# Patient Record
Sex: Male | Born: 1945
Health system: Southern US, Community
[De-identification: ages and names within clinical notes are randomized; demographics above are authoritative.]

## PROBLEM LIST (undated history)

## (undated) DIAGNOSIS — G43009 Migraine without aura, not intractable, without status migrainosus: Secondary | ICD-10-CM

## (undated) DIAGNOSIS — M7989 Other specified soft tissue disorders: Secondary | ICD-10-CM

## (undated) DIAGNOSIS — N320 Bladder-neck obstruction: Secondary | ICD-10-CM

## (undated) DIAGNOSIS — Z86010 Personal history of colonic polyps: Secondary | ICD-10-CM

## (undated) DIAGNOSIS — I517 Cardiomegaly: Secondary | ICD-10-CM

## (undated) DIAGNOSIS — D751 Secondary polycythemia: Secondary | ICD-10-CM

## (undated) DIAGNOSIS — R5383 Other fatigue: Secondary | ICD-10-CM

## (undated) DIAGNOSIS — R5381 Other malaise: Secondary | ICD-10-CM

## (undated) DIAGNOSIS — R7302 Impaired glucose tolerance (oral): Secondary | ICD-10-CM

## (undated) DIAGNOSIS — M79609 Pain in unspecified limb: Secondary | ICD-10-CM

## (undated) DIAGNOSIS — E785 Hyperlipidemia, unspecified: Secondary | ICD-10-CM

## (undated) DIAGNOSIS — M1990 Unspecified osteoarthritis, unspecified site: Secondary | ICD-10-CM

## (undated) DIAGNOSIS — N183 Chronic kidney disease, stage 3 (moderate): Secondary | ICD-10-CM

## (undated) DIAGNOSIS — F528 Other sexual dysfunction not due to a substance or known physiological condition: Secondary | ICD-10-CM

## (undated) DIAGNOSIS — D759 Disease of blood and blood-forming organs, unspecified: Secondary | ICD-10-CM

## (undated) DIAGNOSIS — I10 Essential (primary) hypertension: Secondary | ICD-10-CM

## (undated) DIAGNOSIS — K219 Gastro-esophageal reflux disease without esophagitis: Secondary | ICD-10-CM

## (undated) DIAGNOSIS — R351 Nocturia: Secondary | ICD-10-CM

## (undated) HISTORY — PX: APPENDECTOMY: SHX54

## (undated) HISTORY — DX: Essential (primary) hypertension: I10

## (undated) HISTORY — DX: Personal history of colonic polyps: Z86.010

## (undated) HISTORY — DX: Hyperlipidemia, unspecified: E78.5

## (undated) HISTORY — DX: Bladder-neck obstruction: N32.0

## (undated) HISTORY — PX: COLONOSCOPY: SHX174

## (undated) HISTORY — DX: Other fatigue: R53.83

## (undated) HISTORY — DX: Secondary polycythemia: D75.1

## (undated) HISTORY — DX: Other specified soft tissue disorders: M79.89

## (undated) HISTORY — PX: CIRCUMCISION: SUR203

## (undated) HISTORY — DX: Impaired glucose tolerance (oral): R73.02

## (undated) HISTORY — DX: Other sexual dysfunction not due to a substance or known physiological condition: F52.8

## (undated) HISTORY — DX: Other malaise: R53.81

## (undated) HISTORY — DX: Cardiomegaly: I51.7

## (undated) HISTORY — DX: Pain in unspecified limb: M79.609

## (undated) HISTORY — DX: Nocturia: R35.1

## (undated) HISTORY — DX: Unspecified osteoarthritis, unspecified site: M19.90

## (undated) HISTORY — DX: Chronic kidney disease, stage 3 (moderate): N18.3

## (undated) HISTORY — PX: TONSILLECTOMY: SUR1361

## (undated) HISTORY — PX: UPPER GASTROINTESTINAL ENDOSCOPY: SHX188

## (undated) HISTORY — DX: Disease of blood and blood-forming organs, unspecified: D75.9

## (undated) HISTORY — DX: Migraine without aura, not intractable, without status migrainosus: G43.009

## (undated) HISTORY — PX: POLYPECTOMY: SHX149

## (undated) HISTORY — DX: Gastro-esophageal reflux disease without esophagitis: K21.9

## (undated) HISTORY — PX: OTHER SURGICAL HISTORY: SHX169

---

## 1999-02-17 ENCOUNTER — Emergency Department (HOSPITAL_COMMUNITY): Admission: EM | Admit: 1999-02-17 | Discharge: 1999-02-17 | Payer: Self-pay | Admitting: Emergency Medicine

## 1999-02-17 ENCOUNTER — Encounter: Payer: Self-pay | Admitting: Emergency Medicine

## 2003-06-28 ENCOUNTER — Encounter: Payer: Self-pay | Admitting: General Surgery

## 2003-06-30 ENCOUNTER — Ambulatory Visit (HOSPITAL_COMMUNITY): Admission: RE | Admit: 2003-06-30 | Discharge: 2003-06-30 | Payer: Self-pay | Admitting: General Surgery

## 2004-08-15 ENCOUNTER — Ambulatory Visit: Payer: Self-pay | Admitting: Internal Medicine

## 2004-08-23 ENCOUNTER — Ambulatory Visit: Payer: Self-pay | Admitting: Internal Medicine

## 2004-08-24 ENCOUNTER — Ambulatory Visit: Payer: Self-pay | Admitting: Internal Medicine

## 2005-01-09 ENCOUNTER — Ambulatory Visit: Payer: Self-pay | Admitting: Internal Medicine

## 2007-03-04 ENCOUNTER — Ambulatory Visit: Payer: Self-pay | Admitting: Internal Medicine

## 2007-03-04 LAB — CONVERTED CEMR LAB
ALT: 27 units/L (ref 0–40)
AST: 23 units/L (ref 0–37)
Albumin: 4.2 g/dL (ref 3.5–5.2)
Alkaline Phosphatase: 61 units/L (ref 39–117)
BUN: 11 mg/dL (ref 6–23)
Basophils Absolute: 0 10*3/uL (ref 0.0–0.1)
Basophils Relative: 0.5 % (ref 0.0–1.0)
Bilirubin Urine: NEGATIVE
Bilirubin, Direct: 0.1 mg/dL (ref 0.0–0.3)
CO2: 30 meq/L (ref 19–32)
Calcium: 9.8 mg/dL (ref 8.4–10.5)
Chloride: 109 meq/L (ref 96–112)
Cholesterol: 243 mg/dL (ref 0–200)
Creatinine, Ser: 1.1 mg/dL (ref 0.4–1.5)
Direct LDL: 134 mg/dL
Eosinophils Absolute: 0.3 10*3/uL (ref 0.0–0.6)
Eosinophils Relative: 3.1 % (ref 0.0–5.0)
GFR calc Af Amer: 87 mL/min
GFR calc non Af Amer: 72 mL/min
Glucose, Bld: 100 mg/dL — ABNORMAL HIGH (ref 70–99)
HCT: 41.6 % (ref 39.0–52.0)
HDL: 53.3 mg/dL (ref >39.0)
Hemoglobin: 14 g/dL (ref 13.0–17.0)
Ketones, ur: NEGATIVE mg/dL
Leukocytes, UA: NEGATIVE
Lymphocytes Relative: 21.9 % (ref 12.0–46.0)
MCHC: 33.6 g/dL (ref 30.0–36.0)
MCV: 82.3 fL (ref 78.0–100.0)
Monocytes Absolute: 0.6 10*3/uL (ref 0.2–0.7)
Monocytes Relative: 6.4 % (ref 3.0–11.0)
Neutro Abs: 6.6 10*3/uL (ref 1.4–7.7)
Neutrophils Relative %: 68.1 % (ref 43.0–77.0)
Nitrite: NEGATIVE
PSA: 0.72 ng/mL (ref 0.10–4.00)
Platelets: 506 10*3/uL — ABNORMAL HIGH (ref 150–400)
Potassium: 4 meq/L (ref 3.5–5.1)
RBC: 5.06 M/uL (ref 4.22–5.81)
RDW: 14.9 % — ABNORMAL HIGH (ref 11.5–14.6)
Sodium: 141 meq/L (ref 135–145)
Specific Gravity, Urine: >=1.03 (ref 1.000–1.03)
TSH: 1.8 microintl units/mL (ref 0.35–5.50)
Total Bilirubin: 0.5 mg/dL (ref 0.3–1.2)
Total CHOL/HDL Ratio: 4.6
Total Protein, Urine: NEGATIVE mg/dL
Total Protein: 7.2 g/dL (ref 6.0–8.3)
Triglycerides: 264 mg/dL (ref 0–149)
Urine Glucose: NEGATIVE mg/dL
Urobilinogen, UA: 0.2 (ref 0.0–1.0)
VLDL: 53 mg/dL — ABNORMAL HIGH (ref 0–40)
WBC: 9.6 10*3/uL (ref 4.5–10.5)
pH: 6 (ref 5.0–8.0)

## 2008-11-15 ENCOUNTER — Ambulatory Visit: Payer: Self-pay | Admitting: Internal Medicine

## 2008-11-15 DIAGNOSIS — R5381 Other malaise: Secondary | ICD-10-CM

## 2008-11-15 DIAGNOSIS — F528 Other sexual dysfunction not due to a substance or known physiological condition: Secondary | ICD-10-CM | POA: Insufficient documentation

## 2008-11-15 DIAGNOSIS — E785 Hyperlipidemia, unspecified: Secondary | ICD-10-CM

## 2008-11-15 DIAGNOSIS — K219 Gastro-esophageal reflux disease without esophagitis: Secondary | ICD-10-CM

## 2008-11-15 DIAGNOSIS — R5383 Other fatigue: Secondary | ICD-10-CM

## 2008-11-15 HISTORY — DX: Gastro-esophageal reflux disease without esophagitis: K21.9

## 2008-11-15 HISTORY — DX: Other sexual dysfunction not due to a substance or known physiological condition: F52.8

## 2008-11-15 HISTORY — DX: Hyperlipidemia, unspecified: E78.5

## 2008-11-15 HISTORY — DX: Other malaise: R53.81

## 2008-12-19 ENCOUNTER — Ambulatory Visit: Payer: Self-pay | Admitting: Internal Medicine

## 2008-12-19 ENCOUNTER — Ambulatory Visit: Payer: Self-pay | Admitting: Gastroenterology

## 2008-12-19 DIAGNOSIS — D759 Disease of blood and blood-forming organs, unspecified: Secondary | ICD-10-CM

## 2008-12-19 DIAGNOSIS — R351 Nocturia: Secondary | ICD-10-CM

## 2008-12-19 DIAGNOSIS — D473 Essential (hemorrhagic) thrombocythemia: Secondary | ICD-10-CM

## 2008-12-19 HISTORY — DX: Disease of blood and blood-forming organs, unspecified: D75.9

## 2008-12-19 HISTORY — DX: Nocturia: R35.1

## 2008-12-20 ENCOUNTER — Encounter: Payer: Self-pay | Admitting: Internal Medicine

## 2008-12-20 LAB — CONVERTED CEMR LAB
ALT: 24 units/L (ref 0–53)
AST: 21 units/L (ref 0–37)
Albumin: 4.4 g/dL (ref 3.5–5.2)
Alkaline Phosphatase: 53 units/L (ref 39–117)
Basophils Absolute: 0.1 10*3/uL (ref 0.0–0.1)
Basophils Relative: 0.6 % (ref 0.0–3.0)
Bilirubin Urine: NEGATIVE
Bilirubin, Direct: 0.1 mg/dL (ref 0.0–0.3)
Cholesterol: 189 mg/dL (ref 0–200)
Eosinophils Absolute: 0.3 10*3/uL (ref 0.0–0.7)
Eosinophils Relative: 2.9 % (ref 0.0–5.0)
HCT: 43.6 % (ref 39.0–52.0)
HDL: 57.2 mg/dL (ref >39.00)
Hemoglobin, Urine: NEGATIVE
Hemoglobin: 14.4 g/dL (ref 13.0–17.0)
Ketones, ur: NEGATIVE mg/dL
LDL Cholesterol: 108 mg/dL — ABNORMAL HIGH (ref 0–99)
Leukocytes, UA: NEGATIVE
Lymphocytes Relative: 22.3 % (ref 12.0–46.0)
Lymphs Abs: 2.1 10*3/uL (ref 0.7–4.0)
MCHC: 33 g/dL (ref 30.0–36.0)
MCV: 84.6 fL (ref 78.0–100.0)
Monocytes Absolute: 0.6 10*3/uL (ref 0.1–1.0)
Monocytes Relative: 6.3 % (ref 3.0–12.0)
Neutro Abs: 6.5 10*3/uL (ref 1.4–7.7)
Neutrophils Relative %: 67.9 % (ref 43.0–77.0)
Nitrite: NEGATIVE
Platelets: 631 10*3/uL — ABNORMAL HIGH (ref 150.0–400.0)
RBC: 5.15 M/uL (ref 4.22–5.81)
RDW: 15.4 % — ABNORMAL HIGH (ref 11.5–14.6)
Specific Gravity, Urine: 1.01 (ref 1.000–1.030)
Total Bilirubin: 0.8 mg/dL (ref 0.3–1.2)
Total CHOL/HDL Ratio: 3
Total Protein, Urine: NEGATIVE mg/dL
Total Protein: 7.4 g/dL (ref 6.0–8.3)
Triglycerides: 119 mg/dL (ref 0.0–149.0)
Urine Glucose: NEGATIVE mg/dL
Urobilinogen, UA: 0.2 (ref 0.0–1.0)
VLDL: 23.8 mg/dL (ref 0.0–40.0)
WBC: 9.6 10*3/uL (ref 4.5–10.5)
pH: 6 (ref 5.0–8.0)

## 2008-12-22 ENCOUNTER — Ambulatory Visit: Payer: Self-pay | Admitting: Oncology

## 2008-12-23 ENCOUNTER — Encounter: Payer: Self-pay | Admitting: Internal Medicine

## 2008-12-23 LAB — CBC WITH DIFFERENTIAL/PLATELET
BASO%: 0.4 % (ref 0.0–2.0)
LYMPH%: 21.7 % (ref 14.0–49.0)
MCHC: 32.3 g/dL (ref 32.0–36.0)
MONO#: 0.6 10*3/uL (ref 0.1–0.9)
Platelets: 703 10*3/uL — ABNORMAL HIGH (ref 140–400)
RBC: 5.14 10*6/uL (ref 4.20–5.82)
RDW: 16.6 % — ABNORMAL HIGH (ref 11.0–14.6)
WBC: 10.6 10*3/uL — ABNORMAL HIGH (ref 4.0–10.3)
lymph#: 2.3 10*3/uL (ref 0.9–3.3)

## 2008-12-23 LAB — CHCC SMEAR

## 2008-12-28 LAB — COMPREHENSIVE METABOLIC PANEL
ALT: 25 U/L (ref 0–53)
AST: 21 U/L (ref 0–37)
Calcium: 10.3 mg/dL (ref 8.4–10.5)
Chloride: 102 mEq/L (ref 96–112)
Creatinine, Ser: 0.96 mg/dL (ref 0.40–1.50)

## 2009-01-02 ENCOUNTER — Encounter: Payer: Self-pay | Admitting: Gastroenterology

## 2009-01-02 ENCOUNTER — Encounter: Payer: Self-pay | Admitting: Internal Medicine

## 2009-01-02 ENCOUNTER — Ambulatory Visit: Payer: Self-pay | Admitting: Gastroenterology

## 2009-01-02 LAB — HM COLONOSCOPY

## 2009-01-05 ENCOUNTER — Encounter: Payer: Self-pay | Admitting: Gastroenterology

## 2009-01-31 ENCOUNTER — Ambulatory Visit: Payer: Self-pay | Admitting: Oncology

## 2009-02-02 ENCOUNTER — Encounter: Payer: Self-pay | Admitting: Internal Medicine

## 2009-02-02 LAB — CBC WITH DIFFERENTIAL/PLATELET
Basophils Absolute: 0 10*3/uL (ref 0.0–0.1)
Eosinophils Absolute: 0.3 10*3/uL (ref 0.0–0.5)
HGB: 14.6 g/dL (ref 13.0–17.1)
LYMPH%: 21.9 % (ref 14.0–49.0)
MCH: 27.6 pg (ref 27.2–33.4)
MCV: 84.7 fL (ref 79.3–98.0)
MONO%: 4.7 % (ref 0.0–14.0)
NEUT#: 7.1 10*3/uL — ABNORMAL HIGH (ref 1.5–6.5)
Platelets: 710 10*3/uL — ABNORMAL HIGH (ref 140–400)
RBC: 5.3 10*6/uL (ref 4.20–5.82)

## 2009-02-02 LAB — IRON AND TIBC
%SAT: 19 % — ABNORMAL LOW (ref 20–55)
TIBC: 303 ug/dL (ref 215–435)
UIBC: 245 ug/dL

## 2009-02-02 LAB — COMPREHENSIVE METABOLIC PANEL
Alkaline Phosphatase: 55 U/L (ref 39–117)
BUN: 12 mg/dL (ref 6–23)
Glucose, Bld: 95 mg/dL (ref 70–99)
Total Bilirubin: 0.5 mg/dL (ref 0.3–1.2)

## 2009-02-02 LAB — FERRITIN: Ferritin: 119 ng/mL (ref 22–322)

## 2009-04-11 ENCOUNTER — Ambulatory Visit: Payer: Self-pay | Admitting: Oncology

## 2009-04-14 ENCOUNTER — Encounter: Payer: Self-pay | Admitting: Internal Medicine

## 2009-04-14 LAB — CBC WITH DIFFERENTIAL/PLATELET
Basophils Absolute: 0 10*3/uL (ref 0.0–0.1)
Eosinophils Absolute: 0.3 10*3/uL (ref 0.0–0.5)
HCT: 45.3 % (ref 38.4–49.9)
HGB: 14.6 g/dL (ref 13.0–17.1)
MCV: 84.1 fL (ref 79.3–98.0)
MONO%: 5.8 % (ref 0.0–14.0)
NEUT#: 7.2 10*3/uL — ABNORMAL HIGH (ref 1.5–6.5)
NEUT%: 69.5 % (ref 39.0–75.0)
RDW: 15.4 % — ABNORMAL HIGH (ref 11.0–14.6)

## 2009-10-06 ENCOUNTER — Ambulatory Visit: Payer: Self-pay | Admitting: Oncology

## 2009-10-19 ENCOUNTER — Encounter: Payer: Self-pay | Admitting: Internal Medicine

## 2009-10-19 LAB — CBC WITH DIFFERENTIAL/PLATELET
Basophils Absolute: 0 10*3/uL (ref 0.0–0.1)
Eosinophils Absolute: 0.3 10*3/uL (ref 0.0–0.5)
HGB: 16 g/dL (ref 13.0–17.1)
MCV: 86.3 fL (ref 79.3–98.0)
MONO#: 0.5 10*3/uL (ref 0.1–0.9)
MONO%: 4.1 % (ref 0.0–14.0)
NEUT#: 9.9 10*3/uL — ABNORMAL HIGH (ref 1.5–6.5)
RBC: 5.73 10*6/uL (ref 4.20–5.82)
RDW: 16.4 % — ABNORMAL HIGH (ref 11.0–14.6)
WBC: 12.6 10*3/uL — ABNORMAL HIGH (ref 4.0–10.3)
lymph#: 1.8 10*3/uL (ref 0.9–3.3)

## 2009-11-20 ENCOUNTER — Ambulatory Visit: Payer: Self-pay | Admitting: Oncology

## 2009-11-20 ENCOUNTER — Ambulatory Visit (HOSPITAL_COMMUNITY): Admission: RE | Admit: 2009-11-20 | Discharge: 2009-11-20 | Payer: Self-pay | Admitting: Oncology

## 2009-11-21 ENCOUNTER — Ambulatory Visit: Payer: Self-pay | Admitting: Oncology

## 2009-11-24 ENCOUNTER — Encounter: Payer: Self-pay | Admitting: Internal Medicine

## 2009-11-29 ENCOUNTER — Ambulatory Visit: Payer: Self-pay | Admitting: Internal Medicine

## 2010-02-26 ENCOUNTER — Ambulatory Visit: Payer: Self-pay | Admitting: Oncology

## 2010-02-28 ENCOUNTER — Encounter: Payer: Self-pay | Admitting: Internal Medicine

## 2010-02-28 LAB — COMPREHENSIVE METABOLIC PANEL
Albumin: 4.5 g/dL (ref 3.5–5.2)
Alkaline Phosphatase: 57 U/L (ref 39–117)
BUN: 9 mg/dL (ref 6–23)
CO2: 29 mEq/L (ref 19–32)
Calcium: 9.9 mg/dL (ref 8.4–10.5)
Chloride: 101 mEq/L (ref 96–112)
Glucose, Bld: 96 mg/dL (ref 70–99)
Potassium: 4.1 mEq/L (ref 3.5–5.3)
Sodium: 136 mEq/L (ref 135–145)
Total Protein: 7.8 g/dL (ref 6.0–8.3)

## 2010-02-28 LAB — CBC WITH DIFFERENTIAL/PLATELET
Basophils Absolute: 0 10*3/uL (ref 0.0–0.1)
Eosinophils Absolute: 0.3 10*3/uL (ref 0.0–0.5)
HGB: 15.8 g/dL (ref 13.0–17.1)
MCV: 84.8 fL (ref 79.3–98.0)
MONO#: 0.7 10*3/uL (ref 0.1–0.9)
MONO%: 6.1 % (ref 0.0–14.0)
NEUT#: 8.1 10*3/uL — ABNORMAL HIGH (ref 1.5–6.5)
RBC: 5.71 10*6/uL (ref 4.20–5.82)
RDW: 16.5 % — ABNORMAL HIGH (ref 11.0–14.6)
WBC: 11.7 10*3/uL — ABNORMAL HIGH (ref 4.0–10.3)

## 2010-06-19 ENCOUNTER — Ambulatory Visit: Payer: Self-pay | Admitting: Oncology

## 2010-06-21 ENCOUNTER — Encounter: Payer: Self-pay | Admitting: Internal Medicine

## 2010-06-21 LAB — CBC WITH DIFFERENTIAL/PLATELET
BASO%: 0.2 % (ref 0.0–2.0)
Basophils Absolute: 0 10*3/uL (ref 0.0–0.1)
EOS%: 3.4 % (ref 0.0–7.0)
Eosinophils Absolute: 0.4 10*3/uL (ref 0.0–0.5)
HCT: 48.3 % (ref 38.4–49.9)
HGB: 15.7 g/dL (ref 13.0–17.1)
LYMPH%: 25 % (ref 14.0–49.0)
MCH: 27.8 pg (ref 27.2–33.4)
MCHC: 32.5 g/dL (ref 32.0–36.0)
MCV: 85.6 fL (ref 79.3–98.0)
MONO#: 0.8 10*3/uL (ref 0.1–0.9)
MONO%: 6.6 % (ref 0.0–14.0)
NEUT#: 8.1 10*3/uL — ABNORMAL HIGH (ref 1.5–6.5)
NEUT%: 64.8 % (ref 39.0–75.0)
Platelets: 682 10*3/uL — ABNORMAL HIGH (ref 140–400)
RBC: 5.64 10*6/uL (ref 4.20–5.82)
RDW: 15.8 % — ABNORMAL HIGH (ref 11.0–14.6)
WBC: 12.5 10*3/uL — ABNORMAL HIGH (ref 4.0–10.3)
lymph#: 3.1 10*3/uL (ref 0.9–3.3)
nRBC: 0 % (ref 0–0)

## 2010-06-21 LAB — COMPREHENSIVE METABOLIC PANEL
Albumin: 4.1 g/dL (ref 3.5–5.2)
Alkaline Phosphatase: 60 U/L (ref 39–117)
BUN: 18 mg/dL (ref 6–23)
CO2: 29 mEq/L (ref 19–32)
Glucose, Bld: 92 mg/dL (ref 70–99)
Potassium: 4.2 mEq/L (ref 3.5–5.3)
Total Bilirubin: 1.2 mg/dL (ref 0.3–1.2)

## 2010-07-05 ENCOUNTER — Encounter: Payer: Self-pay | Admitting: Internal Medicine

## 2010-07-09 ENCOUNTER — Encounter: Payer: Self-pay | Admitting: Internal Medicine

## 2010-07-30 ENCOUNTER — Telehealth: Payer: Self-pay | Admitting: Internal Medicine

## 2010-08-06 ENCOUNTER — Ambulatory Visit: Payer: Self-pay | Admitting: Internal Medicine

## 2010-08-06 DIAGNOSIS — G43009 Migraine without aura, not intractable, without status migrainosus: Secondary | ICD-10-CM

## 2010-08-06 HISTORY — DX: Migraine without aura, not intractable, without status migrainosus: G43.009

## 2010-10-14 LAB — CONVERTED CEMR LAB
ALT: 19 units/L (ref 0–53)
ALT: 34 units/L (ref 0–53)
AST: 18 units/L (ref 0–37)
AST: 25 units/L (ref 0–37)
Albumin: 4.3 g/dL (ref 3.5–5.2)
Albumin: 4.4 g/dL (ref 3.5–5.2)
Alkaline Phosphatase: 53 units/L (ref 39–117)
Alkaline Phosphatase: 67 units/L (ref 39–117)
BUN: 10 mg/dL (ref 6–23)
BUN: 10 mg/dL (ref 6–23)
Basophils Absolute: 0 10*3/uL (ref 0.0–0.1)
Basophils Absolute: 0 10*3/uL (ref 0.0–0.1)
Basophils Relative: 0.1 % (ref 0.0–3.0)
Basophils Relative: 0.3 % (ref 0.0–3.0)
Bilirubin Urine: NEGATIVE
Bilirubin, Direct: 0.1 mg/dL (ref 0.0–0.3)
Bilirubin, Direct: 0.1 mg/dL (ref 0.0–0.3)
CO2: 29 meq/L (ref 19–32)
CO2: 31 meq/L (ref 19–32)
Calcium: 9.1 mg/dL (ref 8.4–10.5)
Calcium: 9.6 mg/dL (ref 8.4–10.5)
Chloride: 101 meq/L (ref 96–112)
Chloride: 108 meq/L (ref 96–112)
Cholesterol: 148 mg/dL (ref 0–200)
Cholesterol: 200 mg/dL (ref 0–200)
Creatinine, Ser: 0.9 mg/dL (ref 0.4–1.5)
Creatinine, Ser: 1 mg/dL (ref 0.4–1.5)
Eosinophils Absolute: 0.3 10*3/uL (ref 0.0–0.7)
Eosinophils Absolute: 0.3 10*3/uL (ref 0.0–0.7)
Eosinophils Relative: 2.6 % (ref 0.0–5.0)
Eosinophils Relative: 3.2 % (ref 0.0–5.0)
Folate: 7.7 ng/mL
GFR calc Af Amer: 109 mL/min
GFR calc non Af Amer: 90 mL/min
GFR calc non Af Amer: 96.1 mL/min (ref >60)
Glucose, Bld: 101 mg/dL — ABNORMAL HIGH (ref 70–99)
Glucose, Bld: 92 mg/dL (ref 70–99)
HCT: 44.1 % (ref 39.0–52.0)
HCT: 48.6 % (ref 39.0–52.0)
HDL: 57.4 mg/dL (ref >39.0)
HDL: 63.7 mg/dL (ref >39.00)
Hemoglobin: 14.6 g/dL (ref 13.0–17.0)
Hemoglobin: 15.4 g/dL (ref 13.0–17.0)
Iron: 43 ug/dL (ref 42–165)
Ketones, ur: NEGATIVE mg/dL
LDL Cholesterol: 123 mg/dL — ABNORMAL HIGH (ref 0–99)
LDL Cholesterol: 63 mg/dL (ref 0–99)
Leukocytes, UA: NEGATIVE
Lymphocytes Relative: 12.9 % (ref 12.0–46.0)
Lymphocytes Relative: 27.2 % (ref 12.0–46.0)
Lymphs Abs: 1.4 10*3/uL (ref 0.7–4.0)
MCHC: 31.7 g/dL (ref 30.0–36.0)
MCHC: 33.1 g/dL (ref 30.0–36.0)
MCV: 84.4 fL (ref 78.0–100.0)
MCV: 86.3 fL (ref 78.0–100.0)
Monocytes Absolute: 0.5 10*3/uL (ref 0.1–1.0)
Monocytes Absolute: 0.9 10*3/uL (ref 0.1–1.0)
Monocytes Relative: 5.7 % (ref 3.0–12.0)
Monocytes Relative: 8.8 % (ref 3.0–12.0)
Neutro Abs: 6 10*3/uL (ref 1.4–7.7)
Neutro Abs: 8.1 10*3/uL — ABNORMAL HIGH (ref 1.4–7.7)
Neutrophils Relative %: 63.6 % (ref 43.0–77.0)
Neutrophils Relative %: 75.6 % (ref 43.0–77.0)
Nitrite: NEGATIVE
PSA: 0.83 ng/mL (ref 0.10–4.00)
PSA: 1.82 ng/mL (ref 0.10–4.00)
Platelets: 640 10*3/uL — ABNORMAL HIGH (ref 150.0–400.0)
Platelets: 642 10*3/uL — ABNORMAL HIGH (ref 150–400)
Potassium: 4.1 meq/L (ref 3.5–5.1)
Potassium: 4.5 meq/L (ref 3.5–5.1)
RBC: 5.22 M/uL (ref 4.22–5.81)
RBC: 5.63 M/uL (ref 4.22–5.81)
RDW: 15.9 % — ABNORMAL HIGH (ref 11.5–14.6)
RDW: 15.9 % — ABNORMAL HIGH (ref 11.5–14.6)
Saturation Ratios: 12.8 % — ABNORMAL LOW (ref 20.0–50.0)
Sed Rate: 2 mm/hr (ref 0–22)
Sodium: 138 meq/L (ref 135–145)
Sodium: 141 meq/L (ref 135–145)
Specific Gravity, Urine: >=1.03 (ref 1.000–1.030)
TSH: 1.95 microintl units/mL (ref 0.35–5.50)
TSH: 1.98 microintl units/mL (ref 0.35–5.50)
Total Bilirubin: 0.4 mg/dL (ref 0.3–1.2)
Total Bilirubin: 0.8 mg/dL (ref 0.3–1.2)
Total CHOL/HDL Ratio: 2
Total CHOL/HDL Ratio: 3.5
Total Protein, Urine: NEGATIVE mg/dL
Total Protein: 7.6 g/dL (ref 6.0–8.3)
Total Protein: 8.2 g/dL (ref 6.0–8.3)
Transferrin: 240.4 mg/dL (ref 212.0–360.0)
Triglycerides: 108 mg/dL (ref 0.0–149.0)
Triglycerides: 97 mg/dL (ref 0–149)
Urine Glucose: NEGATIVE mg/dL
Urobilinogen, UA: 0.2 (ref 0.0–1.0)
VLDL: 19 mg/dL (ref 0–40)
VLDL: 21.6 mg/dL (ref 0.0–40.0)
Vitamin B-12: 244 pg/mL (ref 211–911)
WBC: 10.7 10*3/uL — ABNORMAL HIGH (ref 4.5–10.5)
WBC: 9.3 10*3/uL (ref 4.5–10.5)
pH: 5 (ref 5.0–8.0)

## 2010-10-18 NOTE — Letter (Signed)
[facility]   Imported By: Lester [place] 11/01/2009 08:11:38  _____________________________________________________________________  External Attachment:    Type:   Image     Comment:   External Document

## 2010-10-18 NOTE — Assessment & Plan Note (Signed)
Summary: YEARLY FU/MEDICARE/TO COME FASTING/NWS  #   Vital Signs:  Patient profile:   65 year old male Height:      69 inches Weight:      185.13 pounds BMI:     27.44 O2 Sat:      98 % on Room air Temp:     98.1 degrees F oral Pulse rate:   83 / minute BP sitting:   142 / 80  (left arm) Cuff size:   large  Vitals Entered ByZella Ball Ewing (November 29, 2009 10:31 AM)  O2 Flow:  Room air  CC: Yearly/RE   CC:  Yearly/RE.  History of Present Illness: BP at home and elsewhere usually < 140/90 per pt;  overall doing well,  Pt denies CP, sob, doe, wheezing, orthopnea, pnd, worsening LE edema, palps, dizziness or syncope   Pt denies new neuro symptoms such as headache, facial or extremity weakness  Did have some discomfort near the left scapula this am with getting up at first , a catch that seems better now.  No other significant shoulder pain or neck pain.  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    Here for wellness Diet: Heart Healthy or DM if diabetic Physical Activities: Sedentary Depression/mood screen: Negative Hearing: Intact bilateral Visual Acuity: Grossly normal ADL's: Capable  Fall Risk: None Home Safety: Good End-of-Life Planning: Advance directive - Full code/I agree   Preventive Screening-Counseling & Management      Drug Use:  no.    Problems Prior to Update: 1)  Nocturia  (ICD-788.43) 2)  Hepatotoxicity, Drug-induced, Risk of  (ICD-V58.69) 3)  Thrombocytosis  (ICD-289.9) 4)  Erectile Dysfunction  (ICD-302.72) 5)  Health Screening  (ICD-V70.0) 6)  Special Screening Malig Neoplasms Other Sites  (ICD-V76.49) 7)  Fatigue  (ICD-780.79) 8)  Gerd  (ICD-530.81) 9)  Hyperlipidemia  (ICD-272.4)  Medications Prior to Update: 1)  Cialis 20 Mg Tabs (Tadalafil) .Marland Kitchen.. 1po Once Daily As Needed 2)  Adult Aspirin Ec Low Strength 81 Mg Tbec (Aspirin) .Marland Kitchen.. 1po Once Daily 3)  Lovastatin 40 Mg Tabs (Lovastatin) .Marland Kitchen.. 1`  By Mouth Once Daily 4)  Flomax 0.4 Mg Xr24h-Cap (Tamsulosin Hcl) .Marland Kitchen.. 1 By Mouth Once Daily (Generic)  Current Medications (verified): 1)  Cialis 20 Mg Tabs (Tadalafil) .Marland Kitchen.. 1po Once Daily As Needed 2)  Adult Aspirin Ec Low Strength 81 Mg Tbec (Aspirin) .Marland Kitchen.. 1po Once Daily 3)  Lovastatin 40 Mg Tabs (Lovastatin) .Marland Kitchen.. 1 By Mouth Once Daily 4)  Flomax 0.4 Mg Xr24h-Cap (Tamsulosin Hcl) .Marland Kitchen.. 1 By Mouth Once Daily (Generic)  Allergies (verified): No Known Drug Allergies  Past History:  Past Medical History: Last updated: 11/15/2008 Hyperlipidemia E.D. 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         all otherwise negative per pt -  except for mild fatigue without osa symptoms or other constitutional symtpoms, no recent reflux , dysphagai, n/v, wt loss, abd pain  Physical Exam  General:  alert and  well-developed.   Head:  normocephalic and atraumatic.   Eyes:  vision grossly intact, pupils equal, and pupils round.   Ears:  R ear normal and L ear normal.   Nose:  no external deformity and no nasal discharge.   Mouth:  no gingival abnormalities and pharynx pink and moist.   Neck:  supple and no masses.   Lungs:  normal respiratory effort and normal breath sounds.   Heart:  normal rate and regular rhythm.   Abdomen:  soft, non-tender, and normal bowel sounds.   Msk:  no joint tenderness and no joint swelling.  , no significant periscapular tender on the left Extremities:  no edema, no erythema  Neurologic:  cranial nerves II-XII intact and strength normal in all extremities.   Skin:  color normal and no rashes.     Impression & Recommendations:  Problem # 1:  Preventive Health Care (ICD-V70.0)  Overall doing well, age appropriate education and counseling updated and referral for appropriate preventive services done unless declined, immunizations up to date or declined, diet counseling done if overweight, urged to quit smoking if smokes , most recent labs reviewed and current ordered if appropriate, ecg reviewed or declined (interpretation per ECG scanned in the EMR if done); information regarding Medicare Prevention requirements given if appropriate   Orders: First annual wellness visit with prevention plan  (Z3086)  Problem # 2:  FATIGUE (ICD-780.79)  mild, exam benign, to check labs below; follow with expectant management   Orders: EKG w/ Interpretation (93000) TLB-BMP (Basic Metabolic Panel-BMET) (80048-METABOL) TLB-CBC Platelet - w/Differential (85025-CBCD) TLB-Hepatic/Liver Function Pnl (80076-HEPATIC) TLB-TSH (Thyroid Stimulating Hormone) (84443-TSH) TLB-Sedimentation Rate (ESR) (85652-ESR) TLB-IBC Pnl (Iron/FE;Transferrin) (83550-IBC) TLB-B12 + Folate Pnl (57846_96295-M84/XLK) TLB-Udip ONLY (81003-UDIP)  Problem # 3:  HYPERLIPIDEMIA (ICD-272.4)  His updated  medication list for this problem includes:    Lovastatin 40 Mg Tabs (Lovastatin) .Marland Kitchen... 1 by mouth once daily  Orders: TLB-Lipid Panel (80061-LIPID)  Labs Reviewed: SGOT: 21 (12/19/2008)   SGPT: 24 (12/19/2008)   HDL:57.20 (12/19/2008), 57.4 (11/15/2008)  LDL:108 (12/19/2008), 123 (11/15/2008)  Chol:189 (12/19/2008), 200 (11/15/2008)  Trig:119.0 (12/19/2008), 97 (11/15/2008) stable overall by hx and exam, ok to continue meds/tx as is , follow diet closely  Problem # 4:  ERECTILE DYSFUNCTION (ICD-302.72)  His updated medication list for this problem includes:    Cialis 20 Mg Tabs (Tadalafil) .Marland Kitchen... 1po once daily as needed stable overall by hx and exam, ok to continue meds/tx as is , to refill meds  Orders: Prescription Created Electronically [id number])  Problem # 5:  GERD (ICD-530.81) stable overall by hx and exam, ok to continue meds/tx as is = does not require tx at this time  Complete Medication List: 1)  Cialis 20 Mg Tabs (Tadalafil) .Marland Kitchen.. 1po once daily as needed 2)  Adult Aspirin Ec Low Strength 81 Mg Tbec (Aspirin) .Marland Kitchen.. 1po once daily 3)  Lovastatin 40 Mg Tabs (Lovastatin) .Marland Kitchen.. 1 by mouth once daily 4)  Flomax 0.4 Mg Xr24h-cap (Tamsulosin hcl) .Marland Kitchen.. 1 by mouth once daily (generic)  Other Orders: Flu Vaccine 47yrs + (27253) Administration Flu vaccine -  MCR (G0008) TLB-PSA (Prostate Specific Antigen) (84153-PSA)  Patient Instructions: 1)  you had the flu shot today 2)  Your EKG was good today 3)  Please go to the Lab in the basement for your blood and/or urine tests today 4)  Please schedule a follow-up appointment in 1 year or sooner if needed Prescriptions: FLOMAX 0.4 MG XR24H-CAP (TAMSULOSIN HCL) 1 by mouth once daily (GENERIC)  #90 x 3   Entered and Authorized by:   Corwin Levins MD   Signed by:   Corwin Levins MD on 11/29/2009   Method used:   Print then Give to Patient   RxID:   [id number] LOVASTATIN 40 MG TABS (LOVASTATIN) 1 by mouth once daily  #90 x 3   Entered  and Authorized by:   Corwin Levins MD   Signed by:   Corwin Levins MD on 11/29/2009   Method used:   Print then Give to Patient   RxID:   [id number] CIALIS 20 MG TABS (TADALAFIL) 1po once daily as needed  #5 x 11   Entered and Authorized by:   Corwin Levins MD   Signed by:   Corwin Levins MD on 11/29/2009   Method used:   Print then Give to Patient   RxID:   [id number]      Flu Vaccine Consent Questions     Do you have a history of severe allergic reactions to this vaccine? no    Any prior history of allergic reactions to egg and/or gelatin? no    Do you have a sensitivity to the preservative Thimersol? no    Do you have a past history of Guillan-Barre Syndrome? no    Do you currently have an acute febrile illness? no    Have you ever had a severe reaction to latex? no    Vaccine information given and explained to patient? yes    Are you currently pregnant? no    Lot Number:[id number]   Exp Date:03/15/2010   Site Given  Left Deltoid IMflu

## 2010-10-18 NOTE — Letter (Signed)
[facility]   Imported By: Lester [place]on 12/06/2009 09:59:10  _____________________________________________________________________  External Attachment:    Type:   Image     Comment:   External Document

## 2010-10-18 NOTE — Medication Information (Signed)
Summary: Updated - Vaccum Erection Device/Pos-T-Vac  Updated - Vaccum Erection Device/Pos-T-Vac   Imported By: Lennie Odor 07/11/2010 16:16:27  _____________________________________________________________________  External Attachment:    Type:   Image     Comment:   External Document

## 2010-10-18 NOTE — Medication Information (Signed)
Summary: Vaccum Erection Device/Pos-T-Vac  Vaccum Erection Device/Pos-T-Vac   Imported By: Sherian Rein 07/10/2010 11:01:52  _____________________________________________________________________  External Attachment:    Type:   Image     Comment:   External Document

## 2010-10-18 NOTE — Letter (Signed)
[facility]   Imported By: Sherian Rein 03/26/2010 12:15:14  _____________________________________________________________________  External Attachment:    Type:   Image     Comment:   External Document

## 2010-10-18 NOTE — Progress Notes (Signed)
  Phone Note Other Incoming   Caller: [employer] Summary of Call: [facility] [employer] called needing documentation that the Patient has ED in order to be Ok'd by medicare. They will send over a fax to be completed. Called back number [phone]. [phone] phone number [phone]. Initial call taken by: Robin Ewing CMA Duncan Dull),  July 30, 2010 10:40 AM  Follow-up for Phone Call        ok Follow-up by: Corwin Levins MD,  July 30, 2010 12:03 PM

## 2010-10-18 NOTE — Letter (Signed)
Summary: [facility]  [facility]   Imported By: Sherian Rein 07/13/2010 14:04:27  _____________________________________________________________________  External Attachment:    Type:   Image     Comment:   External Document

## 2010-10-18 NOTE — Assessment & Plan Note (Addendum)
Summary: headaches-lb   Vital Signs:  Patient profile:   65 year old male Height:      69 inches Weight:      181.25 pounds BMI:     26.86 O2 Sat:      97 % on Room air Temp:     98.3 degrees F oral Pulse rate:   83 / minute BP sitting:   122 / 72  (left arm) Cuff size:   regular  Vitals Entered By: Zella Ball Ewing CMA Duncan Dull) (August 06, 2010 3:56 PM)  O2 Flow:  Room air  CC: Headaches/RE   CC:  Headaches/RE.  History of Present Illness: here to f/u; overall doing ok.  Pt denies CP, worsening sob, doe, wheezing, orthopnea, pnd, worsening LE edema, palps, dizziness or syncope  Pt denies new neuro symptoms such as facial or extremity weakness, except has midl increaed freq of typical right side migraine type headache, with throbbing, photophobia, nausea and worse to excercise, midl to mod in the past month, but none in the 2 wks.  Each HA amenable to asa, advill or alleve within a few hrs.  Pt denies polydipsia, polyuria   Overall good compliance with meds, trying to follow low chol diet, wt stable, little excercise however , and has run out of his lovastatin - needs refill.  Overall good compliance with meds, and good tolerability. Denies worsening depressive symptoms, suicidal ideation, or panic.   Has occasional reflux, but no cough, hoarseness , wheezing.  Sometimes using otc prilosec, so no n/v, dysphgia, abd pain, bowel change or blood.    Problems Prior to Update: 1)  Preventive Health Care  (ICD-V70.0) 2)  Nocturia  (ICD-788.43) 3)  Hepatotoxicity, Drug-induced, Risk of  (ICD-V58.69) 4)  Thrombocytosis  (ICD-289.9) 5)  Erectile Dysfunction  (ICD-302.72) 6)  Health Screening  (ICD-V70.0) 7)  Special Screening Malig Neoplasms Other Sites  (ICD-V76.49) 8)  Fatigue  (ICD-780.79) 9)  Gerd  (ICD-530.81) 10)  Hyperlipidemia  (ICD-272.4)  Medications Prior to Update: 1)  Cialis 20 Mg Tabs (Tadalafil) .Marland Kitchen.. 1po Once Daily As Needed 2)  Adult Aspirin Ec Low Strength 81 Mg Tbec  (Aspirin) .Marland Kitchen.. 1po Once Daily 3)  Lovastatin 40 Mg Tabs (Lovastatin) .Marland Kitchen.. 1 By Mouth Once Daily 4)  Flomax 0.4 Mg Xr24h-Cap (Tamsulosin Hcl) .Marland Kitchen.. 1 By Mouth Once Daily (Generic)  Current Medications (verified): 1)  Cialis 20 Mg Tabs (Tadalafil) .Marland Kitchen.. 1po Once Daily As Needed 2)  Adult Aspirin Ec Low Strength 81 Mg Tbec (Aspirin) .Marland Kitchen.. 1po Once Daily 3)  Lovastatin 40 Mg Tabs (Lovastatin) .Marland Kitchen.. 1 By Mouth Once Daily 4)  Flomax 0.4 Mg Xr24h-Cap (Tamsulosin Hcl) .Marland Kitchen.. 1 By Mouth Once Daily (Generic)  Allergies (verified): No Known Drug Allergies  Past History:  Past Medical History: Last updated: 11/15/2008 Hyperlipidemia E.D. 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   Head:  normocephalic and atraumatic.   Eyes:  vision grossly intact, pupils equal, and pupils round.   Ears:  R ear normal and L ear normal.   Nose:  no external deformity and no nasal discharge.   Mouth:  no gingival abnormalities and pharynx  pink and moist.   Neck:  supple and no masses.   Lungs:  normal respiratory effort and normal breath sounds.   Heart:  normal rate and regular rhythm.   Extremities:  no edema, no erythema    Impression & Recommendations:  Problem # 1:  HYPERLIPIDEMIA (ICD-272.4)  His updated medication list for this problem includes:    Lovastatin 40 Mg Tabs (Lovastatin) .Marland Kitchen... 1 by mouth once daily  Labs Reviewed: SGOT: 25 (11/29/2009)   SGPT: 34 (11/29/2009)   HDL:63.70 (11/29/2009), 57.20 (12/19/2008)  LDL:63 (11/29/2009), 108 (16/06/9603)  Chol:148 (11/29/2009), 189 (12/19/2008)  Trig:108.0  (11/29/2009), 119.0 (12/19/2008) stable overall by hx and exam, ok to continue meds/tx as is , Pt to continue diet efforts, good med tolerance; to check labs - goal LDL less than 100 , declines labs today as he has been out of the statin  Problem # 2:  MIGRAINE, COMMON (ICD-346.10)  His updated medication list for this problem includes:    Adult Aspirin Ec Low Strength 81 Mg Tbec (Aspirin) .Marland Kitchen... 1po once daily ok to Continue all previous medications as before this visit , exam benign  Problem # 3:  GERD (ICD-530.81)  Labs Reviewed: Hgb: 15.4 (11/29/2009)   Hct: 48.6 (11/29/2009) declines PPI, for anti-reflux precautions - d/w pt , and otc prilosec as needed   Complete Medication List: 1)  Cialis 20 Mg Tabs (Tadalafil) .Marland Kitchen.. 1po once daily as needed 2)  Adult Aspirin Ec Low Strength 81 Mg Tbec (Aspirin) .Marland Kitchen.. 1po once daily 3)  Lovastatin 40 Mg Tabs (Lovastatin) .Marland Kitchen.. 1 by mouth once daily 4)  Flomax 0.4 Mg Xr24h-cap (Tamsulosin hcl) .Marland Kitchen.. 1 by mouth once daily (generic)  Patient Instructions: 1)  Continue all previous medications as before this visit  2)  Please schedule a follow-up appointment in 6 months. Prescriptions: LOVASTATIN 40 MG TABS (LOVASTATIN) 1 by mouth once daily  #90 x 3   Entered and Authorized by:   Corwin Levins MD   Signed by:   Corwin Levins MD on 08/06/2010   Method used:   Electronically to        [facility]  [address] [phone]* (retail)       [address]       [place], [place]  [id number]       Ph: [phone] or [phone]       Fax: [phone]   RxID:   [id number]    Orders Added: 1)  Est. Patient Level IV [95638]  Appended Document: headaches-lb Pt reports that cialis has not been effective recently for ED symptoms.  OK for Vacuum Erection Device.

## 2010-11-05 ENCOUNTER — Encounter: Payer: Self-pay | Admitting: Internal Medicine

## 2010-11-05 ENCOUNTER — Ambulatory Visit (HOSPITAL_COMMUNITY)
Admission: RE | Admit: 2010-11-05 | Discharge: 2010-11-05 | Disposition: A | Discharge status: Home / Self Care | Payer: Medicare Other | Source: Ambulatory Visit | Attending: Internal Medicine | Admitting: Internal Medicine

## 2010-11-05 ENCOUNTER — Ambulatory Visit (INDEPENDENT_AMBULATORY_CARE_PROVIDER_SITE_OTHER): Payer: Medicare Other | Admitting: Internal Medicine

## 2010-11-05 DIAGNOSIS — M7989 Other specified soft tissue disorders: Secondary | ICD-10-CM | POA: Insufficient documentation

## 2010-11-05 DIAGNOSIS — L988 Other specified disorders of the skin and subcutaneous tissue: Secondary | ICD-10-CM | POA: Insufficient documentation

## 2010-11-05 DIAGNOSIS — R599 Enlarged lymph nodes, unspecified: Secondary | ICD-10-CM | POA: Insufficient documentation

## 2010-11-05 DIAGNOSIS — M79609 Pain in unspecified limb: Secondary | ICD-10-CM

## 2010-11-05 DIAGNOSIS — E785 Hyperlipidemia, unspecified: Secondary | ICD-10-CM

## 2010-11-05 DIAGNOSIS — F528 Other sexual dysfunction not due to a substance or known physiological condition: Secondary | ICD-10-CM

## 2010-11-05 HISTORY — DX: Pain in unspecified limb: M79.609

## 2010-11-05 HISTORY — DX: Other specified soft tissue disorders: M79.89

## 2010-11-06 ENCOUNTER — Telehealth (INDEPENDENT_AMBULATORY_CARE_PROVIDER_SITE_OTHER): Payer: Self-pay | Admitting: *Deleted

## 2010-11-06 ENCOUNTER — Telehealth: Payer: Self-pay | Admitting: Internal Medicine

## 2010-11-13 NOTE — Progress Notes (Signed)
Summary: Call Report  Phone Note Other Incoming   Caller: [facility] Summary of Call: [facility] Triage Call Report Triage Record Num: [id number] Operator: April Finney Patient Name: Dillon [name] Call Date & Time: 11/05/2010 6:14:02PM Patient Phone: [phone] PCP: Oliver Barre Patient Gender: Male PCP Fax : [phone] Patient DOB: February 16, 1946 Practice Name: [facility] Reason for Call: Deboraha Sprang [facility] lab [phone] calling with results of Venous duplex. Left leg venous duplex negative for DVT but did find enlarged lymph node in left groin area. Leg is red and hot. Called Dr.Swords and information given. No new orders. Protocol(s) Used: Office Note Recommended Outcome per Protocol: Information Noted and Sent to Office Reason for Outcome: Caller information to office Care Advice:  ~ 02/ Initial call taken by: Margaret Pyle, CMA,  November 06, 2010 8:45 AM

## 2010-11-13 NOTE — Assessment & Plan Note (Signed)
Summary: left leg swollen   Vital Signs:  Patient profile:   65 year old male Height:      69 inches Weight:      192.25 pounds BMI:     28.49 O2 Sat:      95 % on Room air Temp:     99 degrees F oral Pulse rate:   74 / minute BP sitting:   142 / 84  (left arm) Cuff size:   regular  Vitals Entered By: Zella Ball Ewing CMA (AAMA) (November 05, 2010 4:04 PM)  O2 Flow:  Room air CC: Left leg swollen/RE   CC:  Left leg swollen/RE.  History of Present Illness: here with acute visit for 3 days acute onset LLE swelling and discomfort below the knee , not involving the knee, new for pt, no RLE swelling, no fever, redness but has mild diffuse tender as well, especialy to  an area to the mid lateral aspect;  Pt denies CP, worsening sob, doe, wheezing, orthopnea, pnd, worsening LE edema, palps, dizziness or syncope  Pt denies new neuro symptoms such as headache, facial or extremity weakness  Pt denies polydipsia, polyuria.  No prior hx of DVT or cellultiis.  No chills. No fever, wt loss, night sweats, loss of appetite or other constitutional symptoms Overall good compliance with meds, and good tolerability. ,  trying to follow low chol, DM diet, wt stable, little excercise however Has ongoing ED symptoms no change, needs cialis refill.     Problems Prior to Update: 1)  Swelling of Limb  (ICD-729.81) 2)  Leg Pain, Left  (ICD-729.5) 3)  Migraine, Common  (ICD-346.10) 4)  Preventive Health Care  (ICD-V70.0) 5)  Nocturia  (ICD-788.43) 6)  Hepatotoxicity, Drug-induced, Risk of  (ICD-V58.69) 7)  Thrombocytosis  (ICD-289.9) 8)  Erectile Dysfunction  (ICD-302.72) 9)  Health Screening  (ICD-V70.0) 10)  Special Screening Malig Neoplasms Other Sites  (ICD-V76.49) 11)  Fatigue  (ICD-780.79) 12)  Gerd  (ICD-530.81) 13)  Hyperlipidemia  (ICD-272.4)  Medications Prior to Update: 1)  Cialis 20 Mg Tabs (Tadalafil) .Marland Kitchen.. 1po Once Daily As Needed 2)  Adult Aspirin Ec Low Strength 81 Mg Tbec (Aspirin) .Marland Kitchen.. 1po  Once Daily 3)  Lovastatin 40 Mg Tabs (Lovastatin) .Marland Kitchen.. 1 By Mouth Once Daily 4)  Flomax 0.4 Mg Xr24h-Cap (Tamsulosin Hcl) .Marland Kitchen.. 1 By Mouth Once Daily (Generic)  Current Medications (verified): 1)  Cialis 20 Mg Tabs (Tadalafil) .Marland Kitchen.. 1po Once Daily As Needed 2)  Adult Aspirin Ec Low Strength 81 Mg Tbec (Aspirin) .Marland Kitchen.. 1po Once Daily 3)  Lovastatin 40 Mg Tabs (Lovastatin) .Marland Kitchen.. 1 By Mouth Once Daily 4)  Flomax 0.4 Mg Xr24h-Cap (Tamsulosin Hcl) .Marland Kitchen.. 1 By Mouth Once Daily (Generic) 5)  Doxycycline Hyclate 100 Mg Caps (Doxycycline Hyclate) .Marland Kitchen.. 1 By Mouth Two Times A Day  Allergies (verified): No Known Drug Allergies  Past History:  Past Medical History: Last updated: 11/15/2008 Hyperlipidemia E.D. 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   Head:  normocephalic and atraumatic.   Eyes:  vision grossly intact, pupils equal, and pupils round.   Ears:  R ear normal and L  ear normal.   Nose:  no external deformity and no nasal discharge.   Mouth:  no gingival abnormalities and pharynx pink and moist.   Neck:  supple and no masses.   Lungs:  normal respiratory effort and normal breath sounds.   Heart:  normal rate and regular rhythm.   Msk:  no joint tenderness and no joint swelling to the knees Extremities:  RLE no edema, no erythema ; LLE with swelling below knee involving calf, ankle and foot, with an area of ? worse erythema and tender to the left lateral mid leg aspect approx 1 cm area   Impression & Recommendations:  Problem # 1:  SWELLING OF LIMB (ICD-729.81) unclear etiology, cant r/o DVT  - for LE venous doppler now Orders: Radiology Referral (Radiology)  Problem # 2:  LEG PAIN, LEFT (ICD-729.5) with small area ? cellulitis left mid lateral leg - for doxycycline course Orders: Radiology Referral (Radiology)  Problem # 3:  ERECTILE DYSFUNCTION (ICD-302.72)  His updated medication list for this problem includes:    Cialis 20 Mg Tabs (Tadalafil) .Marland Kitchen... 1po once daily as needed stable overall by hx and exam, ok to continue meds/tx as is   Problem # 4:  HYPERLIPIDEMIA (ICD-272.4)  His updated medication list for this problem includes:    Lovastatin 40 Mg Tabs (Lovastatin) .Marland Kitchen... 1 by mouth once daily  Labs Reviewed: SGOT: 25 (11/29/2009)   SGPT: 34 (11/29/2009)   HDL:63.70 (11/29/2009), 57.20 (12/19/2008)  LDL:63 (11/29/2009), 108 (19/14/7829)  Chol:148 (11/29/2009), 189 (12/19/2008)  Trig:108.0 (11/29/2009), 119.0 (12/19/2008) stable overall by hx and exam, ok to continue meds/tx as is , but due for lab work next visit  Complete Medication List: 1)  Cialis 20 Mg Tabs (Tadalafil) .Marland Kitchen.. 1po once daily as needed 2)  Adult Aspirin Ec Low Strength 81 Mg Tbec (Aspirin) .Marland Kitchen.. 1po once daily 3)  Lovastatin 40 Mg Tabs (Lovastatin) .Marland Kitchen.. 1 by mouth once daily 4)  Flomax 0.4 Mg Xr24h-cap (Tamsulosin hcl) .Marland Kitchen.. 1 by mouth once daily (generic) 5)  Doxycycline Hyclate 100 Mg Caps (Doxycycline hyclate) .Marland Kitchen.. 1 by mouth two times a day  Patient Instructions: 1)  You will be contacted about the referral(s) to: left leg doppler ultrasound to make sure about blood clot in the leg 2)  Please take all new medications as prescribed  3)  Continue all previous medications as before this visit  4)  If there is a blood clot , you may need to go to the ER for treatment 5)  Please schedule a follow-up appointment in 1 month for CPX with labs Prescriptions: DOXYCYCLINE HYCLATE 100 MG CAPS (DOXYCYCLINE HYCLATE) 1 by mouth two times a day  #20 x 0   Entered and Authorized by:   Corwin Levins MD    Signed by:   Corwin Levins MD on 11/05/2010   Method used:   [facility]ron[facility] to        [facility] [address] [phone][facility] (retail)       [address]       [place], [place]  [id number]       Ph: [phone] or [phone]       Fax: [phone]   RxID:   [id number] LOVASTATIN 40 MG TABS (LOVASTATIN) 1 by mouth once daily  #90 x 3   Entered and Authorized by:   Corwin Levins MD   Signed by:   Corwin Levins MD on 11/05/2010  Method used:   [facility] to        [facility] [phone]* (retail)       [address]       [place], [place]  [address]       Ph: [phone] or [phone]       Fax: [phone]   RxID:   [id number] CIALIS 20 MG TABS (TADALAFIL) 1po once daily as needed  #5 x 11   Entered and Authorized by:   Corwin Levins MD   Signed by:   Corwin Levins MD on 11/05/2010   Method used:   [facility] to        [facility] [address] [phone]* (retail)       [address]       [place], [place]  [id number]       Ph: [phone] or [phone]       Fax: [phone]   RxID:   [id number]    Orders Added: 1)  Radiology Referral [Radiology] 2)  Est. Patient Level IV [26948]

## 2010-11-13 NOTE — Progress Notes (Signed)
----   Converted from flag ---- ---- 11/05/2010 9:04 PM, Corwin Levins MD wrote: Zella Ball to call pt - leg neg for DVT, so I called in antibx for prob leg infection;  also the cialis and statin also were called in  needs CPX with labs in 1 mo ------------------------------  Called pt informed of above information. Patient agreed to schedule CPX with labs and will call back to do so.

## 2010-11-16 ENCOUNTER — Other Ambulatory Visit: Payer: Self-pay | Admitting: Oncology

## 2010-11-16 ENCOUNTER — Encounter (HOSPITAL_BASED_OUTPATIENT_CLINIC_OR_DEPARTMENT_OTHER): Payer: Medicare Other | Admitting: Oncology

## 2010-11-16 ENCOUNTER — Encounter: Payer: Self-pay | Admitting: Internal Medicine

## 2010-11-16 DIAGNOSIS — D473 Essential (hemorrhagic) thrombocythemia: Secondary | ICD-10-CM

## 2010-11-16 DIAGNOSIS — Z7982 Long term (current) use of aspirin: Secondary | ICD-10-CM

## 2010-11-16 DIAGNOSIS — I749 Embolism and thrombosis of unspecified artery: Secondary | ICD-10-CM

## 2010-11-16 DIAGNOSIS — D72829 Elevated white blood cell count, unspecified: Secondary | ICD-10-CM

## 2010-11-16 LAB — COMPREHENSIVE METABOLIC PANEL
ALT: 15 U/L (ref 0–53)
CO2: 25 mEq/L (ref 19–32)
Calcium: 10.2 mg/dL (ref 8.4–10.5)
Chloride: 103 mEq/L (ref 96–112)
Glucose, Bld: 103 mg/dL — ABNORMAL HIGH (ref 70–99)
Sodium: 139 mEq/L (ref 135–145)
Total Protein: 7.5 g/dL (ref 6.0–8.3)

## 2010-11-16 LAB — CBC WITH DIFFERENTIAL/PLATELET
Basophils Absolute: 0 10*3/uL (ref 0.0–0.1)
Eosinophils Absolute: 0.4 10*3/uL (ref 0.0–0.5)
HGB: 16.6 g/dL (ref 13.0–17.1)
LYMPH%: 23.2 % (ref 14.0–49.0)
MCV: 83 fL (ref 79.3–98.0)
MONO#: 0.6 10*3/uL (ref 0.1–0.9)
NEUT#: 8.1 10*3/uL — ABNORMAL HIGH (ref 1.5–6.5)
Platelets: 723 10*3/uL — ABNORMAL HIGH (ref 140–400)
RBC: 6.17 10*6/uL — ABNORMAL HIGH (ref 4.20–5.82)
WBC: 11.8 10*3/uL — ABNORMAL HIGH (ref 4.0–10.3)

## 2010-11-16 LAB — TECHNOLOGIST REVIEW

## 2010-12-04 NOTE — Letter (Signed)
Summary: [facility]  [facility]   Imported By: Sherian Rein 11/29/2010 11:51:17  _____________________________________________________________________  External Attachment:    Type:   Image     Comment:   External Document

## 2010-12-09 LAB — DIFFERENTIAL
Basophils Absolute: 0.2 10*3/uL — ABNORMAL HIGH (ref 0.0–0.1)
Lymphocytes Relative: 21 % (ref 12–46)
Lymphs Abs: 2.2 10*3/uL (ref 0.7–4.0)
Neutrophils Relative %: 68 % (ref 43–77)

## 2010-12-09 LAB — CHROMOSOME ANALYSIS, BONE MARROW

## 2010-12-09 LAB — CBC
Platelets: 776 10*3/uL — ABNORMAL HIGH (ref 150–400)
RDW: 15.6 % — ABNORMAL HIGH (ref 11.5–15.5)
WBC: 10.1 10*3/uL (ref 4.0–10.5)

## 2010-12-09 LAB — BONE MARROW EXAM

## 2011-02-01 NOTE — Op Note (Signed)
NAME:  Dillon [name], Dillon [name]                      ACCOUNT NO.:  [id number]   MEDICAL RECORD NO.:  [id number]                   PATIENT TYPE:  AMB   LOCATION:  DAY                                  FACILITY:  [facility]   PHYSICIAN:  Ollen Gross. Vernell Morgans, M.D.              DATE OF BIRTH:  [birth year]   DATE OF PROCEDURE:  06/30/2003  DATE OF DISCHARGE:                                 OPERATIVE REPORT   PREOPERATIVE DIAGNOSIS:  Umbilical hernia.   POSTOPERATIVE DIAGNOSIS:  Umbilical hernia.   PROCEDURE:  Umbilical hernia repair with mesh.   SURGEON:  Ollen Gross. Carolynne Edouard, M.D.   ANESTHESIA:  General endotracheal.   DESCRIPTION OF PROCEDURE:  After informed consent was obtained, the patient  was brought to the operating room and placed in a supine position on the  operating room table.  After adequate induction of general anesthesia, the  patient's abdomen was prepped with Betadine and draped in the usual sterile  manner.  The area just superior to the umbilicus was infiltrated with 0.25%  Marcaine with epinephrine.  A short vertically-oriented incision was made  just at the superior edge of the umbilicus and extending superiorly.  This  incision was carried down through the skin and subcutaneous tissue sharply  with the electrocautery.  The umbilical defect was located.  There was some  preperitoneal fat within the defect.  This was easily mobilized back beneath  the fascia.  The rest of the anterior abdominal wall was palpated with a  finger, and there was no other obvious hernia superior to this, although  there was a small swollen area in that same spot.  In dissecting the  subcutaneous tissue, there was what appeared to be some preperitoneal fat  and just superior to the umbilicus but no hernia defect could be identified.  The fascial incision at the umbilical defect was then closed with  interrupted 0 Prolene stitches.  The subcutaneous tissue was undermined  somewhat just anterior to the  abdominal wall fascia.  A 3 x 6 piece of mesh  was then cut to fit the defect.  The mesh was anchored to the repair of the  fascia using the tails of the 0 Prolene stitches, and then the mesh itself  was anchored in four quadrants to the fascia of the anterior abdominal wall  using interrupted 0 Prolene stitches.  The wound was then irrigated with  copious amounts of saline.  The subcutaneous tissue was then closed with  interrupted 3-0 Vicryl stitches, and the skin was closed with interrupted 4-  0 Monocryl subcuticular stitches.  Benzoin and Steri-Strips were applied.  The umbilicus was then packed with sterile cotton ball, and gauze dressings  and an abdominal binder were applied.  The patient tolerated the procedure  well.  At the end of the case, all needle, sponge, and instrument counts  were correct.  The patient was then awakened and taken  to the recovery room  in stable condition.                                               Ollen Gross. Vernell Morgans, M.D.    PST/MEDQ  D:  06/30/2003  T:  06/30/2003  Job:  [id number]

## 2011-02-04 ENCOUNTER — Encounter: Payer: PRIVATE HEALTH INSURANCE | Admitting: Internal Medicine

## 2011-03-19 ENCOUNTER — Encounter (HOSPITAL_BASED_OUTPATIENT_CLINIC_OR_DEPARTMENT_OTHER): Payer: Medicare Other | Admitting: Oncology

## 2011-03-19 ENCOUNTER — Other Ambulatory Visit: Payer: Self-pay | Admitting: Oncology

## 2011-03-19 DIAGNOSIS — I749 Embolism and thrombosis of unspecified artery: Secondary | ICD-10-CM

## 2011-03-19 DIAGNOSIS — D473 Essential (hemorrhagic) thrombocythemia: Secondary | ICD-10-CM

## 2011-03-19 DIAGNOSIS — Z7982 Long term (current) use of aspirin: Secondary | ICD-10-CM

## 2011-03-19 DIAGNOSIS — D72829 Elevated white blood cell count, unspecified: Secondary | ICD-10-CM

## 2011-03-19 DIAGNOSIS — D47Z9 Other specified neoplasms of uncertain behavior of lymphoid, hematopoietic and related tissue: Secondary | ICD-10-CM

## 2011-03-19 LAB — CBC WITH DIFFERENTIAL/PLATELET
Basophils Absolute: 0.1 10*3/uL (ref 0.0–0.1)
EOS%: 4.3 % (ref 0.0–7.0)
Eosinophils Absolute: 0.4 10*3/uL (ref 0.0–0.5)
HGB: 16.3 g/dL (ref 13.0–17.1)
MCH: 27.7 pg (ref 27.2–33.4)
MCV: 86 fL (ref 79.3–98.0)
MONO%: 5.5 % (ref 0.0–14.0)
NEUT#: 7.3 10*3/uL — ABNORMAL HIGH (ref 1.5–6.5)
RBC: 5.89 10*6/uL — ABNORMAL HIGH (ref 4.20–5.82)
RDW: 16.7 % — ABNORMAL HIGH (ref 11.0–14.6)
lymph#: 1.8 10*3/uL (ref 0.9–3.3)

## 2011-03-19 LAB — COMPREHENSIVE METABOLIC PANEL
ALT: 18 U/L (ref 0–53)
AST: 18 U/L (ref 0–37)
Albumin: 4.2 g/dL (ref 3.5–5.2)
Alkaline Phosphatase: 55 U/L (ref 39–117)
BUN: 10 mg/dL (ref 6–23)
Calcium: 9.4 mg/dL (ref 8.4–10.5)
Chloride: 105 mEq/L (ref 96–112)
Potassium: 4.1 mEq/L (ref 3.5–5.3)
Sodium: 139 mEq/L (ref 135–145)
Total Protein: 6.4 g/dL (ref 6.0–8.3)

## 2011-04-17 ENCOUNTER — Encounter: Payer: Self-pay | Admitting: Internal Medicine

## 2011-04-17 DIAGNOSIS — Z0000 Encounter for general adult medical examination without abnormal findings: Secondary | ICD-10-CM

## 2011-04-17 DIAGNOSIS — Z0001 Encounter for general adult medical examination with abnormal findings: Secondary | ICD-10-CM | POA: Insufficient documentation

## 2011-04-18 ENCOUNTER — Ambulatory Visit (INDEPENDENT_AMBULATORY_CARE_PROVIDER_SITE_OTHER): Payer: Medicare Other | Admitting: Internal Medicine

## 2011-04-18 ENCOUNTER — Other Ambulatory Visit (INDEPENDENT_AMBULATORY_CARE_PROVIDER_SITE_OTHER): Payer: Medicare Other

## 2011-04-18 ENCOUNTER — Encounter: Payer: Self-pay | Admitting: Internal Medicine

## 2011-04-18 VITALS — BP 140/90 | HR 66 | Temp 98.0°F | Ht 69.0 in | Wt 181.6 lb

## 2011-04-18 DIAGNOSIS — N320 Bladder-neck obstruction: Secondary | ICD-10-CM

## 2011-04-18 DIAGNOSIS — E785 Hyperlipidemia, unspecified: Secondary | ICD-10-CM

## 2011-04-18 DIAGNOSIS — R7302 Impaired glucose tolerance (oral): Secondary | ICD-10-CM

## 2011-04-18 DIAGNOSIS — R5383 Other fatigue: Secondary | ICD-10-CM

## 2011-04-18 DIAGNOSIS — L989 Disorder of the skin and subcutaneous tissue, unspecified: Secondary | ICD-10-CM

## 2011-04-18 DIAGNOSIS — D759 Disease of blood and blood-forming organs, unspecified: Secondary | ICD-10-CM

## 2011-04-18 DIAGNOSIS — R5381 Other malaise: Secondary | ICD-10-CM

## 2011-04-18 DIAGNOSIS — F528 Other sexual dysfunction not due to a substance or known physiological condition: Secondary | ICD-10-CM

## 2011-04-18 DIAGNOSIS — R7309 Other abnormal glucose: Secondary | ICD-10-CM

## 2011-04-18 HISTORY — DX: Impaired glucose tolerance (oral): R73.02

## 2011-04-18 HISTORY — DX: Bladder-neck obstruction: N32.0

## 2011-04-18 LAB — LIPID PANEL
Cholesterol: 196 mg/dL (ref 0–200)
HDL: 73.6 mg/dL (ref >39.00)
Triglycerides: 75 mg/dL (ref 0.0–149.0)
VLDL: 15 mg/dL (ref 0.0–40.0)

## 2011-04-18 LAB — URINALYSIS, ROUTINE W REFLEX MICROSCOPIC
Bilirubin Urine: NEGATIVE
Ketones, ur: NEGATIVE
Nitrite: NEGATIVE
Total Protein, Urine: NEGATIVE
Urine Glucose: NEGATIVE
pH: 6 (ref 5.0–8.0)

## 2011-04-18 LAB — HEMOGLOBIN A1C: Hgb A1c MFr Bld: 5.6 % (ref 4.6–6.5)

## 2011-04-18 MED ORDER — LOVASTATIN 40 MG PO TABS: 40.0000 mg | ORAL_TABLET | Freq: Every day | ORAL | Status: DC

## 2011-04-18 MED ORDER — VARDENAFIL HCL 20 MG PO TABS: 20.0000 mg | ORAL_TABLET | ORAL | Status: DC | PRN

## 2011-04-18 NOTE — Assessment & Plan Note (Signed)
Subq mass mid left medial thigh, c/w prob benign lesion,  to f/u any worsening symptoms or concerns

## 2011-04-18 NOTE — Patient Instructions (Signed)
Take all new medications as prescribed - the levitra (less expensive at [facility] and [facility]) Continue all other medications as before Please go to LAB in the Basement for the blood and/or urine tests to be done today Please call the phone number [phone] (the PhoneTree System) for results of testing in 2-3 days;  When calling, simply dial the number, and when prompted enter the MRN number above (the Medical Record Number) and the # key, then the message should start.

## 2011-04-18 NOTE — Assessment & Plan Note (Signed)
Asympt, mild at best with recent elev BS 115 - to check a1c

## 2011-04-18 NOTE — Assessment & Plan Note (Signed)
Etiology unclear, Exam otherwise benign, to check labs as documented, follow with expectant management  Lab Results  Component Value Date   WBC 10.7* 11/29/2009   HGB 16.3 03/19/2011   HCT 50.6* 03/19/2011   PLT 679* 03/19/2011   CHOL 148 11/29/2009   TRIG 108.0 11/29/2009   HDL 63.70 11/29/2009   LDLDIRECT 134.0 03/04/2007   ALT 18 03/19/2011   ALT 18 03/19/2011   AST 18 03/19/2011   AST 18 03/19/2011   NA 139 03/19/2011   NA 139 03/19/2011   K 4.1 03/19/2011   K 4.1 03/19/2011   CL 105 03/19/2011   CL 105 03/19/2011   CREATININE 1.00 03/19/2011   CREATININE 1.00 03/19/2011   BUN 10 03/19/2011   BUN 10 03/19/2011   CO2 22 03/19/2011   CO2 22 03/19/2011   TSH 1.95 11/29/2009   PSA 1.82 11/29/2009   Had recent cbc, cmet d/w pt

## 2011-04-18 NOTE — Assessment & Plan Note (Signed)
Mild, for levitra due to cost concerns,  to f/u any worsening symptoms or concerns

## 2011-04-18 NOTE — Assessment & Plan Note (Signed)
stable overall by hx and exam, most recent data reviewed with pt, and pt to continue medical treatment as before  Lab Results  Component Value Date   LDLCALC 63 11/29/2009   To check lipids, follow diet

## 2011-04-18 NOTE — Assessment & Plan Note (Signed)
Recent plt count mild improved, follows with oncology, to cont to follow

## 2011-04-28 ENCOUNTER — Encounter: Payer: Self-pay | Admitting: Internal Medicine

## 2011-04-28 NOTE — Assessment & Plan Note (Signed)
Also for pSA today

## 2011-04-28 NOTE — Progress Notes (Signed)
  Subjective:    Patient ID: Dillon [name], male    DOB: 1946/08/03, 65 y.o.   MRN: [id number]  HPI  Here for f/u;  Overall doing ok;  Pt denies CP, worsening SOB, DOE, wheezing, orthopnea, PND, worsening LE edema, palpitations, dizziness or syncope.  Pt denies neurological change such as new Headache, facial or extremity weakness.  Pt denies polydipsia, polyuria, or low sugar symptoms. Pt states overall good compliance with treatment and medications, good tolerability, and trying to follow lower cholesterol diet.  Pt denies worsening depressive symptoms, suicidal ideation or panic. No fever, wt loss, night sweats, loss of appetite, or other constitutional symptoms.  Pt states good ability with ADL's, low fall risk, home safety reviewed and adequate, no significant changes in hearing or vision, and occasionally active with exercise. Does have sense of ongoing fatigue, but denies signficant hypersomnolence.  Also with subq lesion to left mid thigh , no change for > 6 mo Past Medical History  Diagnosis Date  . ERECTILE DYSFUNCTION 11/15/2008  . FATIGUE 11/15/2008  . GERD 11/15/2008  . HYPERLIPIDEMIA 11/15/2008  . LEG PAIN, LEFT 11/05/2010  . MIGRAINE, COMMON 08/06/2010  . Nocturia 12/19/2008  . Swelling of limb 11/05/2010  . THROMBOCYTOSIS 12/19/2008  . Impaired glucose tolerance 04/18/2011  . Bladder neck obstruction 04/18/2011   Past Surgical History  Procedure Date  . Appendectomy   . Tonsillectomy   . S/p esophageal dilation     reports that he has quit smoking. He does not have any smokeless tobacco history on file. He reports that he drinks alcohol. He reports that he does not use illicit drugs. family history includes Cancer in his father and Hypertension in his brother, mother, and sister. No Known Allergies Current Outpatient Prescriptions on File Prior to Visit  Medication Sig Dispense Refill  . aspirin 81 MG tablet Take 81 mg by mouth daily.         Review of Systems Review of Systems    Constitutional: Negative for diaphoresis and unexpected weight change.  HENT: Negative for drooling and tinnitus.   Eyes: Negative for photophobia and visual disturbance.  Respiratory: Negative for choking and stridor.   Gastrointestinal: Negative for vomiting and blood in stool.  Genitourinary: Negative for hematuria and decreased urine volume.  Musculoskeletal: Negative for gait problem.  Skin: Negative for color change and wound.  Neurological: Negative for tremors and numbness.  Psychiatric/Behavioral: Negative for decreased concentration. The patient is not hyperactive.       Objective:   Physical Exam BP 140/90  Pulse 66  Temp(Src) 98 F (36.7 C) (Oral)  Ht 5\' 9"  (1.753 m)  Wt 181 lb 9 oz (82.356 kg)  BMI 26.81 kg/m2  SpO2 96% Physical Exam  VS noted Constitutional: Pt appears well-developed and well-nourished.  HENT: Head: Normocephalic.  Right Ear: External ear normal.  Left Ear: External ear normal.  Eyes: Conjunctivae and EOM are normal. Pupils are equal, round, and reactive to light.  Neck: Normal range of motion. Neck supple.  Cardiovascular: Normal rate and regular rhythm.   Pulmonary/Chest: Effort normal and breath sounds normal.  Abd:  Soft, NT, non-distended, + BS Neurological: Pt is alert. No cranial nerve deficit.  Skin: Skin is warm. No erythema. mid left thigh with < 1 cm subq nodule, nontender, regular, bening appearing Psychiatric: Pt behavior is normal. Thought content normal.        Assessment & Plan:

## 2011-05-27 ENCOUNTER — Encounter: Payer: Self-pay | Admitting: Internal Medicine

## 2011-05-27 ENCOUNTER — Ambulatory Visit (INDEPENDENT_AMBULATORY_CARE_PROVIDER_SITE_OTHER): Payer: Medicare Other | Admitting: Internal Medicine

## 2011-05-27 ENCOUNTER — Ambulatory Visit (INDEPENDENT_AMBULATORY_CARE_PROVIDER_SITE_OTHER)
Admission: RE | Admit: 2011-05-27 | Discharge: 2011-05-27 | Disposition: A | Discharge status: Home / Self Care | Payer: Medicare Other | Source: Ambulatory Visit | Attending: Internal Medicine | Admitting: Internal Medicine

## 2011-05-27 VITALS — BP 142/88 | HR 83 | Temp 98.1°F | Ht 69.0 in | Wt 177.2 lb

## 2011-05-27 DIAGNOSIS — R7302 Impaired glucose tolerance (oral): Secondary | ICD-10-CM

## 2011-05-27 DIAGNOSIS — M25569 Pain in unspecified knee: Secondary | ICD-10-CM

## 2011-05-27 DIAGNOSIS — M25562 Pain in left knee: Secondary | ICD-10-CM

## 2011-05-27 DIAGNOSIS — R7309 Other abnormal glucose: Secondary | ICD-10-CM

## 2011-05-27 DIAGNOSIS — E785 Hyperlipidemia, unspecified: Secondary | ICD-10-CM

## 2011-05-27 IMAGING — CR DG KNEE COMPLETE 4+V*L*
4 series · 4 of 4 positions shown · non-contrast
Comparison: None.

CLINICAL DATA: Left knee swelling.  No injury.

LEFT KNEE - COMPLETE 4+ VIEW

[view not recorded (1 of 4)]
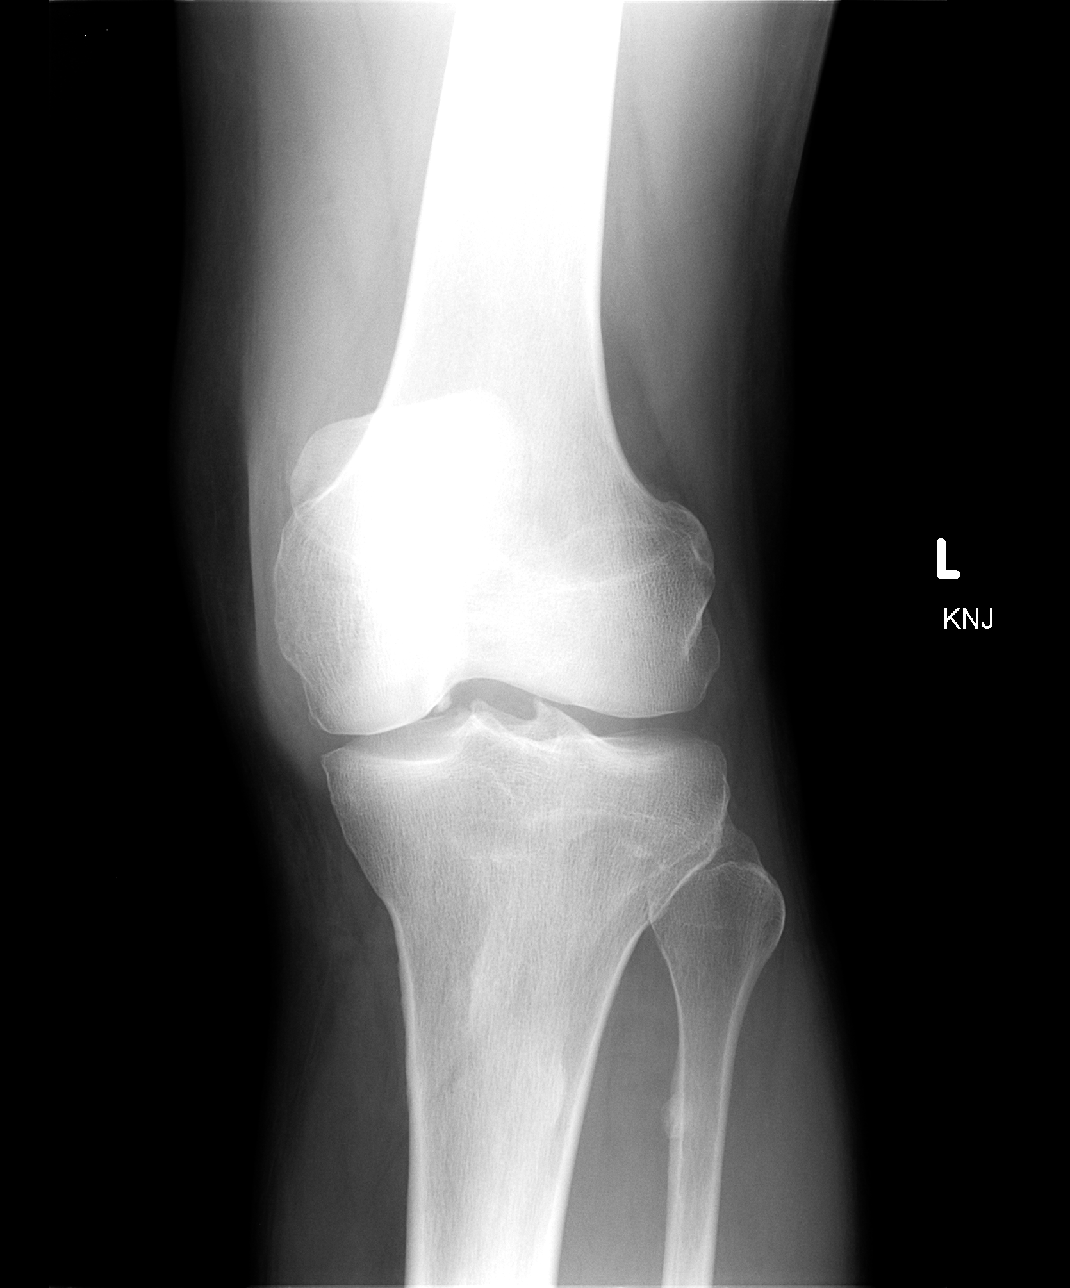

[view not recorded (2 of 4)]
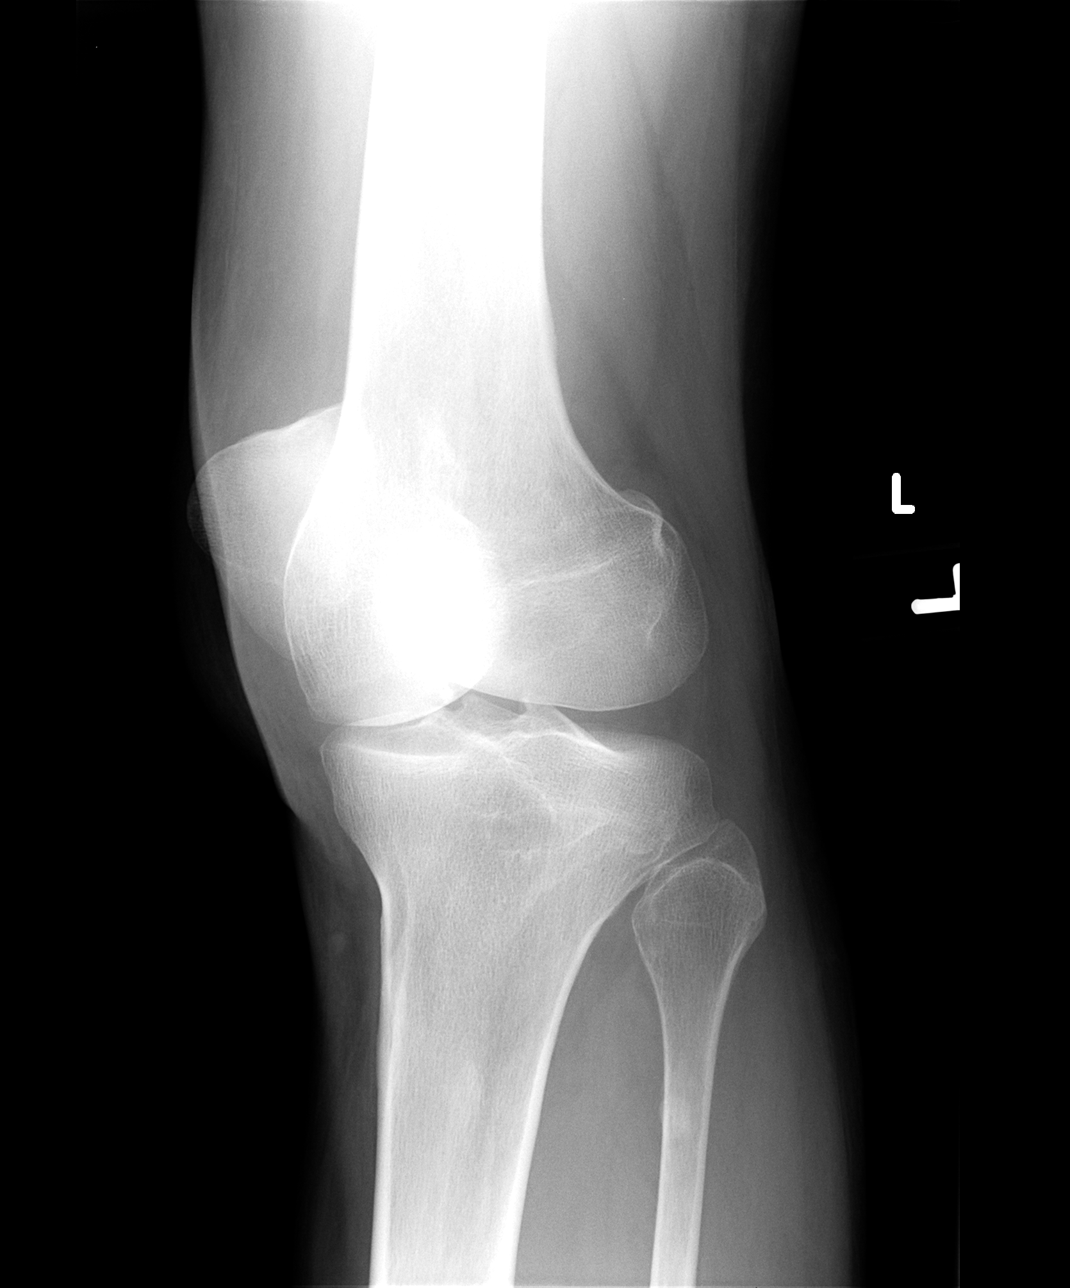

[view not recorded (3 of 4)]
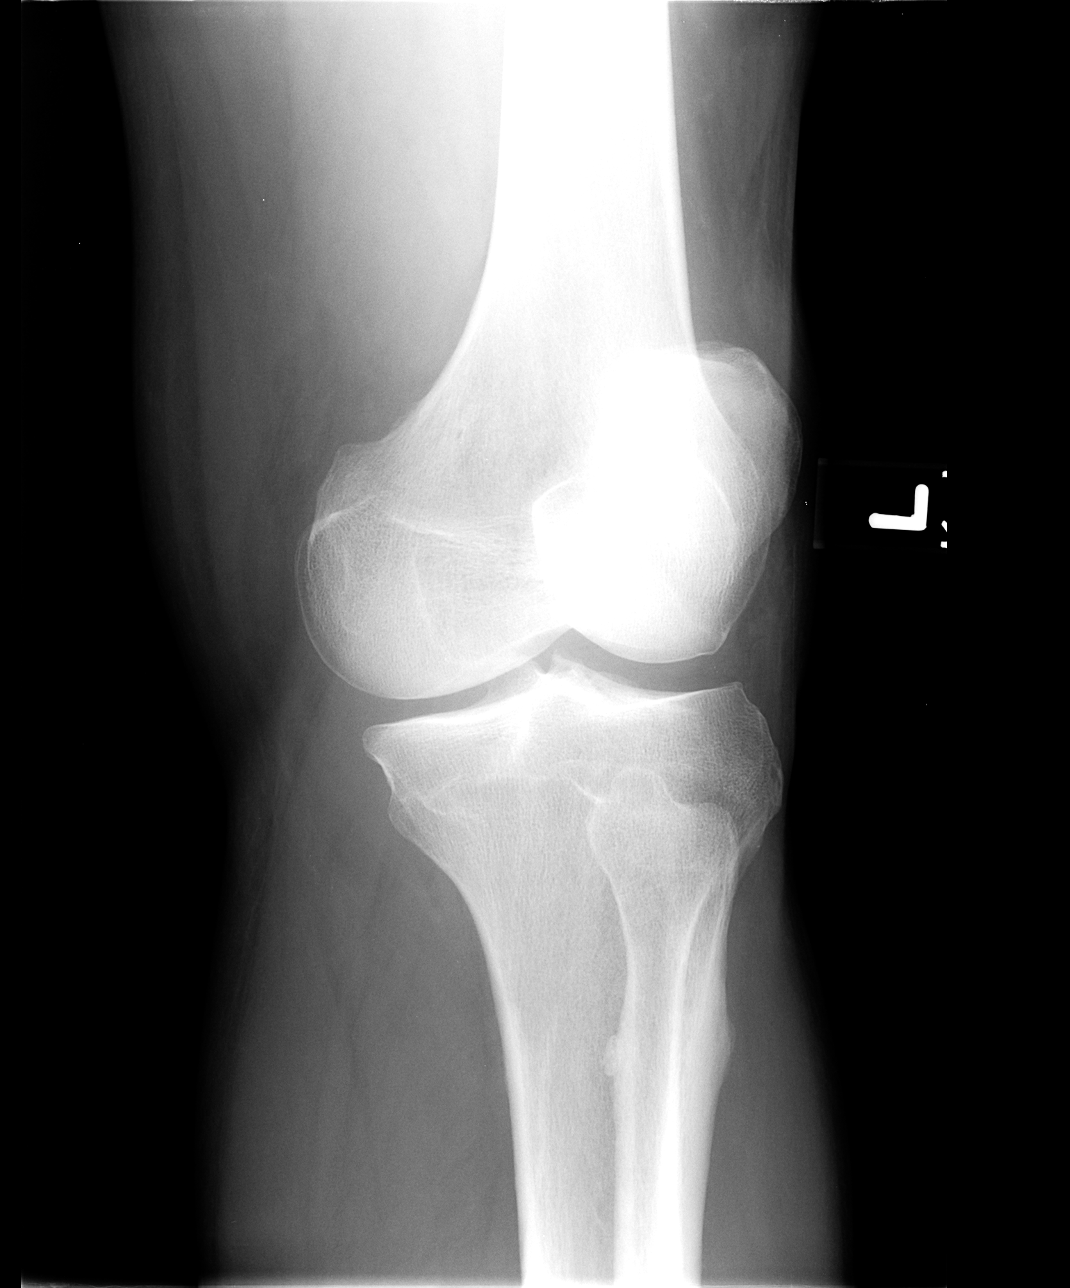

[view not recorded (4 of 4)]
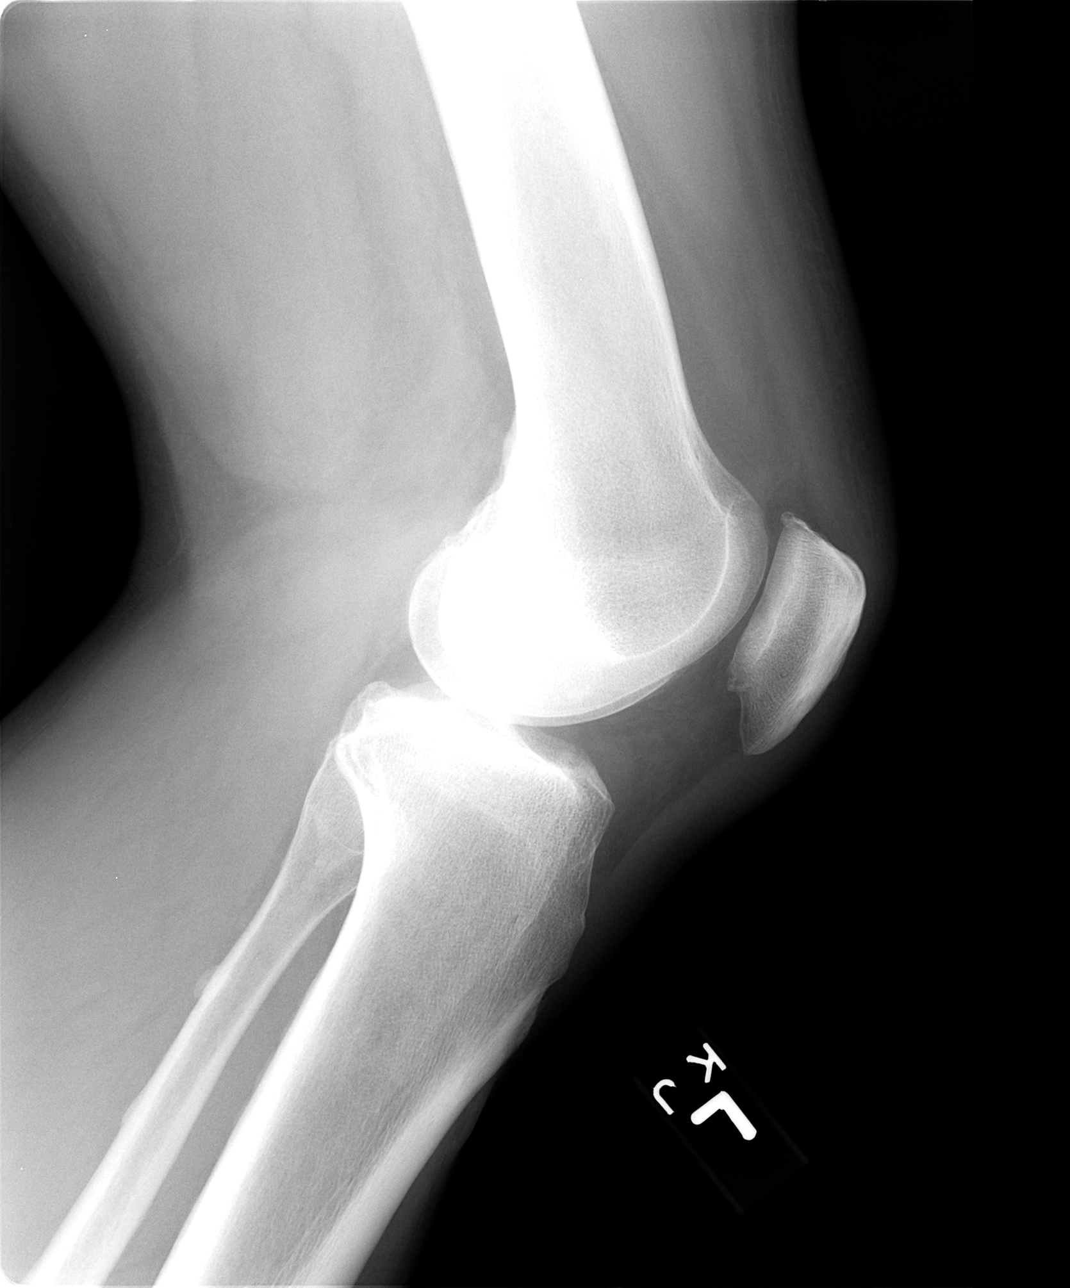

[4 of 4 positions shown; findings below may reference images not displayed]

FINDINGS: Very mild degenerative changes medial tibiofemoral joint
space and patellofemoral joint space.

Bony overgrowth proximal fibula.
IMPRESSION: Very mild degenerative changes medial tibiofemoral joint space and
patellofemoral joint space.

Bony overgrowth proximal fibula.

## 2011-05-27 MED ORDER — PREDNISONE 10 MG PO TABS: ORAL_TABLET | ORAL | Status: DC

## 2011-05-27 MED ORDER — TRAMADOL-ACETAMINOPHEN 37.5-325 MG PO TABS: 1.0000 | ORAL_TABLET | Freq: Four times a day (QID) | ORAL | Status: AC | PRN

## 2011-05-27 NOTE — Patient Instructions (Addendum)
You appear to have bursitis of the knee (although we can't be sure you don't have knee strain or arthritis flare of the knee) Take all new medications as prescribed - the pain medication, and prednisone (anti-inflammatory) You are given the work note today for 3 days off work Continue all other medications as before Please go to XRAY in the Basement for the x-ray test Please call the phone number [phone] (the PhoneTree System) for results of testing in 2-3 days;  When calling, simply dial the number, and when prompted enter the MRN number above (the Medical Record Number) and the # key, then the message should start.

## 2011-05-28 ENCOUNTER — Encounter: Payer: Self-pay | Admitting: Internal Medicine

## 2011-05-28 NOTE — Assessment & Plan Note (Signed)
Hx and exam most c/w anserine bursitis, vs medial collat ligamentous strain, doubt intra-articular pathology such as signfint DJD flare, ACL dz or meniscal tear thoug cant completely be ruled out;  tx with predpack for home, f/u worsening s/s, consider ortho eval

## 2011-05-28 NOTE — Assessment & Plan Note (Signed)
stable overall by hx and exam, most recent data reviewed with pt, and pt to continue medical treatment as before  Lab Results  Component Value Date   HGBA1C 5.6 04/18/2011

## 2011-05-28 NOTE — Progress Notes (Signed)
  Subjective:    Patient ID: Dillon [name], male    DOB: 1946-08-26, 65 y.o.   MRN: [id number]  HPI  Here to c/o 3-4 days marked pain/swelling to medial left knee without overt trauma, though may have twisted the knee somewhat prior to the onset of the pain.   Pt denies fever, wt loss, night sweats, loss of appetite, or other constitutional symptoms  No hx of gout, bursitis.  Pt denies chest pain, increased sob or doe, wheezing, orthopnea, PND, increased LE swelling, palpitations, dizziness or syncope.  Pt denies new neurological symptoms such as new headache, or facial or extremity weakness or numbness   Pt denies polydipsia, polyuria  Pt states overall good compliance with meds, trying to follow lower cholesterol diet, wt overall stable but little exercise however.    Past Medical History  Diagnosis Date  . ERECTILE DYSFUNCTION 11/15/2008  . FATIGUE 11/15/2008  . GERD 11/15/2008  . HYPERLIPIDEMIA 11/15/2008  . LEG PAIN, LEFT 11/05/2010  . MIGRAINE, COMMON 08/06/2010  . Nocturia 12/19/2008  . Swelling of limb 11/05/2010  . THROMBOCYTOSIS 12/19/2008  . Impaired glucose tolerance 04/18/2011  . Bladder neck obstruction 04/18/2011   Past Surgical History  Procedure Date  . Appendectomy   . Tonsillectomy   . S/p esophageal dilation     reports that he has quit smoking. He does not have any smokeless tobacco history on file. He reports that he drinks alcohol. He reports that he does not use illicit drugs. family history includes Cancer in his father and Hypertension in his brother, mother, and sister. No Known Allergies Current Outpatient Prescriptions on File Prior to Visit  Medication Sig Dispense Refill  . aspirin 81 MG tablet Take 81 mg by mouth daily.        Marland Kitchen lovastatin (MEVACOR) 40 MG tablet Take 1 tablet (40 mg total) by mouth daily.  90 tablet  3  . vardenafil (LEVITRA) 20 MG tablet Take 1 tablet (20 mg total) by mouth as needed for erectile dysfunction.  10 tablet  5   Review of  Systems Review of Systems  Constitutional: Negative for diaphoresis and unexpected weight change.  HENT: Negative for drooling and tinnitus.   Eyes: Negative for photophobia and visual disturbance.  Respiratory: Negative for choking and stridor.   Gastrointestinal: Negative for vomiting and blood in stool.  Genitourinary: Negative for hematuria and decreased urine volume.         Objective:   Physical Exam BP 142/88  Pulse 83  Temp(Src) 98.1 F (36.7 C) (Oral)  Ht 5\' 9"  (1.753 m)  Wt 177 lb 4 oz (80.4 kg)  BMI 26.18 kg/m2  SpO2 97% Physical Exam  VS noted Constitutional: Pt appears well-developed and well-nourished.  HENT: Head: Normocephalic.  Right Ear: External ear normal.  Left Ear: External ear normal.  Eyes: Conjunctivae and EOM are normal. Pupils are equal, round, and reactive to light.  Neck: Normal range of motion. Neck supple.  Cardiovascular: Normal rate and regular rhythm.   Pulmonary/Chest: Effort normal and breath sounds normal.  Neurological: Pt is alert. No cranial nerve deficit.  Skin: Skin is warm. No erythema.  Left knee with FROM, but 2-3+ 4-5 cm but relatively localized tender, swelling to medial left knee, no knee effusion and o/w nontender      Assessment & Plan:

## 2011-05-28 NOTE — Assessment & Plan Note (Signed)
stable overall by hx and exam, most recent data reviewed with pt, and pt to continue medical treatment as before  Lab Results  Component Value Date   LDLCALC 107* 04/18/2011

## 2011-06-19 ENCOUNTER — Other Ambulatory Visit: Payer: Self-pay | Admitting: Medical

## 2011-06-19 ENCOUNTER — Encounter (HOSPITAL_BASED_OUTPATIENT_CLINIC_OR_DEPARTMENT_OTHER): Payer: Medicare Other | Admitting: Oncology

## 2011-06-19 DIAGNOSIS — I749 Embolism and thrombosis of unspecified artery: Secondary | ICD-10-CM

## 2011-06-19 DIAGNOSIS — Z7982 Long term (current) use of aspirin: Secondary | ICD-10-CM

## 2011-06-19 DIAGNOSIS — D473 Essential (hemorrhagic) thrombocythemia: Secondary | ICD-10-CM

## 2011-06-19 LAB — CBC WITH DIFFERENTIAL/PLATELET
Basophils Absolute: 0.1 10*3/uL (ref 0.0–0.1)
Eosinophils Absolute: 0.4 10*3/uL (ref 0.0–0.5)
HCT: 53 % — ABNORMAL HIGH (ref 38.4–49.9)
LYMPH%: 22.2 % (ref 14.0–49.0)
MCV: 82.8 fL (ref 79.3–98.0)
MONO#: 0.7 10*3/uL (ref 0.1–0.9)
MONO%: 6.7 % (ref 0.0–14.0)
NEUT#: 6.5 10*3/uL (ref 1.5–6.5)
NEUT%: 66.4 % (ref 39.0–75.0)
Platelets: 623 10*3/uL — ABNORMAL HIGH (ref 140–400)
RBC: 6.4 10*6/uL — ABNORMAL HIGH (ref 4.20–5.82)
nRBC: 0 % (ref 0–0)

## 2011-06-19 LAB — COMPREHENSIVE METABOLIC PANEL
BUN: 16 mg/dL (ref 6–23)
CO2: 22 mEq/L (ref 19–32)
Calcium: 9.5 mg/dL (ref 8.4–10.5)
Chloride: 105 mEq/L (ref 96–112)
Creatinine, Ser: 1.08 mg/dL (ref 0.50–1.35)
Glucose, Bld: 86 mg/dL (ref 70–99)
Total Bilirubin: 1 mg/dL (ref 0.3–1.2)

## 2011-07-11 ENCOUNTER — Ambulatory Visit (INDEPENDENT_AMBULATORY_CARE_PROVIDER_SITE_OTHER): Payer: Medicare Other | Admitting: Internal Medicine

## 2011-07-11 ENCOUNTER — Encounter: Payer: Self-pay | Admitting: Internal Medicine

## 2011-07-11 VITALS — BP 150/80 | HR 90 | Temp 98.3°F | Ht 69.0 in | Wt 175.5 lb

## 2011-07-11 DIAGNOSIS — E785 Hyperlipidemia, unspecified: Secondary | ICD-10-CM

## 2011-07-11 DIAGNOSIS — R7302 Impaired glucose tolerance (oral): Secondary | ICD-10-CM

## 2011-07-11 DIAGNOSIS — M79603 Pain in arm, unspecified: Secondary | ICD-10-CM

## 2011-07-11 DIAGNOSIS — M79609 Pain in unspecified limb: Secondary | ICD-10-CM

## 2011-07-11 DIAGNOSIS — R7309 Other abnormal glucose: Secondary | ICD-10-CM

## 2011-07-11 DIAGNOSIS — R030 Elevated blood-pressure reading, without diagnosis of hypertension: Secondary | ICD-10-CM | POA: Insufficient documentation

## 2011-07-11 MED ORDER — DOXYCYCLINE HYCLATE 100 MG PO TABS: 100.0000 mg | ORAL_TABLET | Freq: Two times a day (BID) | ORAL | Status: AC

## 2011-07-11 NOTE — Assessment & Plan Note (Signed)
Likely situational, ok to follow, cont same tx, for lower salt diet

## 2011-07-11 NOTE — Assessment & Plan Note (Signed)
Rather severe subq hemorrhagic complication of an influenza injection directly to the left deltoid it seems, with mod large tender swollen area just prox to the tricep/post deltoid, and large echymotic area to medial upper arm, elbow and anterior arm, also with tender/erythema/swelling to "leading edge" area of the distal arm near the wrist - cant r/o cellultiis as well, doubt abscess, declines cbc, but will tx with doxy course, ok to cont asa for now

## 2011-07-11 NOTE — Assessment & Plan Note (Signed)
stable overall by hx and exam, most recent data reviewed with pt, and pt to continue medical treatment as before  Lab Results  Component Value Date   LDLCALC 107* 04/18/2011    

## 2011-07-11 NOTE — Assessment & Plan Note (Signed)
stable overall by hx and exam, most recent data reviewed with pt, and pt to continue medical treatment as before  Lab Results  Component Value Date   HGBA1C 5.6 04/18/2011    

## 2011-07-11 NOTE — Progress Notes (Signed)
Subjective:    Patient ID: Dillon [name], male    DOB: 1945/09/20, 65 y.o.   MRN: [id number]  HPIHere after receiving a flu shot at the local [facility] on oct 17 to the mid left deltoid area, with unfortunately subsequent tender swelling area to the post deltoid area about the tricep insertion site, with then gradual bruising type skin color changes seemed to track down the post arm, then medial/anterior arm as well;  Today with onset warmth/tender/sweling to the "leading edge" mid left arm noted today, without fever, chills.  Pt denies chest pain, increased sob or doe, wheezing, orthopnea, PND, increased LE swelling, palpitations, dizziness or syncope.  Pt denies new neurological symptoms such as new headache, or facial or extremity weakness or numbness   Pt denies polydipsia, polyuria. Takes Asa 81 mg daily throughout.  No other bruising or bleeding.   Pt denies fever, wt loss, night sweats, loss of appetite, or other constitutional symptoms.  Overall good compliance with treatment, and good medicine tolerability.  Marland Kitchentrying to follow lower cholesterol diet, wt overall stable.   Past Medical History  Diagnosis Date  . ERECTILE DYSFUNCTION 11/15/2008  . FATIGUE 11/15/2008  . GERD 11/15/2008  . HYPERLIPIDEMIA 11/15/2008  . LEG PAIN, LEFT 11/05/2010  . MIGRAINE, COMMON 08/06/2010  . Nocturia 12/19/2008  . Swelling of limb 11/05/2010  . THROMBOCYTOSIS 12/19/2008  . Impaired glucose tolerance 04/18/2011  . Bladder neck obstruction 04/18/2011   Past Surgical History  Procedure Date  . Appendectomy   . Tonsillectomy   . S/p esophageal dilation     reports that he has quit smoking. He does not have any smokeless tobacco history on file. He reports that he drinks alcohol. He reports that he does not use illicit drugs. family history includes Cancer in his father and Hypertension in his brother, mother, and sister. No Known Allergies Current Outpatient Prescriptions on File Prior to Visit  Medication Sig Dispense  Refill  . aspirin 81 MG tablet Take 81 mg by mouth daily.        Marland Kitchen lovastatin (MEVACOR) 40 MG tablet Take 1 tablet (40 mg total) by mouth daily.  90 tablet  3  . vardenafil (LEVITRA) 20 MG tablet Take 1 tablet (20 mg total) by mouth as needed for erectile dysfunction.  10 tablet  5   Review of Systems Review of Systems  Constitutional: Negative for diaphoresis and unexpected weight change.  HENT: Negative for drooling and tinnitus.   Eyes: Negative for photophobia and visual disturbance.  Respiratory: Negative for choking and stridor.   Gastrointestinal: Negative for vomiting and blood in stool.  Genitourinary: Negative for hematuria and decreased urine volume.  Musculoskeletal: Negative for gait problem.      Objective:   Physical Exam BP 150/80  Pulse 90  Temp(Src) 98.3 F (36.8 C) (Oral)  Ht 5\' 9"  (1.753 m)  Wt 175 lb 8 oz (79.606 kg)  BMI 25.92 kg/m2  SpO2 96% Physical Exam  VS noted Constitutional: Pt appears well-developed and well-nourished.  HENT: Head: Normocephalic.  Right Ear: External ear normal.  Left Ear: External ear normal.  Eyes: Conjunctivae and EOM are normal. Pupils are equal, round, and reactive to light.  Neck: Normal range of motion. Neck supple.  Cardiovascular: Normal rate and regular rhythm.   Pulmonary/Chest: Effort normal and breath sounds normal.  Abd:  Soft, NT, non-distended, + BS Neurological: Pt is alert. No cranial nerve deficit.  Skin: Skin is warm. No erythema. left post arm with "golf  ball" sized subq lump to the post shoulder, bruise noted to post arm and medial elbow/anteroir arm as well, with mid right arm with 3x2 cm are warm, tender, mild swelling as well, no fluctuance or drainage Psychiatric: Pt behavior is normal. Thought content normal.   Review of Systems     Objective:   Physical Exam        Assessment & Plan:

## 2011-07-11 NOTE — Patient Instructions (Signed)
Take all new medications as prescribed Continue all other medications as before Please return if not improving the next 2-3 days with less pain/warmth/swelling (or sooner if worse, or high fever)

## 2011-10-26 ENCOUNTER — Telehealth: Payer: Self-pay | Admitting: Oncology

## 2011-10-26 NOTE — Telephone Encounter (Signed)
l/m and mailed 12/17/11 appt info aom

## 2011-12-17 ENCOUNTER — Other Ambulatory Visit: Payer: Self-pay | Admitting: *Deleted

## 2011-12-17 ENCOUNTER — Other Ambulatory Visit (HOSPITAL_BASED_OUTPATIENT_CLINIC_OR_DEPARTMENT_OTHER): Payer: Medicare Other

## 2011-12-17 ENCOUNTER — Ambulatory Visit (HOSPITAL_BASED_OUTPATIENT_CLINIC_OR_DEPARTMENT_OTHER): Payer: Medicare Other | Admitting: Oncology

## 2011-12-17 ENCOUNTER — Telehealth: Payer: Self-pay | Admitting: Oncology

## 2011-12-17 VITALS — BP 156/86 | HR 71 | Temp 97.9°F | Ht 69.0 in | Wt 191.0 lb

## 2011-12-17 DIAGNOSIS — I749 Embolism and thrombosis of unspecified artery: Secondary | ICD-10-CM

## 2011-12-17 DIAGNOSIS — D473 Essential (hemorrhagic) thrombocythemia: Secondary | ICD-10-CM

## 2011-12-17 DIAGNOSIS — D759 Disease of blood and blood-forming organs, unspecified: Secondary | ICD-10-CM

## 2011-12-17 LAB — CBC WITH DIFFERENTIAL/PLATELET
BASO%: 0.6 % (ref 0.0–2.0)
Basophils Absolute: 0.1 10*3/uL (ref 0.0–0.1)
EOS%: 3.1 % (ref 0.0–7.0)
HCT: 57.2 % — ABNORMAL HIGH (ref 38.4–49.9)
HGB: 18 g/dL — ABNORMAL HIGH (ref 13.0–17.1)
LYMPH%: 17.8 % (ref 14.0–49.0)
MCH: 25.6 pg — ABNORMAL LOW (ref 27.2–33.4)
MCHC: 31.5 g/dL — ABNORMAL LOW (ref 32.0–36.0)
MCV: 81.3 fL (ref 79.3–98.0)
MONO%: 5.3 % (ref 0.0–14.0)
NEUT%: 73.2 % (ref 39.0–75.0)
Platelets: 639 10*3/uL — ABNORMAL HIGH (ref 140–400)

## 2011-12-17 LAB — COMPREHENSIVE METABOLIC PANEL
AST: 14 U/L (ref 0–37)
Albumin: 4.3 g/dL (ref 3.5–5.2)
Alkaline Phosphatase: 60 U/L (ref 39–117)
BUN: 11 mg/dL (ref 6–23)
Creatinine, Ser: 1.13 mg/dL (ref 0.50–1.35)
Glucose, Bld: 149 mg/dL — ABNORMAL HIGH (ref 70–99)
Potassium: 4.8 mEq/L (ref 3.5–5.3)

## 2011-12-17 MED ORDER — HYDROXYUREA 500 MG PO CAPS: 500.0000 mg | ORAL_CAPSULE | Freq: Two times a day (BID) | ORAL | Status: DC

## 2011-12-17 NOTE — Progress Notes (Signed)
Hematology and Oncology Follow Up Visit  Dillon [name] [id number] 08-23-1946 66 y.o. 12/17/2011 10:00 AM  CC: Dillon Levins, MD    Principle Diagnosis: This is a 66 year old gentleman with a thrombocytosis JAK2 positive diagnosed in March 2010 presented as a form of myeloproliferative disorder confirmed by a bone marrow biopsy.   Current therapy: He is on aspirin 81 mg daily.  He is under considerations to start hydroxyurea at this time.  Interim History: Dillon [name] presents today for a followup visit.  He is a 66 year old gentleman with a myeloproliferative disorder outlined above.  He has continued to do very well at this time without any major problems to report since the last time I saw him.  He did not report any chest pain or difficulty breathing.  He did not report any nausea.  He did not report any vomiting.  He has not had any thrombotic events or hospitalization.  He has continued to perform activities of daily living without any major changes since the last time I saw him.  He has not had any bleeding diathesis.  He had not reported any epistaxis or vascular headaches or migraines.  Medications: I have reviewed the patient's current medications. Current outpatient prescriptions:aspirin 81 MG tablet, Take 81 mg by mouth daily.  , Disp: , Rfl: ;  lovastatin (MEVACOR) 40 MG tablet, Take 1 tablet (40 mg total) by mouth daily., Disp: 90 tablet, Rfl: 3;  vardenafil (LEVITRA) 20 MG tablet, Take 1 tablet (20 mg total) by mouth as needed for erectile dysfunction., Disp: 10 tablet, Rfl: 5  Allergies: No Known Allergies  Past Medical History, Surgical history, Social history, and Family History were reviewed and updated.  Review of Systems: Constitutional:  Negative for fever, chills, night sweats, anorexia, weight loss, pain. Cardiovascular: no chest pain or dyspnea on exertion Respiratory: negative Neurological: negative Dermatological: negative ENT: negative Skin:  Negative. Gastrointestinal: negative Genito-Urinary: negative Hematological and Lymphatic: negative Breast: negative Musculoskeletal: negative Remaining ROS negative. Physical Exam: Blood pressure 156/86, pulse 71, temperature 97.9 F (36.6 C), temperature source Oral, height 5\' 9"  (1.753 m), weight 191 lb (86.637 kg). ECOG: 1 General appearance: alert Head: Normocephalic, without obvious abnormality, atraumatic Neck: no adenopathy, no carotid bruit, no JVD, supple, symmetrical, trachea midline and thyroid not enlarged, symmetric, no tenderness/mass/nodules Lymph nodes: Cervical, supraclavicular, and axillary nodes normal. Heart:regular rate and rhythm, S1, S2 normal, no murmur, click, rub or gallop Lung:chest clear, no wheezing, rales, normal symmetric air entry Abdomin: soft, non-tender, without masses or organomegaly EXT:no erythema, induration, or nodules   Lab Results: Lab Results  Component Value Date   WBC 10.0 12/17/2011   HGB 18.0* 12/17/2011   HCT 57.2* 12/17/2011   MCV 81.3 12/17/2011   PLT 639* 12/17/2011     Chemistry      Component Value Date/Time   NA 138 06/19/2011 0930   K 4.5 06/19/2011 0930   CL 105 06/19/2011 0930   CO2 22 06/19/2011 0930   BUN 16 06/19/2011 0930   CREATININE 1.08 06/19/2011 0930      Component Value Date/Time   CALCIUM 9.5 06/19/2011 0930   ALKPHOS 52 06/19/2011 0930   AST 20 06/19/2011 0930   ALT 13 06/19/2011 0930   BILITOT 1.0 06/19/2011 0930       Impression and Plan:  ASSESSMENT/PLAN:  This is a pleasant 66 year old gentleman with the following issues. 1. Essential thrombocythemia.  His platelet count continues to increase and now he is developing polycythemia.  At  the time being given the fact that his platelet count are increasing, I will initiate hydroxyurea at 500 mg bid. Risks and benefits discussed today and he is agreeable to start. I will check his CBC in one month and check for any toxicities at that time.   2. Thrombosis  prophylaxis.  He continues to be on low-dose aspirin.  Followup will be in 6 months' time, sooner if any problems are reported. 3. Follow up in 4 weeks.    [facility], MD 4/2/201310:00 AM

## 2011-12-17 NOTE — Telephone Encounter (Signed)
Gv pt appt for may2013 

## 2011-12-29 ENCOUNTER — Emergency Department (INDEPENDENT_AMBULATORY_CARE_PROVIDER_SITE_OTHER): Payer: Medicare Other

## 2011-12-29 ENCOUNTER — Emergency Department (INDEPENDENT_AMBULATORY_CARE_PROVIDER_SITE_OTHER)
Admission: EM | Admit: 2011-12-29 | Discharge: 2011-12-29 | Disposition: A | Discharge status: Home / Self Care | Payer: Medicare Other | Source: Home / Self Care | Attending: Emergency Medicine | Admitting: Emergency Medicine

## 2011-12-29 ENCOUNTER — Encounter (HOSPITAL_COMMUNITY): Payer: Self-pay

## 2011-12-29 DIAGNOSIS — S46009A Unspecified injury of muscle(s) and tendon(s) of the rotator cuff of unspecified shoulder, initial encounter: Secondary | ICD-10-CM

## 2011-12-29 DIAGNOSIS — S4980XA Other specified injuries of shoulder and upper arm, unspecified arm, initial encounter: Secondary | ICD-10-CM | POA: Diagnosis not present

## 2011-12-29 DIAGNOSIS — M25519 Pain in unspecified shoulder: Secondary | ICD-10-CM | POA: Diagnosis not present

## 2011-12-29 DIAGNOSIS — M19019 Primary osteoarthritis, unspecified shoulder: Secondary | ICD-10-CM | POA: Diagnosis not present

## 2011-12-29 IMAGING — CR DG SHOULDER 2+V*R*
3 series · 3 of 3 positions shown · non-contrast
Comparison: None.

CLINICAL DATA: Right shoulder pain

RIGHT SHOULDER - 2+ VIEW

[view not recorded (1 of 3)]
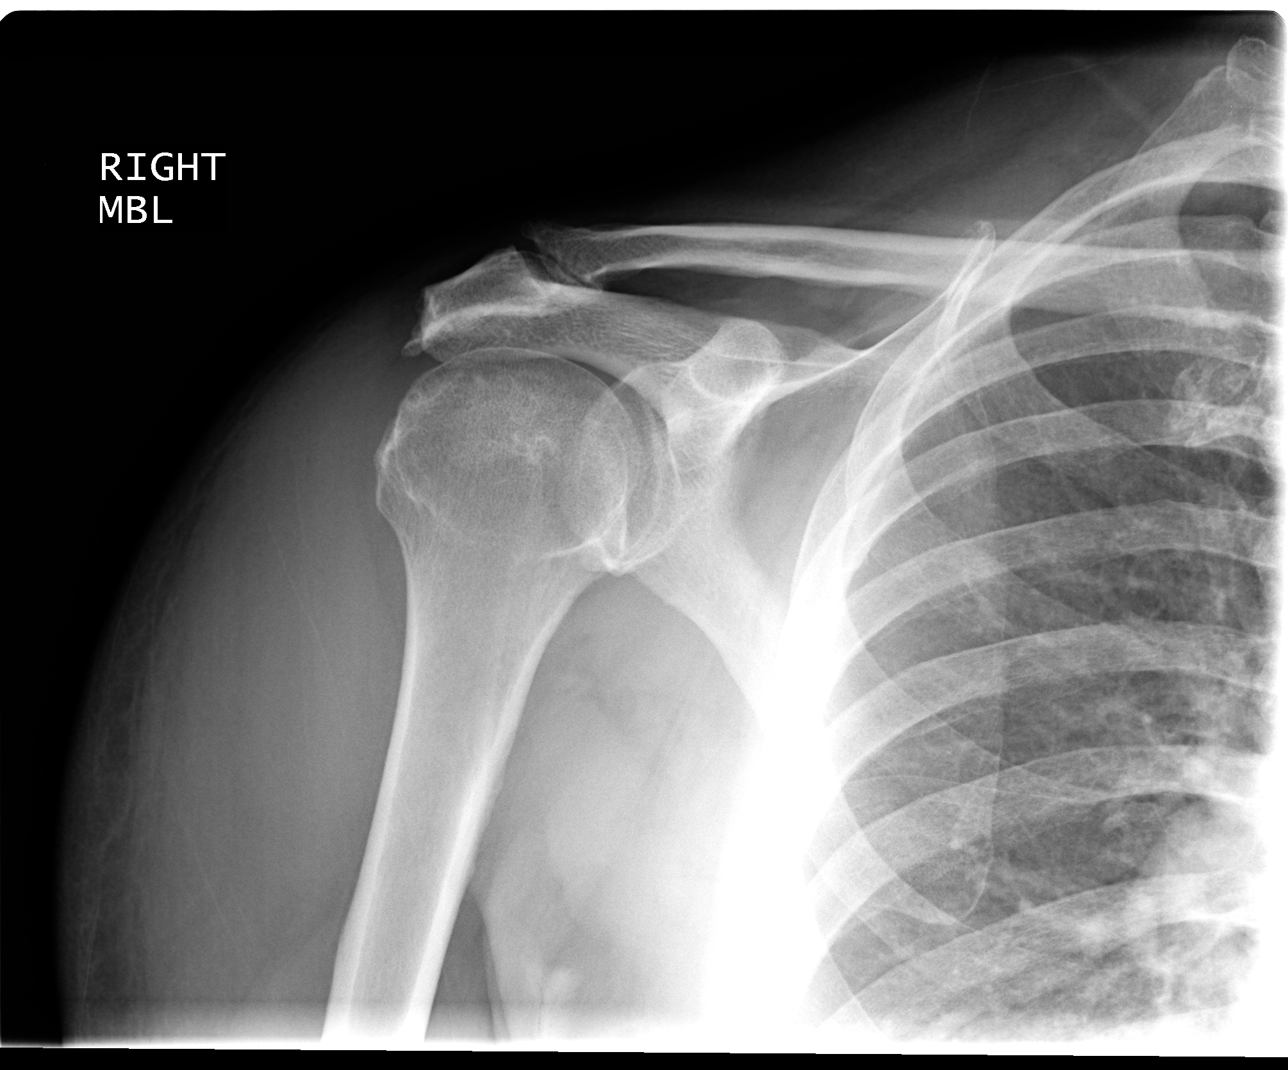

[view not recorded (2 of 3)]
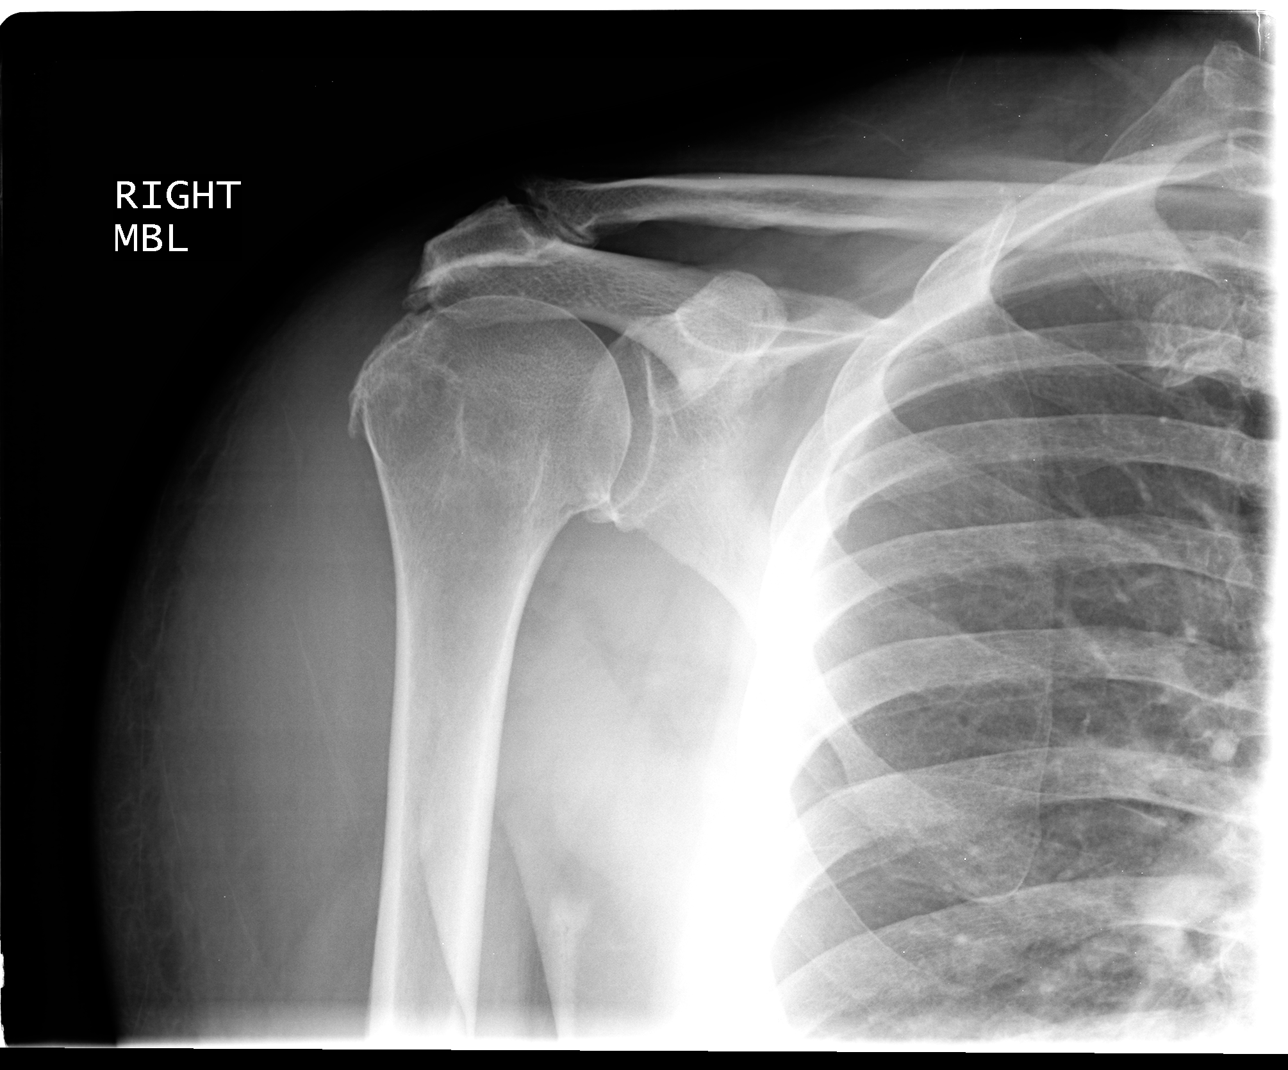

[view not recorded (3 of 3)]
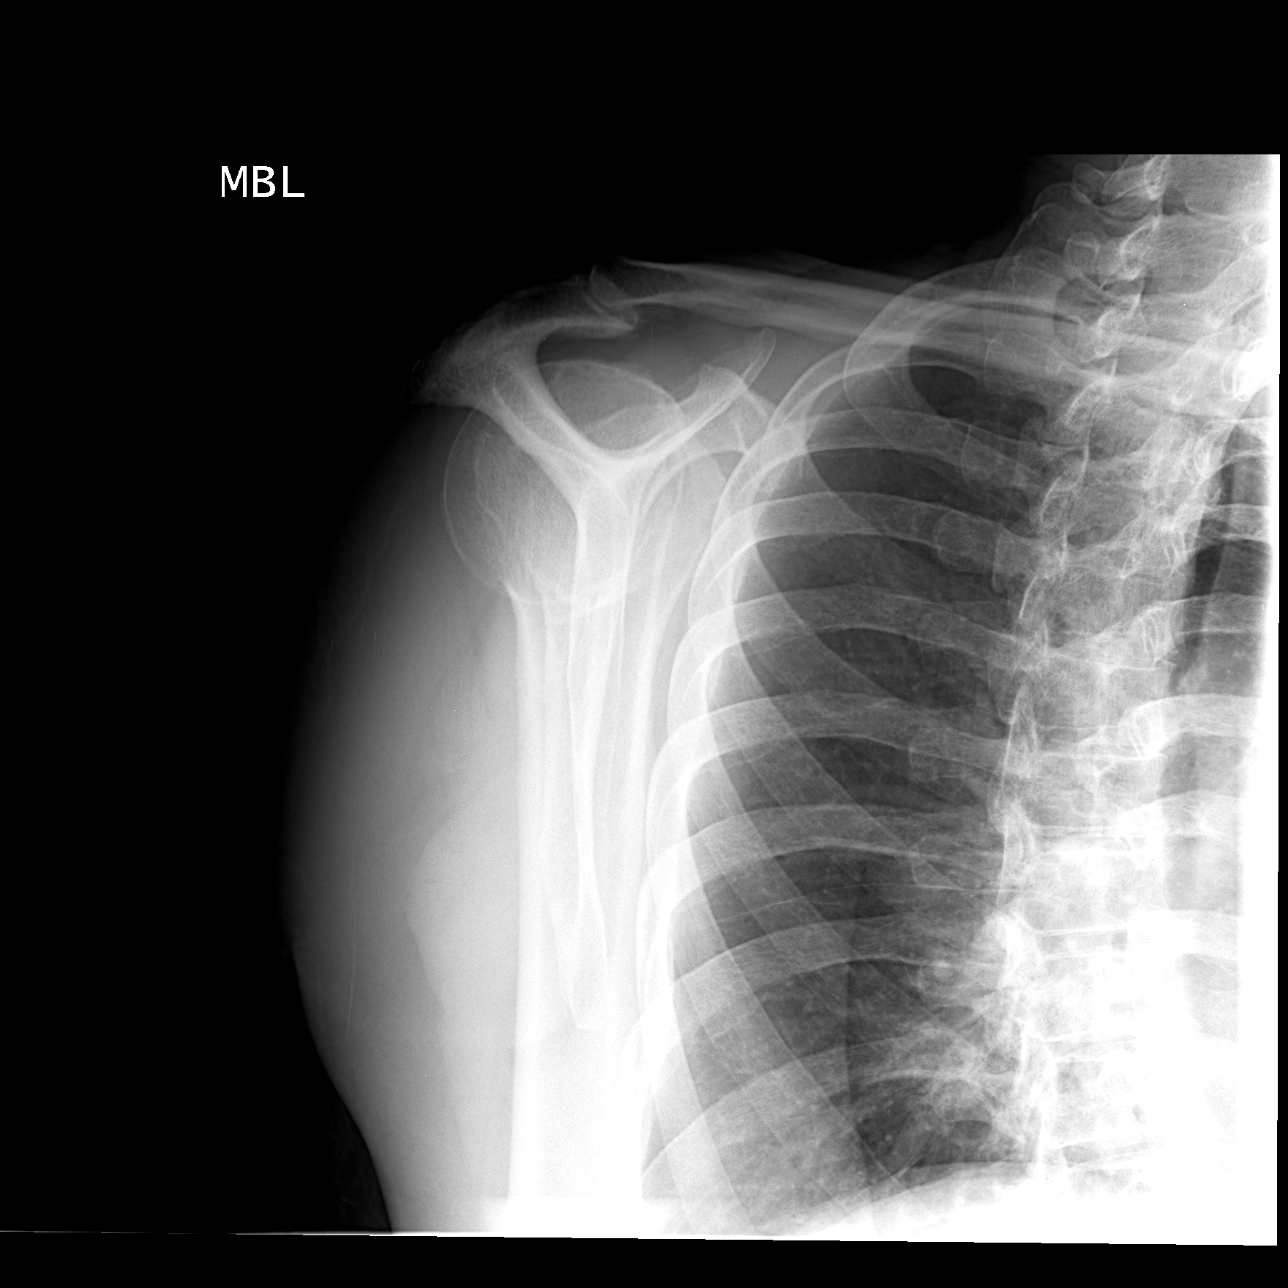

[3 of 3 positions shown; findings below may reference images not displayed]

FINDINGS: No fracture dislocation of the shoulder.  There is mild
degenerative change at the acromioclavicular joint..  There is mild
spurring of the posterior humeral head.
IMPRESSION: 1..  No fracture or dislocation.
2.  Mild arthropathy as above.

## 2011-12-29 MED ORDER — HYDROCODONE-ACETAMINOPHEN 5-325 MG PO TABS: 2.0000 | ORAL_TABLET | ORAL | Status: AC | PRN

## 2011-12-29 NOTE — ED Provider Notes (Signed)
History     CSN: [id number]  Arrival date & time 12/29/11  4098   First MD Initiated Contact with Patient 12/29/11 1028      Chief Complaint  Patient presents with  . Shoulder Pain    (Consider location/radiation/quality/duration/timing/severity/associated sxs/prior treatment) HPI Comments: Patient with 3 days of right shoulder pain, swelling along lateral aspect of the shoulder.. Denies any  remote history of injury to the shoulder. Denies insect bite. Reports new activity: States he has been mowing his lawn recently, and has had to pull "very hard" on the starter to get going.  Pain is worse with use, and he states he is unable to lift his arm above his head without pain. Reports that the pain is waking him up at night, he is unable to lie on the shoulder. Has been applying a warm compress without improvement.  Reports hitting his right hand a week ago, with some swelling, but this is resolving. No weakness, numbness, sensation of instability of the shoulder. No nausea, vomiting, fevers. No swelling in forearm. Patient has an extensive medical history, including a myeloproliferative disorder with hypercoagulability.   ROS as noted in HPI. All other ROS negative.   Patient is a 65 y.o. male presenting with shoulder pain. The history is provided by the patient. No language interpreter was used.  Shoulder Pain This is a new problem. The problem occurs constantly. The problem has not changed since onset.Pertinent negatives include no chest pain. The symptoms are relieved by nothing. He has tried a warm compress for the symptoms. The treatment provided no relief.    Past Medical History  Diagnosis Date  . ERECTILE DYSFUNCTION 11/15/2008  . FATIGUE 11/15/2008  . GERD 11/15/2008  . HYPERLIPIDEMIA 11/15/2008  . LEG PAIN, LEFT 11/05/2010  . MIGRAINE, COMMON 08/06/2010  . Nocturia 12/19/2008  . Swelling of limb 11/05/2010  . THROMBOCYTOSIS 12/19/2008    myleoproliferative disorder  . Impaired glucose  tolerance 04/18/2011  . Bladder neck obstruction 04/18/2011    Past Surgical History  Procedure Date  . Appendectomy   . Tonsillectomy   . S/p esophageal dilation     Family History  Problem Relation Age of Onset  . Hypertension Mother   . Cancer Father     prostate cancer  . Hypertension Sister   . Hypertension Brother     History  Substance Use Topics  . Smoking status: Former Games developer  . Smokeless tobacco: Not on file  . Alcohol Use: Yes      Review of Systems  Cardiovascular: Negative for chest pain.    Allergies  Review of patient's allergies indicates no known allergies.  Home Medications   Current Outpatient Rx  Name Route Sig Dispense Refill  . ASPIRIN 81 MG PO TABS Oral Take 81 mg by mouth daily.      Marland Kitchen HYDROCODONE-ACETAMINOPHEN 5-325 MG PO TABS Oral Take 2 tablets by mouth every 4 (four) hours as needed for pain. 20 tablet 0  . HYDROXYUREA 500 MG PO CAPS Oral Take 1 capsule (500 mg total) by mouth 2 (two) times daily. May take with food to minimize GI side effects. 60 capsule 1    1 tablet twice daily  . LOVASTATIN 40 MG PO TABS Oral Take 1 tablet (40 mg total) by mouth daily. 90 tablet 3  . VARDENAFIL HCL 20 MG PO TABS Oral Take 1 tablet (20 mg total) by mouth as needed for erectile dysfunction. 10 tablet 5    BP 170/94  Pulse  72  Temp(Src) 99 F (37.2 C) (Oral)  Resp 16  SpO2 97% Filed Vitals:   12/29/11 1143  BP: 170/94  Pulse: 72  Temp:   Resp: 16    Physical Exam  Nursing note and vitals reviewed. Constitutional: He is oriented to person, place, and time. He appears well-developed and well-nourished.  HENT:  Head: Normocephalic and atraumatic.  Eyes: Conjunctivae and EOM are normal.  Neck: Normal range of motion.  Cardiovascular: Normal rate.   Pulmonary/Chest: Effort normal. No respiratory distress.  Abdominal: He exhibits no distension.  Musculoskeletal:       Right shoulder: He exhibits decreased range of motion. He exhibits no  crepitus.       R shoulder swelling, asymmetry. Right shoulder with ROM somewhat limited, Drop test abnormal,  clavicle NT, A/C joint NTscapula NT, proximal humerus NT, shoulder joint  Tender, positive liftoff test, positive empty can test,  pain with abduction, external rotation, but no instability  Motor strength decreased at shoulder due to pain, Sensation intact LT over deltoid region, distal NVI with hand on affected side having intact sensation and strength in the distribution of the median, radial, and ulnar nerve   Neurological: He is alert and oriented to person, place, and time.  Skin: Skin is warm and dry.  Psychiatric: He has a normal mood and affect. His behavior is normal.    ED Course  Procedures (including critical care time)    1. Rotator cuff injury     Imaging reviewed by myself. Report per radiologist. Dg Shoulder Right  12/29/2011  *RADIOLOGY REPORT*  Clinical Data: Right shoulder pain  RIGHT SHOULDER - 2+ VIEW  Comparison: None.  Findings: No fracture dislocation of the shoulder.  There is mild degenerative change at the acromioclavicular joint.  There is mild spurring of the posterior humeral head.  IMPRESSION:  1.  No fracture or dislocation. 2.  Mild arthropathy as above.  Original Report Authenticated By: Genevive Bi, M.D.     MDM  Previous records reviewed. Additional medical history obtained.   Blood pressure previous visits 150/80,156/86. Blood pressure noted. Pressures trended down on department. Patient is otherwise asymptomatic. Will have him followup with his primary care physician about this.   Patient with asymmetric shoulders. Is unable to fully AB duct his right shoulder to 90 degrees,  however, is able to raise it up over his head once he reaches that point. Has a positive liftoff test. Suspect rotator cuff tear. No evidence of thrombosis or infection at this time. checking x-ray. If negative, place and a shoulder immobilizer, and have him followup  with Dr. August Saucer, orthopedist on call in a week. Advised patient to take his shoulder the immobilizer several times a day admitted through range of motion to prevent adhesive capsulitis. Will also have him sleep with a pillow underneath his arm for comfort at night.  Luiz Blare, MD 12/30/11 2234

## 2011-12-29 NOTE — Discharge Instructions (Signed)
Wear the shoulder immobilizer for comfort. Try putting a pillow underneath your arm at night to help with the pain. There is evidence of a fracture on today's x-ray, but I suspect he may have torn some ligaments in his shoulder. He needs to be seen by an orthopedic surgeon. Start using ice on your shoulder for 20 minutes at a time. Take your shoulder of the immobilizer several times a day so that it does not stiffen up. Return if you've a fever above 100.4, if you do worse, or for any other concerns.

## 2011-12-29 NOTE — ED Notes (Signed)
Pt has rt shoulder pain and swelling for three days.  He bumped his rt hand on Monday and has edema to hand.  He worked in the yard yesterday and was unable to crank his weed eater due to the pain.

## 2011-12-29 NOTE — ED Notes (Signed)
Patient made of radiology delays and had no needs at this time

## 2012-01-13 DIAGNOSIS — M25519 Pain in unspecified shoulder: Secondary | ICD-10-CM | POA: Diagnosis not present

## 2012-01-16 ENCOUNTER — Other Ambulatory Visit (HOSPITAL_BASED_OUTPATIENT_CLINIC_OR_DEPARTMENT_OTHER): Payer: Medicare Other | Admitting: Lab

## 2012-01-16 ENCOUNTER — Encounter: Payer: Self-pay | Admitting: Oncology

## 2012-01-16 ENCOUNTER — Telehealth: Payer: Self-pay | Admitting: Oncology

## 2012-01-16 ENCOUNTER — Ambulatory Visit (HOSPITAL_BASED_OUTPATIENT_CLINIC_OR_DEPARTMENT_OTHER): Payer: Medicare Other | Admitting: Oncology

## 2012-01-16 VITALS — BP 152/88 | HR 68 | Temp 98.9°F | Ht 69.0 in | Wt 186.3 lb

## 2012-01-16 DIAGNOSIS — Z7982 Long term (current) use of aspirin: Secondary | ICD-10-CM

## 2012-01-16 DIAGNOSIS — D759 Disease of blood and blood-forming organs, unspecified: Secondary | ICD-10-CM

## 2012-01-16 DIAGNOSIS — D473 Essential (hemorrhagic) thrombocythemia: Secondary | ICD-10-CM

## 2012-01-16 LAB — CBC WITH DIFFERENTIAL/PLATELET
BASO%: 0.5 % (ref 0.0–2.0)
Eosinophils Absolute: 0.1 10*3/uL (ref 0.0–0.5)
MCHC: 31.4 g/dL — ABNORMAL LOW (ref 32.0–36.0)
MCV: 82.4 fL (ref 79.3–98.0)
MONO%: 6.7 % (ref 0.0–14.0)
NEUT#: 6 10*3/uL (ref 1.5–6.5)
RBC: 6.96 10*6/uL — ABNORMAL HIGH (ref 4.20–5.82)
RDW: 19.9 % — ABNORMAL HIGH (ref 11.0–14.6)
WBC: 8.4 10*3/uL (ref 4.0–10.3)
nRBC: 0 % (ref 0–0)

## 2012-01-16 LAB — COMPREHENSIVE METABOLIC PANEL
ALT: 16 U/L (ref 0–53)
AST: 16 U/L (ref 0–37)
Alkaline Phosphatase: 60 U/L (ref 39–117)
CO2: 26 mEq/L (ref 19–32)
Sodium: 139 mEq/L (ref 135–145)
Total Bilirubin: 0.9 mg/dL (ref 0.3–1.2)
Total Protein: 6.9 g/dL (ref 6.0–8.3)

## 2012-01-16 NOTE — Progress Notes (Signed)
Hematology and Oncology Follow Up Visit  Dillon [name] [id number] March 19, 1946 66 y.o. 01/16/2012 8:46 AM  CC: Dillon Levins, MD    Principle Diagnosis: This is a 66 year old gentleman with a thrombocytosis JAK2 positive diagnosed in March 2010 presented as a form of myeloproliferative disorder confirmed by a bone marrow biopsy.   Current therapy:  1. Hydrea 500 mg twice a day. 2. He is on aspirin 81 mg daily.    Interim History: Dillon [name] presents today for a followup visit.  He is a 66 year old gentleman with a myeloproliferative disorder outlined above. He was started on Hydrea 500 mg twice a day about 1 month ago.  He did not report any chest pain or difficulty breathing.  He did not report any nausea.  He did not report any vomiting.  He has not had any thrombotic events or hospitalization.  He has continued to perform activities of daily living without any major changes since the last time I saw him.  He has not had any bleeding.  He had not reported any epistaxis or vascular headaches or migraines.  Medications: I have reviewed the patient's current medications. Current outpatient prescriptions:aspirin 81 MG tablet, Take 81 mg by mouth daily.  , Disp: , Rfl: ;  hydroxyurea (HYDREA) 500 MG capsule, Take 1 capsule (500 mg total) by mouth 2 (two) times daily. May take with food to minimize GI side effects., Disp: 60 capsule, Rfl: 1;  lovastatin (MEVACOR) 40 MG tablet, Take 1 tablet (40 mg total) by mouth daily., Disp: 90 tablet, Rfl: 3 vardenafil (LEVITRA) 20 MG tablet, Take 1 tablet (20 mg total) by mouth as needed for erectile dysfunction., Disp: 10 tablet, Rfl: 5  Allergies: No Known Allergies  Past Medical History, Surgical history, Social history, and Family History were reviewed and updated.  Review of Systems: Constitutional:  Negative for fever, chills, night sweats, anorexia, weight loss, pain. Cardiovascular: no chest pain or dyspnea on exertion Respiratory:  negative Neurological: negative Dermatological: negative ENT: negative Skin: Negative. Gastrointestinal: negative Genito-Urinary: negative Hematological and Lymphatic: negative Breast: negative Musculoskeletal: negative Remaining ROS negative.  Physical Exam: Blood pressure 152/88, pulse 68, temperature 98.9 F (37.2 C), temperature source Oral, height 5\' 9"  (1.753 m), weight 186 lb 4.8 oz (84.505 kg). ECOG: 1 General appearance: alert Head: Normocephalic, without obvious abnormality, atraumatic Neck: no adenopathy, no carotid bruit, no JVD, supple, symmetrical, trachea midline and thyroid not enlarged, symmetric, no tenderness/mass/nodules Lymph nodes: Cervical, supraclavicular, and axillary nodes normal. Heart:regular rate and rhythm, S1, S2 normal, no murmur, click, rub or gallop Lung:chest clear, no wheezing, rales, normal symmetric air entry Abdomin: soft, non-tender, without masses or organomegaly EXT:no erythema, induration, or nodules  Lab Results: Lab Results  Component Value Date   WBC 8.4 01/16/2012   HGB 18.0* 01/16/2012   HCT 57.3* 01/16/2012   MCV 82.4 01/16/2012   PLT 463* 01/16/2012     Chemistry      Component Value Date/Time   NA 140 12/17/2011 0935   K 4.8 12/17/2011 0935   CL 107 12/17/2011 0935   CO2 23 12/17/2011 0935   BUN 11 12/17/2011 0935   CREATININE 1.13 12/17/2011 0935      Component Value Date/Time   CALCIUM 10.1 12/17/2011 0935   ALKPHOS 60 12/17/2011 0935   AST 14 12/17/2011 0935   ALT 12 12/17/2011 0935   BILITOT 0.7 12/17/2011 0935     Impression and Plan:  ASSESSMENT/PLAN:  This is a pleasant 66 year old gentleman with the  following issues. 1. Essential thrombocythemia. On Hydrea 500 mg twice a day and tolerating this medication well. Platelet count is down to 463,000. Continue 500 mg twice a day. 2. Thrombosis prophylaxis.  He continues to be on low-dose aspirin.   3. Follow up in 5 weeks.   Case reviewed with Dillon [name].   Dillon [name],  [place] 5/2/20138:46 AM

## 2012-01-16 NOTE — Patient Instructions (Signed)
Continue Hydrea 500 mg twice a day.  Return to our office in 5 weeks to recheck blood counts.

## 2012-01-16 NOTE — Telephone Encounter (Signed)
APPTS MADE AND PRINTED FOR PT AOM °

## 2012-02-19 ENCOUNTER — Other Ambulatory Visit: Payer: Medicare Other | Admitting: Lab

## 2012-02-19 ENCOUNTER — Ambulatory Visit: Payer: Medicare Other | Admitting: Oncology

## 2012-02-20 ENCOUNTER — Other Ambulatory Visit: Payer: Medicare Other | Admitting: Lab

## 2012-02-20 ENCOUNTER — Ambulatory Visit: Payer: Medicare Other | Admitting: Internal Medicine

## 2012-04-27 ENCOUNTER — Other Ambulatory Visit (INDEPENDENT_AMBULATORY_CARE_PROVIDER_SITE_OTHER): Payer: Medicare Other

## 2012-04-27 ENCOUNTER — Ambulatory Visit (INDEPENDENT_AMBULATORY_CARE_PROVIDER_SITE_OTHER): Payer: Medicare Other | Admitting: Internal Medicine

## 2012-04-27 ENCOUNTER — Encounter: Payer: Self-pay | Admitting: Internal Medicine

## 2012-04-27 VITALS — BP 104/68 | HR 65 | Temp 97.6°F | Ht 69.0 in | Wt 184.2 lb

## 2012-04-27 DIAGNOSIS — N320 Bladder-neck obstruction: Secondary | ICD-10-CM

## 2012-04-27 DIAGNOSIS — F528 Other sexual dysfunction not due to a substance or known physiological condition: Secondary | ICD-10-CM

## 2012-04-27 DIAGNOSIS — E785 Hyperlipidemia, unspecified: Secondary | ICD-10-CM

## 2012-04-27 DIAGNOSIS — D473 Essential (hemorrhagic) thrombocythemia: Secondary | ICD-10-CM

## 2012-04-27 DIAGNOSIS — D45 Polycythemia vera: Secondary | ICD-10-CM

## 2012-04-27 DIAGNOSIS — D759 Disease of blood and blood-forming organs, unspecified: Secondary | ICD-10-CM | POA: Diagnosis not present

## 2012-04-27 DIAGNOSIS — Z136 Encounter for screening for cardiovascular disorders: Secondary | ICD-10-CM

## 2012-04-27 DIAGNOSIS — R5381 Other malaise: Secondary | ICD-10-CM | POA: Diagnosis not present

## 2012-04-27 DIAGNOSIS — R7309 Other abnormal glucose: Secondary | ICD-10-CM | POA: Diagnosis not present

## 2012-04-27 DIAGNOSIS — R5383 Other fatigue: Secondary | ICD-10-CM

## 2012-04-27 DIAGNOSIS — I517 Cardiomegaly: Secondary | ICD-10-CM

## 2012-04-27 DIAGNOSIS — D751 Secondary polycythemia: Secondary | ICD-10-CM | POA: Insufficient documentation

## 2012-04-27 DIAGNOSIS — R7302 Impaired glucose tolerance (oral): Secondary | ICD-10-CM

## 2012-04-27 HISTORY — DX: Cardiomegaly: I51.7

## 2012-04-27 HISTORY — DX: Secondary polycythemia: D75.1

## 2012-04-27 LAB — URINALYSIS, ROUTINE W REFLEX MICROSCOPIC
Bilirubin Urine: NEGATIVE
Ketones, ur: NEGATIVE
Total Protein, Urine: NEGATIVE
Urine Glucose: NEGATIVE
pH: 6.5 (ref 5.0–8.0)

## 2012-04-27 LAB — HEPATIC FUNCTION PANEL
ALT: 19 U/L (ref 0–53)
AST: 21 U/L (ref 0–37)
Total Protein: 7.5 g/dL (ref 6.0–8.3)

## 2012-04-27 LAB — CBC WITH DIFFERENTIAL/PLATELET
Basophils Relative: 0.2 % (ref 0.0–3.0)
Eosinophils Absolute: 0.4 10*3/uL (ref 0.0–0.7)
Eosinophils Relative: 3.4 % (ref 0.0–5.0)
Lymphocytes Relative: 18.6 % (ref 12.0–46.0)
MCHC: 31.4 g/dL (ref 30.0–36.0)
MCV: 92.7 fl (ref 78.0–100.0)
Monocytes Absolute: 0.5 10*3/uL (ref 0.1–1.0)
Neutrophils Relative %: 73.2 % (ref 43.0–77.0)
Platelets: 688 10*3/uL — ABNORMAL HIGH (ref 150.0–400.0)
RBC: 5.8 Mil/uL (ref 4.22–5.81)
WBC: 10.7 10*3/uL — ABNORMAL HIGH (ref 4.5–10.5)

## 2012-04-27 LAB — BASIC METABOLIC PANEL
BUN: 16 mg/dL (ref 6–23)
CO2: 29 mEq/L (ref 19–32)
Chloride: 102 mEq/L (ref 96–112)
Creatinine, Ser: 0.9 mg/dL (ref 0.4–1.5)
Potassium: 5 mEq/L (ref 3.5–5.1)

## 2012-04-27 LAB — LIPID PANEL
Cholesterol: 192 mg/dL (ref 0–200)
Triglycerides: 99 mg/dL (ref 0.0–149.0)

## 2012-04-27 LAB — TSH: TSH: 1.45 u[IU]/mL (ref 0.35–5.50)

## 2012-04-27 LAB — HEMOGLOBIN A1C: Hgb A1c MFr Bld: 5.6 % (ref 4.6–6.5)

## 2012-04-27 LAB — PSA: PSA: 1.14 ng/mL (ref 0.10–4.00)

## 2012-04-27 MED ORDER — HYDROXYUREA 500 MG PO CAPS: 500.0000 mg | ORAL_CAPSULE | Freq: Two times a day (BID) | ORAL | Status: AC

## 2012-04-27 MED ORDER — TADALAFIL 20 MG PO TABS: 20.0000 mg | ORAL_TABLET | Freq: Every day | ORAL | Status: DC | PRN

## 2012-04-27 MED ORDER — LOVASTATIN 40 MG PO TABS: 40.0000 mg | ORAL_TABLET | Freq: Every day | ORAL | Status: DC

## 2012-04-27 NOTE — Assessment & Plan Note (Signed)
Also due for PSA, has mild worsening prostatism but very mild and declines flomax trial at this time

## 2012-04-27 NOTE — Assessment & Plan Note (Signed)
Pt asks for change of levitra to cialis - cheaper at [employer]

## 2012-04-27 NOTE — Patient Instructions (Addendum)
Continue all other medications as before, including the hydrea Your EKG showed evidence for thickened heart muscle today You will be contacted regarding the referral for: Dr Clelia Croft, and the Echocardiogram Please go to LAB in the Basement for the blood and/or urine tests to be done today You will be contacted by phone if any changes need to be made immediately.  Otherwise, you will receive a letter about your results with an explanation. Please return in 6 months, or sooner if needed

## 2012-04-27 NOTE — Assessment & Plan Note (Signed)
Overdue for labs and f/u with heme; for cbc and refer back to Dr Clelia Croft, encouraged pt to re-start the hydrea

## 2012-04-27 NOTE — Assessment & Plan Note (Signed)
Mild, exam benign today, for TSH, and other eval as above, doubt OSA for now, Continue all other medications as before, cont same tx

## 2012-04-27 NOTE — Assessment & Plan Note (Signed)
stable overall by hx and exam, most recent data reviewed with pt, and pt to continue medical treatment as before Lab Results  Component Value Date   LDLCALC 107* 04/18/2011  for lab today, cont diet

## 2012-04-27 NOTE — Progress Notes (Signed)
Subjective:    Patient ID: Dillon [name], male    DOB: 13-Dec-1945, 66 y.o.   MRN: [id number]  HPI  Here for f/u;  Overall doing ok;  Pt denies CP, worsening SOB, DOE, wheezing, orthopnea, PND, worsening LE edema, palpitations, dizziness or syncope.  Pt denies neurological change such as new Headache, facial or extremity weakness.  Pt denies polydipsia, polyuria, or low sugar symptoms. Pt states overall good compliance with treatment and medications, good tolerability, and trying to follow lower cholesterol diet.  Pt denies worsening depressive symptoms, suicidal ideation or panic. No fever, wt loss, night sweats, loss of appetite, or other constitutional symptoms.  Pt states good ability with ADL's, low fall risk, home safety reviewed and adequate, no significant changes in hearing or vision, and occasionally active with exercise. Does have sense of ongoing fatigue, but denies signficant hypersomnolence. Has some mild prostatism but very mild, does not want to try flomax, tried before and helped only minimally.. Did stop the hydrea recently as he developed dark bands across the nails which seem to be "growing out" since he stopped.   Pt denies polydipsia, polyuria.  Asks for cialis instead of levitra as he knows of a Systems developer Past Medical History  Diagnosis Date  . ERECTILE DYSFUNCTION 11/15/2008  . FATIGUE 11/15/2008  . GERD 11/15/2008  . HYPERLIPIDEMIA 11/15/2008  . LEG PAIN, LEFT 11/05/2010  . MIGRAINE, COMMON 08/06/2010  . Nocturia 12/19/2008  . Swelling of limb 11/05/2010  . THROMBOCYTOSIS 12/19/2008    myleoproliferative disorder  . Impaired glucose tolerance 04/18/2011  . Bladder neck obstruction 04/18/2011  . Polycythemia 04/27/2012  . LVH (left ventricular hypertrophy) 04/27/2012   Past Surgical History  Procedure Date  . Appendectomy   . Tonsillectomy   . S/p esophageal dilation     reports that he has quit smoking. He does not have any smokeless tobacco history on file. He reports  that he drinks alcohol. He reports that he does not use illicit drugs. family history includes Cancer in his father and Hypertension in his brother, mother, and sister. No Known Allergies Current Outpatient Prescriptions on File Prior to Visit  Medication Sig Dispense Refill  . aspirin 81 MG tablet Take 81 mg by mouth daily.        Marland Kitchen DISCONTD: lovastatin (MEVACOR) 40 MG tablet Take 1 tablet (40 mg total) by mouth daily.  90 tablet  3  . tadalafil (CIALIS) 20 MG tablet Take 1 tablet (20 mg total) by mouth daily as needed for erectile dysfunction.  40 tablet  11  . vardenafil (LEVITRA) 20 MG tablet Take 1 tablet (20 mg total) by mouth as needed for erectile dysfunction.  10 tablet  5   Review of Systems Review of Systems  Constitutional: Negative for diaphoresis, activity change, appetite change and unexpected weight change.  HENT: Negative for hearing loss, ear pain, facial swelling, mouth sores and neck stiffness.   Eyes: Negative for pain, redness and visual disturbance.  Respiratory: Negative for shortness of breath and wheezing.   Cardiovascular: Negative for chest pain and palpitations.  Gastrointestinal: Negative for diarrhea, blood in stool, abdominal distention and rectal pain.  Genitourinary: Negative for hematuria, flank pain and decreased urine volume.  Musculoskeletal: Negative for myalgias and joint swelling.  Skin: Negative for color change and wound.  Neurological: Negative for syncope and numbness.  Hematological: Negative for adenopathy.  Psychiatric/Behavioral: Negative for hallucinations, self-injury, decreased concentration and agitation.      Objective:   Physical Exam  BP 104/68  Pulse 65  Temp 97.6 F (36.4 C) (Oral)  Ht 5\' 9"  (1.753 m)  Wt 184 lb 4 oz (83.575 kg)  BMI 27.21 kg/m2  SpO2 97% Physical Exam  VS noted Constitutional: Pt is oriented to person, place, and time. Appears well-developed and well-nourished.  Head: Normocephalic and atraumatic.    Right Ear: External ear normal.  Left Ear: External ear normal.  Nose: Nose normal.  Mouth/Throat: Oropharynx is clear and moist.  Eyes: Conjunctivae and EOM are normal. Pupils are equal, round, and reactive to light.  Neck: Normal range of motion. Neck supple. No JVD present. No tracheal deviation present.  Cardiovascular: Normal rate, regular rhythm, normal heart sounds and intact distal pulses.   Pulmonary/Chest: Effort normal and breath sounds normal.  Abdominal: Soft. Bowel sounds are normal. There is no tenderness.  Musculoskeletal: Normal range of motion. Exhibits no edema.  Lymphadenopathy:  Has no cervical adenopathy.  Neurological: Pt is alert and oriented to person, place, and time. Pt has normal reflexes. No cranial nerve deficit.  Skin: Skin is warm and dry. No rash noted. Dark lines approx 4 mm thick noted across each fingernail Psychiatric:  Has  normal mood and affect. Behavior is normal.     Assessment & Plan:

## 2012-04-27 NOTE — Assessment & Plan Note (Signed)
ECG reviewed as per emr - no change, for echo to further evalutate - r/o valve dz

## 2012-04-27 NOTE — Assessment & Plan Note (Signed)
asympt - for labs today

## 2012-04-27 NOTE — Assessment & Plan Note (Addendum)
?   Etiology, former smoker, doubt OSA, also to f/u heme, for lab today  Note:  Total time for pt hx, exam, review of record with pt in room, determination of diagnosis and plan for further eval and tx is > 40 min

## 2012-04-30 ENCOUNTER — Ambulatory Visit (HOSPITAL_COMMUNITY): Payer: Medicare Other | Attending: Cardiology | Admitting: Radiology

## 2012-04-30 DIAGNOSIS — I359 Nonrheumatic aortic valve disorder, unspecified: Secondary | ICD-10-CM | POA: Insufficient documentation

## 2012-04-30 DIAGNOSIS — Z87891 Personal history of nicotine dependence: Secondary | ICD-10-CM | POA: Diagnosis not present

## 2012-04-30 DIAGNOSIS — R9431 Abnormal electrocardiogram [ECG] [EKG]: Secondary | ICD-10-CM

## 2012-04-30 DIAGNOSIS — E785 Hyperlipidemia, unspecified: Secondary | ICD-10-CM | POA: Insufficient documentation

## 2012-04-30 DIAGNOSIS — I517 Cardiomegaly: Secondary | ICD-10-CM

## 2012-04-30 DIAGNOSIS — I10 Essential (primary) hypertension: Secondary | ICD-10-CM | POA: Insufficient documentation

## 2012-04-30 NOTE — Progress Notes (Signed)
Echocardiogram performed.  

## 2012-05-01 ENCOUNTER — Encounter: Payer: Self-pay | Admitting: Internal Medicine

## 2012-05-18 ENCOUNTER — Other Ambulatory Visit: Payer: Self-pay | Admitting: Oncology

## 2012-05-18 DIAGNOSIS — D759 Disease of blood and blood-forming organs, unspecified: Secondary | ICD-10-CM

## 2012-05-20 ENCOUNTER — Telehealth: Payer: Self-pay | Admitting: Oncology

## 2012-05-20 NOTE — Telephone Encounter (Signed)
lmonvm adviisng the pt of his lab and md appts on 05/21/2012

## 2012-05-21 ENCOUNTER — Ambulatory Visit: Payer: Medicare Other | Admitting: Oncology

## 2012-05-21 ENCOUNTER — Other Ambulatory Visit: Payer: Medicare Other | Admitting: Lab

## 2012-07-28 ENCOUNTER — Encounter: Payer: Self-pay | Admitting: Internal Medicine

## 2012-07-28 ENCOUNTER — Ambulatory Visit (INDEPENDENT_AMBULATORY_CARE_PROVIDER_SITE_OTHER): Payer: Medicare Other | Admitting: Internal Medicine

## 2012-07-28 VITALS — BP 140/90 | HR 69 | Temp 97.1°F | Ht 69.0 in | Wt 183.5 lb

## 2012-07-28 DIAGNOSIS — R7302 Impaired glucose tolerance (oral): Secondary | ICD-10-CM

## 2012-07-28 DIAGNOSIS — R7309 Other abnormal glucose: Secondary | ICD-10-CM

## 2012-07-28 DIAGNOSIS — Z23 Encounter for immunization: Secondary | ICD-10-CM | POA: Diagnosis not present

## 2012-07-28 DIAGNOSIS — D759 Disease of blood and blood-forming organs, unspecified: Secondary | ICD-10-CM

## 2012-07-28 DIAGNOSIS — T148XXA Other injury of unspecified body region, initial encounter: Secondary | ICD-10-CM | POA: Diagnosis not present

## 2012-07-28 NOTE — Assessment & Plan Note (Signed)
stable overall by hx and exam, most recent data reviewed with pt, and pt to continue medical treatment as before Lab Results  Component Value Date   HGBA1C 5.6 04/27/2012

## 2012-07-28 NOTE — Assessment & Plan Note (Signed)
D/w pt - not likely related to current bruising, I think ok to hold on f/u cbc at this time

## 2012-07-28 NOTE — Assessment & Plan Note (Signed)
Small, LLQ with surrounding bruise, on ASA, with hx of elevated plts, no bleeding d/c, and no other s/s bleeding/bruising, most likely related to work with carrying the boxes, minor symptomatic at this time, ok to cont aSA, for tylenol prn, would expect complete resolution in 3-4 wks, to f/u for any worsening s/s

## 2012-07-28 NOTE — Progress Notes (Signed)
Subjective:    Patient ID: Dillon [name], male    DOB: 13-Mar-1946, 66 y.o.   MRN: [id number]  HPI  Here with new onset tender bruised area to LLQ, doesn't remember specific trauma but has been working lifting and carrying 40 lb boxes.  No other bruises, bleeding.  Is on ASA, no other known liver dz, or bleeding diathisis.  Pt denies chest pain, increased sob or doe, wheezing, orthopnea, PND, increased LE swelling, palpitations, dizziness or syncope.   Pt denies polydipsia, polyuria.  Pt denies new neurological symptoms such as new headache, or facial or extremity weakness or numbness.  Does have hx of chronnic mild elevated platelets. Past Medical History  Diagnosis Date  . ERECTILE DYSFUNCTION 11/15/2008  . FATIGUE 11/15/2008  . GERD 11/15/2008  . HYPERLIPIDEMIA 11/15/2008  . LEG PAIN, LEFT 11/05/2010  . MIGRAINE, COMMON 08/06/2010  . Nocturia 12/19/2008  . Swelling of limb 11/05/2010  . THROMBOCYTOSIS 12/19/2008    myleoproliferative disorder  . Impaired glucose tolerance 04/18/2011  . Bladder neck obstruction 04/18/2011  . Polycythemia 04/27/2012  . LVH (left ventricular hypertrophy) 04/27/2012   Past Surgical History  Procedure Date  . Appendectomy   . Tonsillectomy   . S/p esophageal dilation     reports that he has quit smoking. He does not have any smokeless tobacco history on file. He reports that he drinks alcohol. He reports that he does not use illicit drugs. family history includes Cancer in his father and Hypertension in his brother, mother, and sister. No Known Allergies Current Outpatient Prescriptions on File Prior to Visit  Medication Sig Dispense Refill  . aspirin 81 MG tablet Take 81 mg by mouth daily.        Marland Kitchen lovastatin (MEVACOR) 40 MG tablet Take 1 tablet (40 mg total) by mouth daily.  90 tablet  3  . tadalafil (CIALIS) 20 MG tablet Take 1 tablet (20 mg total) by mouth daily as needed for erectile dysfunction.  40 tablet  11  . vardenafil (LEVITRA) 20 MG tablet Take 1  tablet (20 mg total) by mouth as needed for erectile dysfunction.  10 tablet  5   Review of Systems  Constitutional: Negative for diaphoresis and unexpected weight change.  HENT: Negative for tinnitus.   Eyes: Negative for photophobia and visual disturbance.  Respiratory: Negative for choking and stridor.   Gastrointestinal: Negative for vomiting and blood in stool.  Genitourinary: Negative for hematuria and decreased urine volume.  Musculoskeletal: Negative for gait problem.  Skin: Negative for color change and wound.  Neurological: Negative for tremors and numbness.  Psychiatric/Behavioral: Negative for decreased concentration. The patient is not hyperactive.       Objective:   Physical Exam BP 140/90  Pulse 69  Temp 97.1 F (36.2 C) (Oral)  Ht 5\' 9"  (1.753 m)  Wt 183 lb 8 oz (83.235 kg)  BMI 27.10 kg/m2  SpO2 94% Physical Exam  VS noted, not ill appearing Constitutional: Pt appears well-developed and well-nourished.  HENT: Head: Normocephalic.  Right Ear: External ear normal.  Left Ear: External ear normal.  Eyes: Conjunctivae and EOM are normal. Pupils are equal, round, and reactive to light.  Neck: Normal range of motion. Neck supple.  Cardiovascular: Normal rate and regular rhythm.   Pulmonary/Chest: Effort normal and breath sounds normal.  Abd:  Soft, NT, non-distended, + BS except for 4 cm area LLQ bruised with central subq mass grape sized, minor tender, mobile No evidence for hernia, abscess, cellulitis and no  other bruising/bleeding noted Neurological: Pt is alert. Not confused  Skin: Skin is warm. No erythema.  Psychiatric: Pt behavior is normal. Thought content normal.     Assessment & Plan:

## 2012-07-28 NOTE — Patient Instructions (Addendum)
You had the flu shot today Your left sided abdominal area of concern appears to be a large bruise with a small central hematoma just under the skin OK to use Tylenol as needed for pain, but o/w this will heal on it's own Continue all other medications as before Please have the pharmacy call with any refills you may need.

## 2012-10-28 ENCOUNTER — Ambulatory Visit: Payer: Medicare Other | Admitting: Internal Medicine

## 2013-03-19 ENCOUNTER — Emergency Department (INDEPENDENT_AMBULATORY_CARE_PROVIDER_SITE_OTHER)
Admission: EM | Admit: 2013-03-19 | Discharge: 2013-03-19 | Disposition: A | Discharge status: Home / Self Care | Payer: Medicare Other | Source: Home / Self Care

## 2013-03-19 ENCOUNTER — Emergency Department (INDEPENDENT_AMBULATORY_CARE_PROVIDER_SITE_OTHER): Payer: Medicare Other

## 2013-03-19 ENCOUNTER — Encounter (HOSPITAL_COMMUNITY): Payer: Self-pay

## 2013-03-19 DIAGNOSIS — M25469 Effusion, unspecified knee: Secondary | ICD-10-CM | POA: Diagnosis not present

## 2013-03-19 DIAGNOSIS — S7010XA Contusion of unspecified thigh, initial encounter: Secondary | ICD-10-CM

## 2013-03-19 DIAGNOSIS — M25461 Effusion, right knee: Secondary | ICD-10-CM

## 2013-03-19 DIAGNOSIS — IMO0002 Reserved for concepts with insufficient information to code with codable children: Secondary | ICD-10-CM | POA: Diagnosis not present

## 2013-03-19 DIAGNOSIS — S8990XA Unspecified injury of unspecified lower leg, initial encounter: Secondary | ICD-10-CM | POA: Diagnosis not present

## 2013-03-19 DIAGNOSIS — S99929A Unspecified injury of unspecified foot, initial encounter: Secondary | ICD-10-CM | POA: Diagnosis not present

## 2013-03-19 DIAGNOSIS — S86911A Strain of unspecified muscle(s) and tendon(s) at lower leg level, right leg, initial encounter: Secondary | ICD-10-CM

## 2013-03-19 DIAGNOSIS — S7011XA Contusion of right thigh, initial encounter: Secondary | ICD-10-CM

## 2013-03-19 DIAGNOSIS — M25569 Pain in unspecified knee: Secondary | ICD-10-CM | POA: Diagnosis not present

## 2013-03-19 IMAGING — CR DG KNEE COMPLETE 4+V*R*
4 series · 4 of 4 positions shown · non-contrast
Comparison: None.

CLINICAL DATA: Right knee pain secondary to a fall 2 weeks ago.

RIGHT KNEE - COMPLETE 4+ VIEW

[view not recorded (1 of 4)]
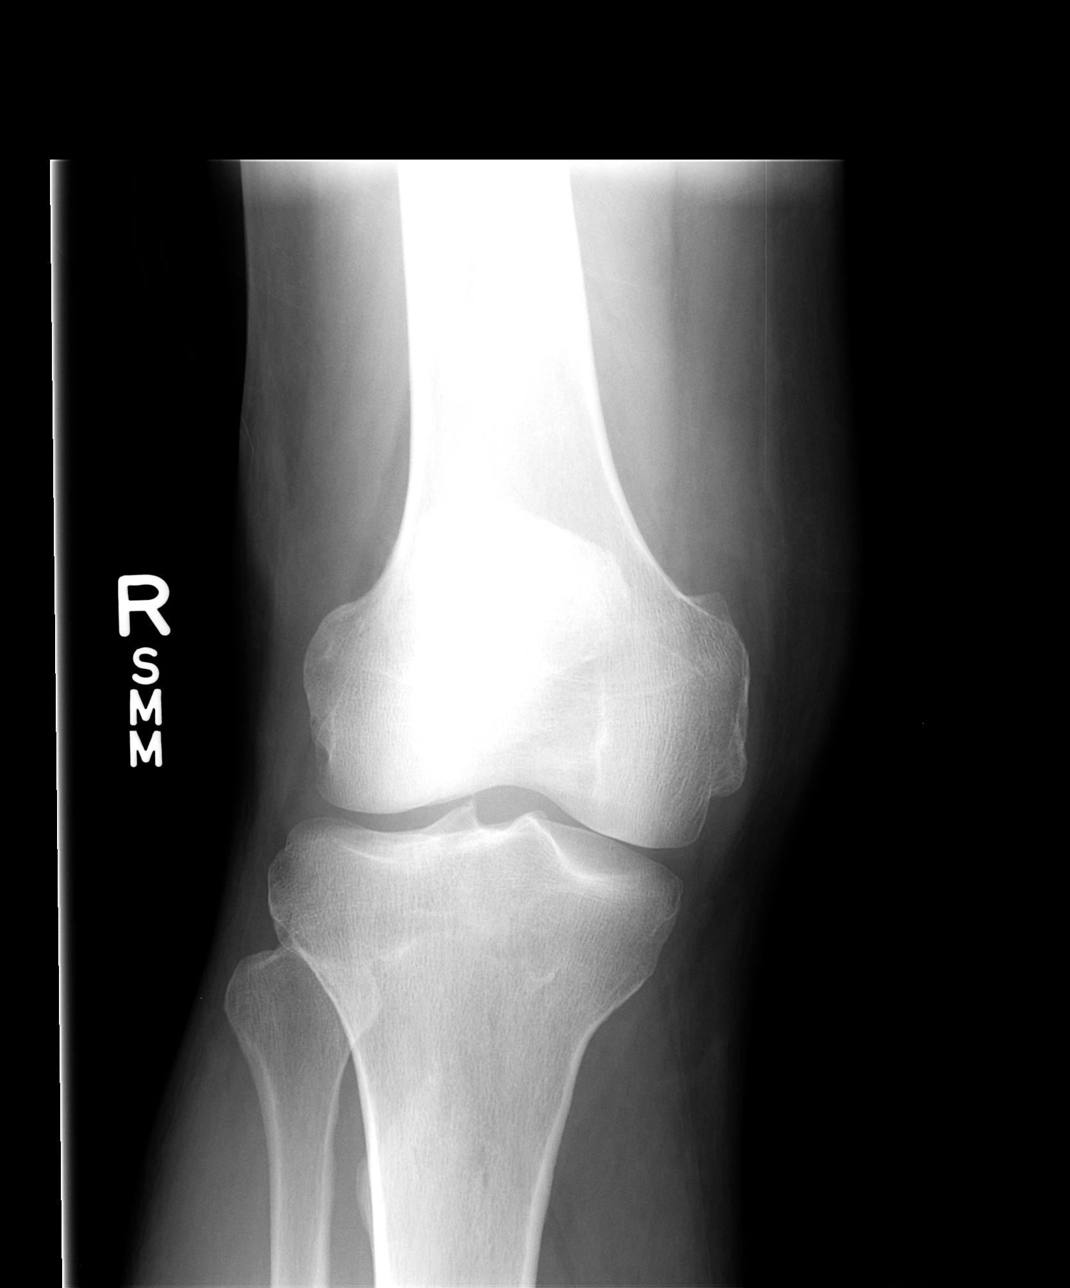

[view not recorded (2 of 4)]
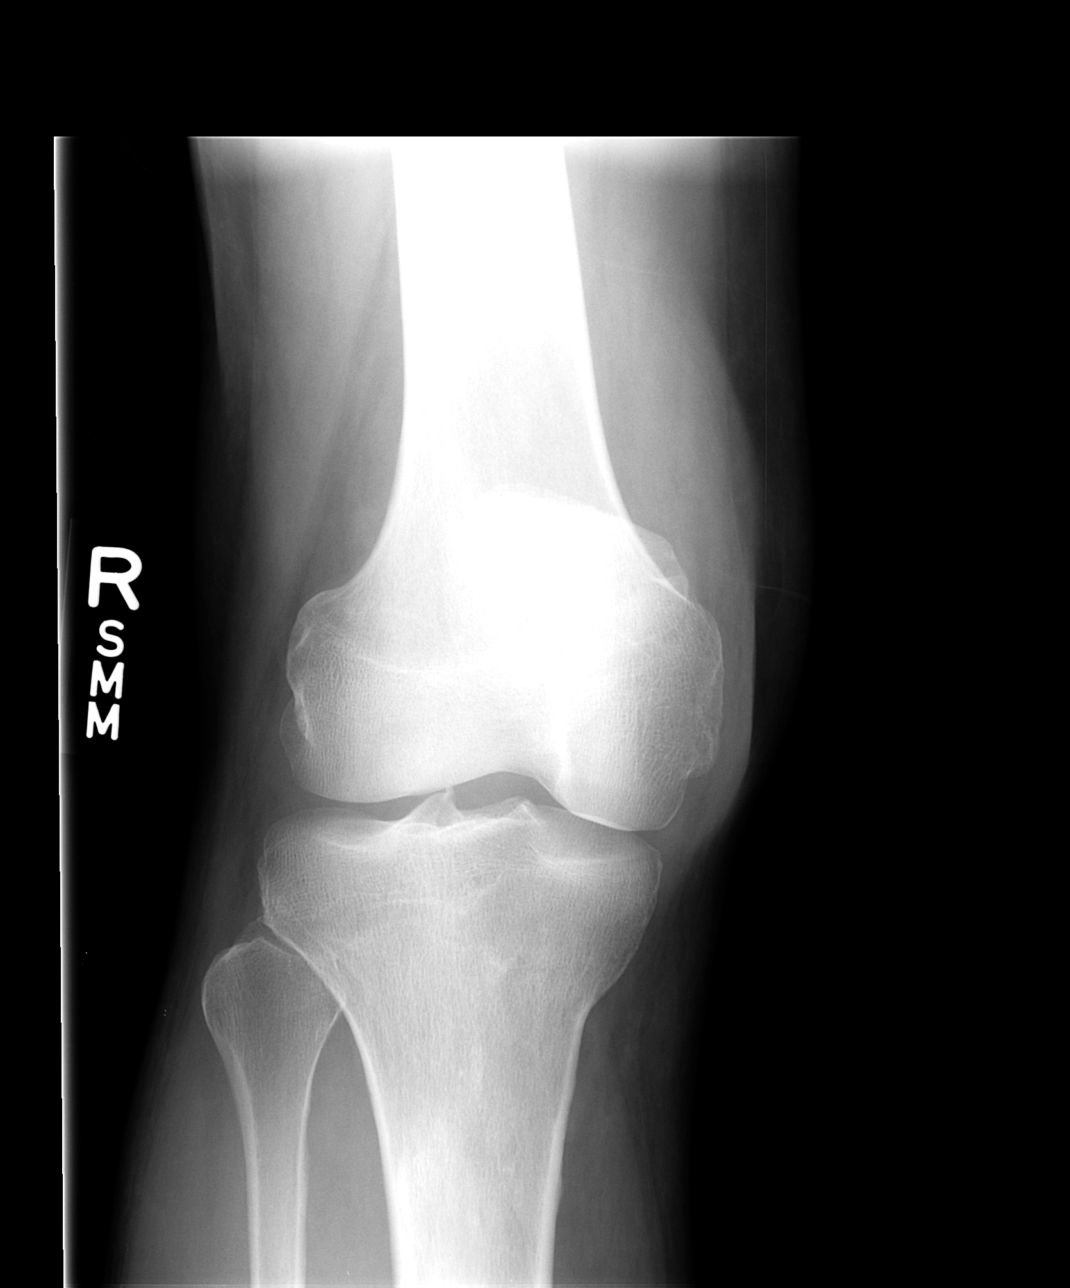

[view not recorded (3 of 4)]
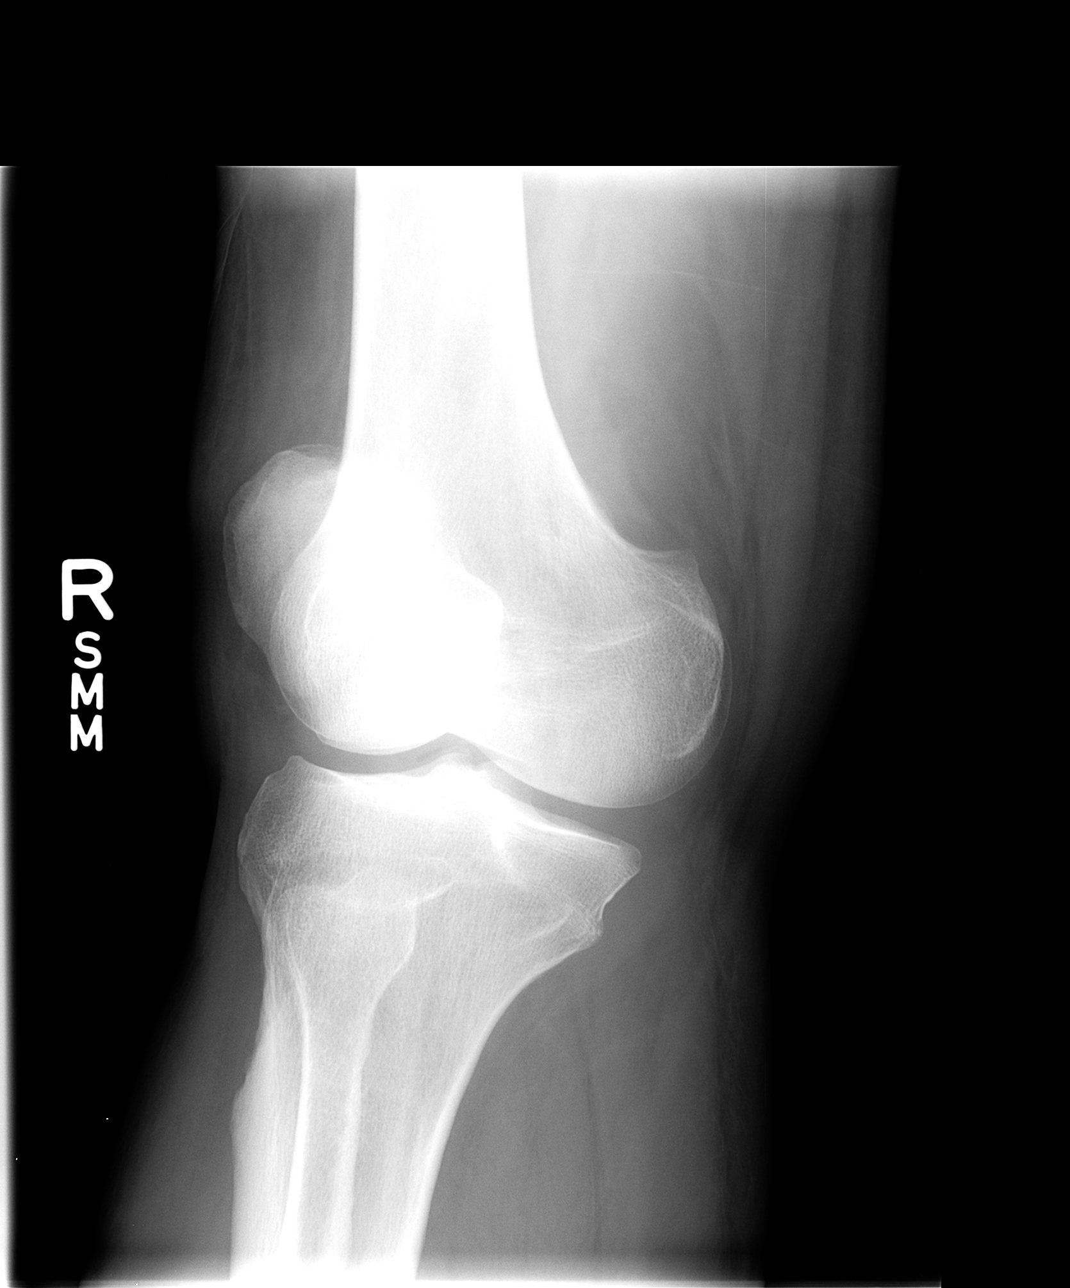

[view not recorded (4 of 4)]
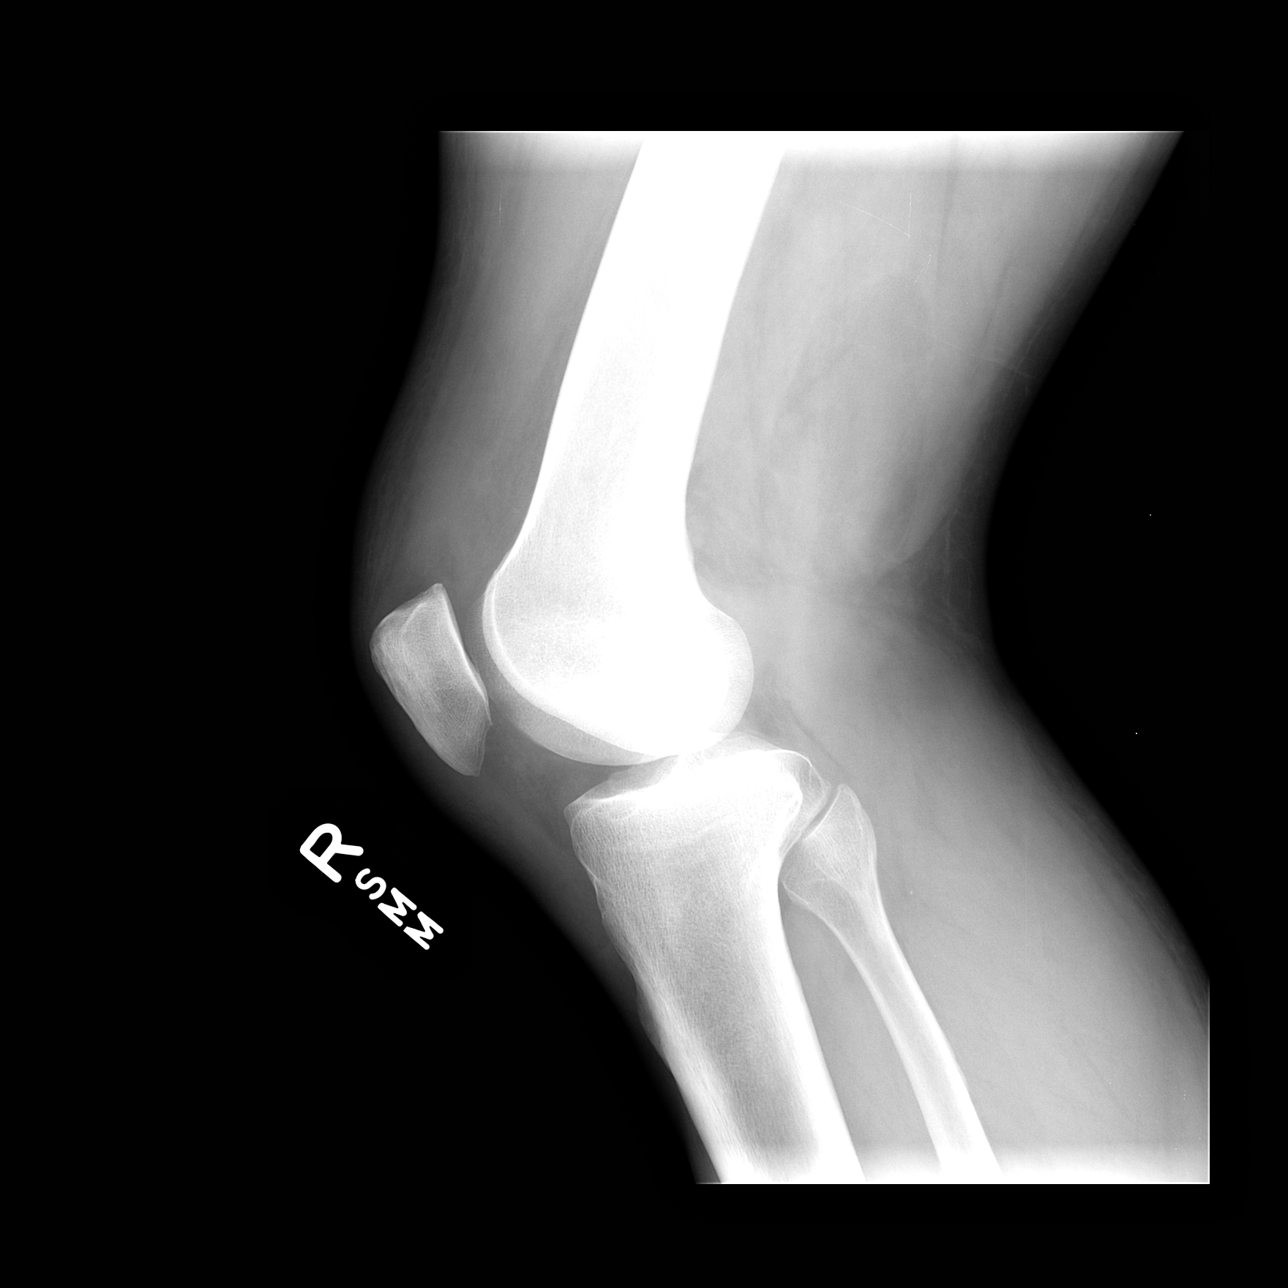

[4 of 4 positions shown; findings below may reference images not displayed]

FINDINGS: Osseous structures are normal.  There is a prominent knee
effusion.
IMPRESSION: Knee effusion.  No osseous abnormality.

## 2013-03-19 MED ORDER — DICLOFENAC POTASSIUM 50 MG PO TABS: 50.0000 mg | ORAL_TABLET | Freq: Three times a day (TID) | ORAL | Status: DC

## 2013-03-19 NOTE — ED Notes (Signed)
States he fell last week while mowing , landed on his right hip and knee, and has had pain in his right knee and hip since then. Right knee swollen , decreased ROM ; denies LOC

## 2013-03-19 NOTE — ED Provider Notes (Signed)
History    CSN: [id number] Arrival date & time 03/19/13  1005  First MD Initiated Contact with Patient 03/19/13 1047     Chief Complaint  Patient presents with  . Fall   (Consider location/radiation/quality/duration/timing/severity/associated sxs/prior Treatment) HPI Comments: 67 year old male was mowing the yard last week and fell on to his right knee and thigh. He has been walking on it ever since but experienced pain in the knee and right proximal and anterolateral thigh. He points to the quadriceps muscle as the site of pain. He has been ambulating and bearing full weight. He said the pain and swelling has increased over the past couple of days which prompted his visit today.  Past Medical History  Diagnosis Date  . ERECTILE DYSFUNCTION 11/15/2008  . FATIGUE 11/15/2008  . GERD 11/15/2008  . HYPERLIPIDEMIA 11/15/2008  . LEG PAIN, LEFT 11/05/2010  . MIGRAINE, COMMON 08/06/2010  . Nocturia 12/19/2008  . Swelling of limb 11/05/2010  . THROMBOCYTOSIS 12/19/2008    myleoproliferative disorder  . Impaired glucose tolerance 04/18/2011  . Bladder neck obstruction 04/18/2011  . Polycythemia 04/27/2012  . LVH (left ventricular hypertrophy) 04/27/2012   Past Surgical History  Procedure Laterality Date  . Appendectomy    . Tonsillectomy    . S/p esophageal dilation     Family History  Problem Relation Age of Onset  . Hypertension Mother   . Cancer Father     prostate cancer  . Hypertension Sister   . Hypertension Brother    History  Substance Use Topics  . Smoking status: Former Games developer  . Smokeless tobacco: Not on file  . Alcohol Use: Yes    Review of Systems  Constitutional: Negative.   Respiratory: Negative.   Gastrointestinal: Negative.   Genitourinary: Negative.   Musculoskeletal: Positive for joint swelling.       As per HPI  Skin: Negative.   Neurological: Negative for dizziness, weakness, numbness and headaches.    Allergies  Review of patient's allergies indicates no known  allergies.  Home Medications   Current Outpatient Rx  Name  Route  Sig  Dispense  Refill  . aspirin 81 MG tablet   Oral   Take 81 mg by mouth daily.           . diclofenac (CATAFLAM) 50 MG tablet   Oral   Take 1 tablet (50 mg total) by mouth 3 (three) times daily. Prn pain   21 tablet   0   . lovastatin (MEVACOR) 40 MG tablet   Oral   Take 1 tablet (40 mg total) by mouth daily.   90 tablet   3   . EXPIRED: tadalafil (CIALIS) 20 MG tablet   Oral   Take 1 tablet (20 mg total) by mouth daily as needed for erectile dysfunction.   40 tablet   11   . EXPIRED: vardenafil (LEVITRA) 20 MG tablet   Oral   Take 1 tablet (20 mg total) by mouth as needed for erectile dysfunction.   10 tablet   5    BP 156/84  Pulse 78  Temp(Src) 98.4 F (36.9 C) (Oral)  Resp 16  SpO2 100% Physical Exam  Nursing note and vitals reviewed. Constitutional: He is oriented to person, place, and time. He appears well-developed and well-nourished.  HENT:  Head: Normocephalic and atraumatic.  Eyes: EOM are normal. Left eye exhibits no discharge.  Neck: Normal range of motion. Neck supple.  Cardiovascular: Normal rate.   Pulmonary/Chest: Effort normal.  Musculoskeletal:  There is tenderness over the right lateral most quadricep muscle head. He appears to be a mobile hematoma just above the muscle. No external evidence of bruising, ecchymosis. No deformity. No local bony tenderness. The right knee with minor swelling primarily to the distal aspect of the tibia just above the patella. This is the area where most of the discomfort is. Palpation of the patella, tibial bony prominences medial lateral condyles and joint spaces are not tender. There is edema of the lower leg approximately 10 cm below the knee and extends into the ankle. There is no tension to the swelling. No erythema. Range of motion including extension to 180 mg. Flexion to 100. Negative drawer, negative varus valgus, no laxity  appreciated. Muscle strength is 5 over 5.  Neurological: He is alert and oriented to person, place, and time. No cranial nerve deficit.  Skin: Skin is warm and dry.  Psychiatric: He has a normal mood and affect.    ED Course  Procedures (including critical care time) Labs Reviewed - No data to display Dg Knee Complete 4 Views Right  03/19/2013   *RADIOLOGY REPORT*  Clinical Data: Right knee pain secondary to a fall 2 weeks ago.  RIGHT KNEE - COMPLETE 4+ VIEW  Comparison: None.  Findings: Osseous structures are normal.  There is a prominent knee effusion.  IMPRESSION: Knee effusion.  No osseous abnormality.   Original Report Authenticated By: Francene Boyers, M.D.   1. Knee strain, right, initial encounter   2. Knee effusion, right   3. Thigh contusion, right, initial encounter     MDM  Applied knee immobilizer and wear until he sees the orthopedist next week. Call the number above to make an appointment for next week. Keep the leg elevated and limit weightbearing. May need to use crutches. Cataflam 50 mg 3 times a day with food when necessary pain.  Hayden Rasmussen, NP 03/19/13 1205

## 2013-03-19 NOTE — ED Notes (Signed)
20  Inch  r  Knee

## 2013-03-20 NOTE — ED Provider Notes (Signed)
Medical screening examination/treatment/procedure(s) were performed by non-physician practitioner and as supervising physician I was immediately available for consultation/collaboration.   MORENO-COLL,Thomas Mabry; MD  Demetrick Eichenberger Moreno-Coll, MD 03/20/13 1525 

## 2013-04-05 ENCOUNTER — Other Ambulatory Visit: Payer: Self-pay

## 2013-04-05 MED ORDER — DICLOFENAC POTASSIUM 50 MG PO TABS: 50.0000 mg | ORAL_TABLET | Freq: Three times a day (TID) | ORAL | Status: DC

## 2013-04-05 NOTE — Telephone Encounter (Signed)
Done erx 

## 2013-04-28 ENCOUNTER — Encounter: Payer: Self-pay | Admitting: Internal Medicine

## 2013-04-28 ENCOUNTER — Ambulatory Visit (INDEPENDENT_AMBULATORY_CARE_PROVIDER_SITE_OTHER): Payer: Medicare Other

## 2013-04-28 ENCOUNTER — Telehealth: Payer: Self-pay

## 2013-04-28 ENCOUNTER — Ambulatory Visit (INDEPENDENT_AMBULATORY_CARE_PROVIDER_SITE_OTHER): Payer: Medicare Other | Admitting: Internal Medicine

## 2013-04-28 VITALS — BP 152/102 | HR 75 | Temp 97.1°F | Ht 69.0 in | Wt 183.2 lb

## 2013-04-28 DIAGNOSIS — R7309 Other abnormal glucose: Secondary | ICD-10-CM | POA: Diagnosis not present

## 2013-04-28 DIAGNOSIS — N320 Bladder-neck obstruction: Secondary | ICD-10-CM

## 2013-04-28 DIAGNOSIS — Z0000 Encounter for general adult medical examination without abnormal findings: Secondary | ICD-10-CM

## 2013-04-28 DIAGNOSIS — E785 Hyperlipidemia, unspecified: Secondary | ICD-10-CM

## 2013-04-28 DIAGNOSIS — D45 Polycythemia vera: Secondary | ICD-10-CM | POA: Diagnosis not present

## 2013-04-28 DIAGNOSIS — I10 Essential (primary) hypertension: Secondary | ICD-10-CM

## 2013-04-28 DIAGNOSIS — F528 Other sexual dysfunction not due to a substance or known physiological condition: Secondary | ICD-10-CM

## 2013-04-28 DIAGNOSIS — D751 Secondary polycythemia: Secondary | ICD-10-CM

## 2013-04-28 DIAGNOSIS — R7302 Impaired glucose tolerance (oral): Secondary | ICD-10-CM

## 2013-04-28 HISTORY — DX: Essential (primary) hypertension: I10

## 2013-04-28 LAB — LIPID PANEL
Cholesterol: 191 mg/dL (ref 0–200)
Total CHOL/HDL Ratio: 3
Triglycerides: 271 mg/dL — ABNORMAL HIGH (ref 0.0–149.0)

## 2013-04-28 LAB — CBC WITH DIFFERENTIAL/PLATELET
Basophils Absolute: 0 10*3/uL (ref 0.0–0.1)
Basophils Relative: 0.2 % (ref 0.0–3.0)
Eosinophils Absolute: 0.5 10*3/uL (ref 0.0–0.7)
Hemoglobin: 18 g/dL (ref 13.0–17.0)
MCHC: 31.1 g/dL (ref 30.0–36.0)
MCV: 87.2 fl (ref 78.0–100.0)
Monocytes Absolute: 0.5 10*3/uL (ref 0.1–1.0)
Neutro Abs: 10.4 10*3/uL — ABNORMAL HIGH (ref 1.4–7.7)
Neutrophils Relative %: 81 % — ABNORMAL HIGH (ref 43.0–77.0)
RBC: 6.64 Mil/uL — ABNORMAL HIGH (ref 4.22–5.81)
RDW: 17.5 % — ABNORMAL HIGH (ref 11.5–14.6)

## 2013-04-28 LAB — BASIC METABOLIC PANEL
CO2: 30 mEq/L (ref 19–32)
Calcium: 10.4 mg/dL (ref 8.4–10.5)
Chloride: 102 mEq/L (ref 96–112)
Creatinine, Ser: 1.2 mg/dL (ref 0.4–1.5)
Glucose, Bld: 82 mg/dL (ref 70–99)

## 2013-04-28 LAB — URINALYSIS, ROUTINE W REFLEX MICROSCOPIC
Leukocytes, UA: NEGATIVE
Nitrite: NEGATIVE
Specific Gravity, Urine: 1.02 (ref 1.000–1.030)
Urobilinogen, UA: 0.2 (ref 0.0–1.0)

## 2013-04-28 LAB — HEPATIC FUNCTION PANEL
Albumin: 4.5 g/dL (ref 3.5–5.2)
Total Protein: 7.3 g/dL (ref 6.0–8.3)

## 2013-04-28 LAB — HEMOGLOBIN A1C: Hgb A1c MFr Bld: 5.6 % (ref 4.6–6.5)

## 2013-04-28 MED ORDER — TADALAFIL 20 MG PO TABS: 20.0000 mg | ORAL_TABLET | Freq: Every day | ORAL | Status: DC | PRN

## 2013-04-28 MED ORDER — VARDENAFIL HCL 20 MG PO TABS: 20.0000 mg | ORAL_TABLET | ORAL | Status: DC | PRN

## 2013-04-28 MED ORDER — AMLODIPINE BESYLATE 5 MG PO TABS: 5.0000 mg | ORAL_TABLET | Freq: Every day | ORAL | Status: DC

## 2013-04-28 NOTE — Assessment & Plan Note (Signed)
Lab Results  Component Value Date   HGBA1C 5.6 04/27/2012   stable overall by history and exam, recent data reviewed with pt, and pt to continue medical treatment as before,  to f/u any worsening symptoms or concerns

## 2013-04-28 NOTE — Progress Notes (Signed)
Subjective:    Patient ID: Dillon [name], male    DOB: 04-28-1946, 67 y.o.   MRN: [id number]  HPI  Here for yearly f/u;  Overall doing ok;  Pt denies CP, worsening SOB, DOE, wheezing, orthopnea, PND, worsening LE edema, palpitations, dizziness or syncope.  Pt denies neurological change such as new headache, facial or extremity weakness.  Pt denies polydipsia, polyuria, or low sugar symptoms. Pt states overall good compliance with treatment and medications, good tolerability, and has been trying to follow lower cholesterol diet.  Pt denies worsening depressive symptoms, suicidal ideation or panic. No fever, night sweats, wt loss, loss of appetite, or other constitutional symptoms.  Pt states good ability with ADL's, has low fall risk, home safety reviewed and adequate, no other significant changes in hearing or vision, and only occasionally active with exercise. Would like rx to try to compare to cialis in efficacy.  No other new complaints.  Still working full time in Set designer Past Medical History  Diagnosis Date  . ERECTILE DYSFUNCTION 11/15/2008  . FATIGUE 11/15/2008  . GERD 11/15/2008  . HYPERLIPIDEMIA 11/15/2008  . LEG PAIN, LEFT 11/05/2010  . MIGRAINE, COMMON 08/06/2010  . Nocturia 12/19/2008  . Swelling of limb 11/05/2010  . THROMBOCYTOSIS 12/19/2008    myleoproliferative disorder  . Impaired glucose tolerance 04/18/2011  . Bladder neck obstruction 04/18/2011  . Polycythemia 04/27/2012  . LVH (left ventricular hypertrophy) 04/27/2012   Past Surgical History  Procedure Laterality Date  . Appendectomy    . Tonsillectomy    . S/p esophageal dilation      reports that he has quit smoking. He does not have any smokeless tobacco history on file. He reports that  drinks alcohol. He reports that he does not use illicit drugs. family history includes Cancer in his father; Hypertension in his brother, mother, and sister. No Known Allergies Current Outpatient Prescriptions on File Prior to Visit   Medication Sig Dispense Refill  . aspirin 81 MG tablet Take 81 mg by mouth daily.        Marland Kitchen lovastatin (MEVACOR) 40 MG tablet Take 1 tablet (40 mg total) by mouth daily.  90 tablet  3  . tadalafil (CIALIS) 20 MG tablet Take 1 tablet (20 mg total) by mouth daily as needed for erectile dysfunction.  40 tablet  11  . vardenafil (LEVITRA) 20 MG tablet Take 1 tablet (20 mg total) by mouth as needed for erectile dysfunction.  10 tablet  5   No current facility-administered medications on file prior to visit.   Right knee back to normal about 95%.  Review of Systems  Constitutional: Negative for unexpected weight change, or unusual diaphoresis  HENT: Negative for tinnitus.   Eyes: Negative for photophobia and visual disturbance.  Respiratory: Negative for choking and stridor.   Gastrointestinal: Negative for vomiting and blood in stool.  Genitourinary: Negative for hematuria and decreased urine volume.  Musculoskeletal: Negative for acute joint swelling Skin: Negative for color change and wound.  Neurological: Negative for tremors and numbness other than noted  Psychiatric/Behavioral: Negative for decreased concentration or  hyperactivity.       Objective:   Physical Exam BP 152/102  Pulse 75  Temp(Src) 97.1 F (36.2 C) (Oral)  Ht 5\' 9"  (1.753 m)  Wt 183 lb 4 oz (83.122 kg)  BMI 27.05 kg/m2  SpO2 96% VS noted,  Constitutional: Pt is oriented to person, place, and time. Appears well-developed and well-nourished.  Head: Normocephalic and atraumatic.  Right Ear: External  ear normal.  Left Ear: External ear normal.  Nose: Nose normal.  Mouth/Throat: Oropharynx is clear and moist.  Eyes: Conjunctivae and EOM are normal. Pupils are equal, round, and reactive to light.  Neck: Normal range of motion. Neck supple. No JVD present. No tracheal deviation present.  Cardiovascular: Normal rate, regular rhythm, normal heart sounds and intact distal pulses.   Pulmonary/Chest: Effort normal and  breath sounds normal.  Abdominal: Soft. Bowel sounds are normal. There is no tenderness. No HSM  Musculoskeletal: Normal range of motion. Exhibits no edema.  Lymphadenopathy:  Has no cervical adenopathy.  Neurological: Pt is alert and oriented to person, place, and time. Pt has normal reflexes. No cranial nerve deficit.  Skin: Skin is warm and dry. No rash noted.  Psychiatric:  Has  normal mood and affect. Behavior is normal.     Assessment & Plan:

## 2013-04-28 NOTE — Telephone Encounter (Signed)
Critical lab result hemoblogin 18

## 2013-04-28 NOTE — Patient Instructions (Signed)
Please take all new medication as prescribed - the amlodipine 5 mg per day (for blood pressure) Please continue all other medications as before, and refills have been done if requested, including the levitra and the cialis Please take the Aspirin 81 mg (coated only) as you do Please continue your efforts at being more active, low cholesterol diet, and weight control. You are otherwise up to date with prevention measures today. Please keep your appointments with your specialists as you may have planned Please go to the LAB in the Basement (turn left off the elevator) for the tests to be done today You will be contacted by phone if any changes need to be made immediately.  Otherwise, you will receive a letter about your results with an explanation, but please check with MyChart first.  Please remember to sign up for My Chart if you have not done so, as this will be important to you in the future with finding out test results, communicating by private email, and scheduling acute appointments online when needed.  Please return in 3 months, or sooner if needed

## 2013-04-28 NOTE — Assessment & Plan Note (Addendum)
stable overall by history and exam, recent data reviewed with pt, and pt to continue medical treatment as before,  to f/u any worsening symptoms or concerns  Lab Results  Component Value Date   LDLCALC 103* 04/27/2012   Note:  Total time for pt hx, exam, review of record with pt in the room, determination of diagnoses and plan for further eval and tx is > 40 min, with over 50% spent in coordination and counseling of patient

## 2013-04-28 NOTE — Assessment & Plan Note (Signed)
For f/u cbc today, improved as of last yr Lab Results  Component Value Date   WBC 10.7* 04/27/2012   HGB 16.9 04/27/2012   HCT 53.7* 04/27/2012   MCV 92.7 04/27/2012   PLT 688.0* 04/27/2012

## 2013-04-28 NOTE — Assessment & Plan Note (Signed)
stable overall by history and exam, recent data reviewed with pt, and pt to continue medical treatment as before but now willing to start med tx as well - to start amlod 5 qd,  to f/u any worsening symptoms or concerns BP Readings from Last 3 Encounters:  04/28/13 152/102  03/19/13 156/84  07/28/12 140/90

## 2013-04-28 NOTE — Assessment & Plan Note (Signed)
Ok for levitra/cialis prn

## 2013-04-28 NOTE — Assessment & Plan Note (Signed)
Also for psa as he is due 

## 2013-04-29 ENCOUNTER — Other Ambulatory Visit: Payer: Self-pay | Admitting: Internal Medicine

## 2013-04-29 ENCOUNTER — Encounter: Payer: Self-pay | Admitting: Internal Medicine

## 2013-04-29 DIAGNOSIS — D473 Essential (hemorrhagic) thrombocythemia: Secondary | ICD-10-CM

## 2013-04-29 DIAGNOSIS — D751 Secondary polycythemia: Secondary | ICD-10-CM

## 2013-06-17 DIAGNOSIS — Z0279 Encounter for issue of other medical certificate: Secondary | ICD-10-CM

## 2013-07-29 ENCOUNTER — Encounter: Payer: Self-pay | Admitting: Internal Medicine

## 2013-07-29 ENCOUNTER — Ambulatory Visit (INDEPENDENT_AMBULATORY_CARE_PROVIDER_SITE_OTHER): Payer: PRIVATE HEALTH INSURANCE | Admitting: Internal Medicine

## 2013-07-29 VITALS — BP 152/100 | HR 92 | Temp 99.0°F | Ht 69.0 in | Wt 184.5 lb

## 2013-07-29 DIAGNOSIS — D45 Polycythemia vera: Secondary | ICD-10-CM | POA: Diagnosis not present

## 2013-07-29 DIAGNOSIS — I10 Essential (primary) hypertension: Secondary | ICD-10-CM | POA: Diagnosis not present

## 2013-07-29 DIAGNOSIS — Z23 Encounter for immunization: Secondary | ICD-10-CM | POA: Diagnosis not present

## 2013-07-29 DIAGNOSIS — H109 Unspecified conjunctivitis: Secondary | ICD-10-CM | POA: Diagnosis not present

## 2013-07-29 DIAGNOSIS — D751 Secondary polycythemia: Secondary | ICD-10-CM

## 2013-07-29 MED ORDER — AMLODIPINE BESYLATE 5 MG PO TABS: 5.0000 mg | ORAL_TABLET | Freq: Every day | ORAL | Status: DC

## 2013-07-29 NOTE — Progress Notes (Signed)
Subjective:    Patient ID: Dillon [name], male    DOB: [birth year], 67 y.o.   MRN: [id number]  HPI  Here to f/u; overall doing ok,  Pt denies chest pain, increased sob or doe, wheezing, orthopnea, PND, increased LE swelling, palpitations, dizziness or syncope.  Pt denies polydipsia, polyuria, or low sugar symptoms such as weakness or confusion improved with po intake.  Pt denies new neurological symptoms such as new headache, or facial or extremity weakness or numbness.   Pt states overall good compliance with meds, has been trying to follow lower cholesterol diet, with wt overall stable,  Trying to stay somewhat active.  Was somewhat confused last visit, did not start the amlod 5,  But willing to do so. Has not been able to see heme as well for the elev hgb/plt/wbc as well, reason not clear.  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  Now with eye bilat conjunct itching and watery d/c, no fever, pain, sinus, ear or ST symptoms Past Medical History  Diagnosis Date  . ERECTILE DYSFUNCTION 11/15/2008  . FATIGUE 11/15/2008  . GERD 11/15/2008  . HYPERLIPIDEMIA 11/15/2008  . LEG PAIN, LEFT 11/05/2010  . MIGRAINE, COMMON 08/06/2010  . Nocturia 12/19/2008  . Swelling of limb 11/05/2010  . THROMBOCYTOSIS 12/19/2008    myleoproliferative disorder  . Impaired glucose tolerance 04/18/2011  . Bladder neck obstruction 04/18/2011  . Polycythemia 04/27/2012  . LVH (left ventricular hypertrophy) 04/27/2012  . Essential hypertension, benign 04/28/2013   Past Surgical History  Procedure Laterality Date  . Appendectomy    . Tonsillectomy    . S/p esophageal dilation      reports that he has quit smoking. He does not have any smokeless tobacco history on file. He reports that he drinks alcohol. He reports that he does not use illicit drugs. family history includes Cancer in his father; Hypertension in his brother, mother, and sister. No Known Allergies Current Outpatient Prescriptions on File Prior to  Visit  Medication Sig Dispense Refill  . amLODipine (NORVASC) 5 MG tablet Take 1 tablet (5 mg total) by mouth daily.  90 tablet  3  . aspirin 81 MG tablet Take 81 mg by mouth daily.        Marland Kitchen lovastatin (MEVACOR) 40 MG tablet Take 1 tablet (40 mg total) by mouth daily.  90 tablet  3  . vardenafil (LEVITRA) 20 MG tablet Take 1 tablet (20 mg total) by mouth as needed for erectile dysfunction.  40 tablet  5  . tadalafil (CIALIS) 20 MG tablet Take 1 tablet (20 mg total) by mouth daily as needed for erectile dysfunction.  40 tablet  11   No current facility-administered medications on file prior to visit.     Review of Systems  Constitutional: Negative for unexpected weight change, or unusual diaphoresis  HENT: Negative for tinnitus.   Eyes: Negative for photophobia and visual disturbance.  Respiratory: Negative for choking and stridor.   Gastrointestinal: Negative for vomiting and blood in stool.  Genitourinary: Negative for hematuria and decreased urine volume.  Musculoskeletal: Negative for acute joint swelling Skin: Negative for color change and wound.  Neurological: Negative for tremors and numbness other than noted  Psychiatric/Behavioral: Negative for decreased concentration or  hyperactivity.       Objective:   Physical Exam BP 152/100  Pulse 92  Temp(Src) 99 F (37.2 C) (Oral)  Ht 5\' 9"  (1.753 m)  Wt 184 lb 8 oz (83.689 kg)  BMI 27.23  kg/m2  SpO2 98% VS noted, not ill appearing Constitutional: Pt appears well-developed and well-nourished.  HENT: Head: NCAT.  Right Ear: External ear normal.  Left Ear: External ear normal.  Eyes: Conjunctivae with mild injection bilat and watery d/c,and EOM are normal. Pupils are equal, round, and reactive to light.  Neck: Normal range of motion. Neck supple.  Cardiovascular: Normal rate and regular rhythm.   Pulmonary/Chest: Effort normal and breath sounds normal.  Abd:  Soft, NT, non-distended, + BS Neurological: Pt is alert. Not  confused  Skin: Skin is warm. No erythema.  Psychiatric: Pt behavior is normal. Thought content normal.     Assessment & Plan:

## 2013-07-29 NOTE — Addendum Note (Signed)
Addended by: Scharlene Gloss B on: 07/29/2013 10:18 AM   Modules accepted: Orders

## 2013-07-29 NOTE — Assessment & Plan Note (Signed)
Suspect allergic, though cant r/o viral, for zaditor otc prn, pt to call if worse for antibx if needed

## 2013-07-29 NOTE — Patient Instructions (Addendum)
You had the new Prevnar Pneumonia shot today Please try the OTC Zaditor eye drops for allergies If the eyes become worse with more discharge, pain, or fever, please call for antibiotic  Please take all new medication as prescribed - the amlodipine 5 mg per day (for blood pressure)  You will be contacted regarding the referral for: Hematology for the blood count elevations  Please return in 3 months, or sooner if needed

## 2013-07-29 NOTE — Assessment & Plan Note (Signed)
Was confused for some reason, not taking the amlod 5 - will try to start, f/u BP at home and next visit

## 2013-07-29 NOTE — Assessment & Plan Note (Signed)
Will ask to refer again to heme,  to f/u any worsening symptoms or concerns

## 2013-07-29 NOTE — Progress Notes (Signed)
Pre-visit discussion using our clinic review tool. No additional management support is needed unless otherwise documented below in the visit note.  

## 2013-08-04 ENCOUNTER — Telehealth: Payer: Self-pay | Admitting: Oncology

## 2013-08-04 NOTE — Telephone Encounter (Signed)
lvom for pt to return call in re to appt.

## 2013-09-17 ENCOUNTER — Emergency Department (INDEPENDENT_AMBULATORY_CARE_PROVIDER_SITE_OTHER)
Admission: EM | Admit: 2013-09-17 | Discharge: 2013-09-17 | Disposition: A | Discharge status: Home / Self Care | Payer: PRIVATE HEALTH INSURANCE | Source: Home / Self Care | Attending: Family Medicine | Admitting: Family Medicine

## 2013-09-17 ENCOUNTER — Encounter (HOSPITAL_COMMUNITY): Payer: Self-pay | Admitting: Emergency Medicine

## 2013-09-17 DIAGNOSIS — R69 Illness, unspecified: Principal | ICD-10-CM

## 2013-09-17 DIAGNOSIS — J111 Influenza due to unidentified influenza virus with other respiratory manifestations: Secondary | ICD-10-CM | POA: Diagnosis not present

## 2013-09-17 MED ORDER — HYDROCOD POLST-CHLORPHEN POLST 10-8 MG/5ML PO LQCR: 5.0000 mL | Freq: Two times a day (BID) | ORAL | Status: DC | PRN

## 2013-09-17 NOTE — Discharge Instructions (Signed)
Drink plenty of fluids as discussed, use medicine as needed or mucinex or delsym for cough. Return or see your doctor if further problems

## 2013-09-17 NOTE — ED Provider Notes (Signed)
CSN: [id number]     Arrival date & time 09/17/13  1657 History   First MD Initiated Contact with Patient 09/17/13 1836     No chief complaint on file.  (Consider location/radiation/quality/duration/timing/severity/associated sxs/prior Treatment) Patient is a 68 y.o. male presenting with URI.  URI Presenting symptoms: congestion, cough, fatigue, fever and rhinorrhea   Severity:  Moderate Onset quality:  Sudden Progression:  Improving Chronicity:  New Ineffective treatments:  OTC medications Associated symptoms: myalgias   Risk factors: being elderly     Past Medical History  Diagnosis Date  . ERECTILE DYSFUNCTION 11/15/2008  . FATIGUE 11/15/2008  . GERD 11/15/2008  . HYPERLIPIDEMIA 11/15/2008  . LEG PAIN, LEFT 11/05/2010  . MIGRAINE, COMMON 08/06/2010  . Nocturia 12/19/2008  . Swelling of limb 11/05/2010  . THROMBOCYTOSIS 12/19/2008    myleoproliferative disorder  . Impaired glucose tolerance 04/18/2011  . Bladder neck obstruction 04/18/2011  . Polycythemia 04/27/2012  . LVH (left ventricular hypertrophy) 04/27/2012  . Essential hypertension, benign 04/28/2013   Past Surgical History  Procedure Laterality Date  . Appendectomy    . Tonsillectomy    . S/p esophageal dilation     Family History  Problem Relation Age of Onset  . Hypertension Mother   . Cancer Father     prostate cancer  . Hypertension Sister   . Hypertension Brother    History  Substance Use Topics  . Smoking status: Former Research scientist (life sciences)  . Smokeless tobacco: Not on file  . Alcohol Use: Yes    Review of Systems  Constitutional: Positive for fever and fatigue.  HENT: Positive for congestion and rhinorrhea.   Respiratory: Positive for cough.   Cardiovascular: Negative.   Gastrointestinal: Negative.   Musculoskeletal: Positive for myalgias.    Allergies  Review of patient's allergies indicates no known allergies.  Home Medications   Current Outpatient Rx  Name  Route  Sig  Dispense  Refill  . amLODipine (NORVASC)  5 MG tablet   Oral   Take 1 tablet (5 mg total) by mouth daily.   90 tablet   3   . aspirin 81 MG tablet   Oral   Take 81 mg by mouth daily.           . chlorpheniramine-HYDROcodone (TUSSIONEX PENNKINETIC ER) 10-8 MG/5ML LQCR   Oral   Take 5 mLs by mouth every 12 (twelve) hours as needed for cough.   115 mL   0   . lovastatin (MEVACOR) 40 MG tablet   Oral   Take 1 tablet (40 mg total) by mouth daily.   90 tablet   3   . EXPIRED: tadalafil (CIALIS) 20 MG tablet   Oral   Take 1 tablet (20 mg total) by mouth daily as needed for erectile dysfunction.   40 tablet   11   . vardenafil (LEVITRA) 20 MG tablet   Oral   Take 1 tablet (20 mg total) by mouth as needed for erectile dysfunction.   40 tablet   5    BP 148/90  Pulse 85  Temp(Src) 98.9 F (37.2 C) (Oral)  Resp 16  SpO2 98% Physical Exam  Nursing note and vitals reviewed. Constitutional: He is oriented to person, place, and time. He appears well-developed and well-nourished.  HENT:  Head: Normocephalic.  Right Ear: External ear normal.  Left Ear: External ear normal.  Mouth/Throat: Oropharynx is clear and moist.  Eyes: Conjunctivae are normal. Pupils are equal, round, and reactive to light.  Neck: Normal range  of motion. Neck supple.  Cardiovascular: Normal rate, regular rhythm, normal heart sounds and intact distal pulses.   Pulmonary/Chest: Effort normal and breath sounds normal. He has no wheezes. He has no rales.  Lymphadenopathy:    He has no cervical adenopathy.  Neurological: He is alert and oriented to person, place, and time.  Skin: Skin is warm and dry.    ED Course  Procedures (including critical care time) Labs Review Labs Reviewed - No data to display Imaging Review No results found.  EKG Interpretation    Date/Time:    Ventricular Rate:    PR Interval:    QRS Duration:   QT Interval:    QTC Calculation:   R Axis:     Text Interpretation:              MDM       Billy Fischer, MD 09/17/13 [phone]

## 2013-09-17 NOTE — ED Notes (Signed)
C/o chest congestion onset Sun.  Fever onset Mon.  No aching but back feels tire.  Cough is occ. prod. Clear sputum.

## 2013-10-29 ENCOUNTER — Encounter: Payer: Self-pay | Admitting: Internal Medicine

## 2013-10-29 ENCOUNTER — Ambulatory Visit (INDEPENDENT_AMBULATORY_CARE_PROVIDER_SITE_OTHER): Payer: PRIVATE HEALTH INSURANCE

## 2013-10-29 ENCOUNTER — Ambulatory Visit (INDEPENDENT_AMBULATORY_CARE_PROVIDER_SITE_OTHER): Payer: PRIVATE HEALTH INSURANCE | Admitting: Internal Medicine

## 2013-10-29 VITALS — BP 140/80 | HR 82 | Temp 98.3°F | Ht 69.0 in | Wt 186.4 lb

## 2013-10-29 DIAGNOSIS — R351 Nocturia: Secondary | ICD-10-CM

## 2013-10-29 DIAGNOSIS — I10 Essential (primary) hypertension: Secondary | ICD-10-CM

## 2013-10-29 DIAGNOSIS — D751 Secondary polycythemia: Secondary | ICD-10-CM

## 2013-10-29 DIAGNOSIS — K089 Disorder of teeth and supporting structures, unspecified: Secondary | ICD-10-CM

## 2013-10-29 DIAGNOSIS — D45 Polycythemia vera: Secondary | ICD-10-CM

## 2013-10-29 DIAGNOSIS — K0889 Other specified disorders of teeth and supporting structures: Secondary | ICD-10-CM

## 2013-10-29 DIAGNOSIS — D759 Disease of blood and blood-forming organs, unspecified: Secondary | ICD-10-CM

## 2013-10-29 LAB — CBC WITH DIFFERENTIAL/PLATELET
Basophils Absolute: 0 10*3/uL (ref 0.0–0.1)
Basophils Relative: 0.3 % (ref 0.0–3.0)
EOS ABS: 0.4 10*3/uL (ref 0.0–0.7)
Eosinophils Relative: 2.5 % (ref 0.0–5.0)
HCT: 61 % (ref 39.0–52.0)
Lymphocytes Relative: 15.3 % (ref 12.0–46.0)
Lymphs Abs: 2.2 10*3/uL (ref 0.7–4.0)
MCHC: 30.5 g/dL (ref 30.0–36.0)
MCV: 84.7 fl (ref 78.0–100.0)
MONO ABS: 0.6 10*3/uL (ref 0.1–1.0)
Monocytes Relative: 4.5 % (ref 3.0–12.0)
NEUTROS ABS: 11.1 10*3/uL — AB (ref 1.4–7.7)
Neutrophils Relative %: 77.4 % — ABNORMAL HIGH (ref 43.0–77.0)
Platelets: 977 10*3/uL — ABNORMAL HIGH (ref 150.0–400.0)
RBC: 7.2 Mil/uL — AB (ref 4.22–5.81)
RDW: 20.6 % — ABNORMAL HIGH (ref 11.5–14.6)
WBC: 14.3 10*3/uL — ABNORMAL HIGH (ref 4.5–10.5)

## 2013-10-29 LAB — URINALYSIS, ROUTINE W REFLEX MICROSCOPIC
Bilirubin Urine: NEGATIVE
HGB URINE DIPSTICK: NEGATIVE
KETONES UR: NEGATIVE
Leukocytes, UA: NEGATIVE
Nitrite: NEGATIVE
RBC / HPF: NONE SEEN (ref 0–?)
SPECIFIC GRAVITY, URINE: 1.02 (ref 1.000–1.030)
Total Protein, Urine: NEGATIVE
UROBILINOGEN UA: 0.2 (ref 0.0–1.0)
Urine Glucose: NEGATIVE
pH: 6.5 (ref 5.0–8.0)

## 2013-10-29 LAB — LIPID PANEL
CHOL/HDL RATIO: 3
Cholesterol: 185 mg/dL (ref 0–200)
HDL: 56.2 mg/dL (ref >39.00)
Triglycerides: 214 mg/dL — ABNORMAL HIGH (ref 0.0–149.0)
VLDL: 42.8 mg/dL — ABNORMAL HIGH (ref 0.0–40.0)

## 2013-10-29 LAB — BASIC METABOLIC PANEL
BUN: 9 mg/dL (ref 6–23)
CHLORIDE: 105 meq/L (ref 96–112)
CO2: 25 meq/L (ref 19–32)
Calcium: 9.8 mg/dL (ref 8.4–10.5)
Creatinine, Ser: 1.1 mg/dL (ref 0.4–1.5)
GFR: 89.79 mL/min (ref >60.00)
GLUCOSE: 96 mg/dL (ref 70–99)
Potassium: 4.1 mEq/L (ref 3.5–5.1)
SODIUM: 140 meq/L (ref 135–145)

## 2013-10-29 MED ORDER — AMLODIPINE BESYLATE 5 MG PO TABS: 5.0000 mg | ORAL_TABLET | Freq: Every day | ORAL | Status: DC

## 2013-10-29 MED ORDER — LOVASTATIN 40 MG PO TABS: 40.0000 mg | ORAL_TABLET | Freq: Every day | ORAL | Status: DC

## 2013-10-29 MED ORDER — TAMSULOSIN HCL 0.4 MG PO CAPS: 0.4000 mg | ORAL_CAPSULE | Freq: Every day | ORAL | Status: DC

## 2013-10-29 NOTE — Assessment & Plan Note (Signed)
Asympt, for /u lab, refer hematology

## 2013-10-29 NOTE — Assessment & Plan Note (Signed)
Last hgb 18, asympt, also refer heme

## 2013-10-29 NOTE — Assessment & Plan Note (Signed)
stable overall by history and exam, recent data reviewed with pt, and pt to continue medical treatment as before,  to f/u any worsening symptoms or concerns BP Readings from Last 3 Encounters:  10/29/13 140/80  09/17/13 148/90  07/29/13 152/100

## 2013-10-29 NOTE — Progress Notes (Signed)
Subjective:    Patient ID: Dillon [name], male    DOB: April 13, 1946, 68 y.o.   MRN: [id number]  HPI  Here to f/u; overall doing ok,  Pt denies chest pain, increased sob or doe, wheezing, orthopnea, PND, increased LE swelling, palpitations, dizziness or syncope.  Pt denies polydipsia, polyuria, or low sugar symptoms such as weakness or confusion improved with po intake.  Pt denies new neurological symptoms such as new headache, or facial or extremity weakness or numbness.   Pt states overall good compliance with meds, has been trying to follow lower cholesterol diet.  Has nocturia 3 times nightly with repeated small volume urination, hard to completely void.  States missed appt before with hematology, needs re-referral. Alsohas a loose tooth right upper molar needs pulled, no fever or drainage or swelling Past Medical History  Diagnosis Date  . ERECTILE DYSFUNCTION 11/15/2008  . FATIGUE 11/15/2008  . GERD 11/15/2008  . HYPERLIPIDEMIA 11/15/2008  . LEG PAIN, LEFT 11/05/2010  . MIGRAINE, COMMON 08/06/2010  . Nocturia 12/19/2008  . Swelling of limb 11/05/2010  . THROMBOCYTOSIS 12/19/2008    myleoproliferative disorder  . Impaired glucose tolerance 04/18/2011  . Bladder neck obstruction 04/18/2011  . Polycythemia 04/27/2012  . LVH (left ventricular hypertrophy) 04/27/2012  . Essential hypertension, benign 04/28/2013   Past Surgical History  Procedure Laterality Date  . Appendectomy    . Tonsillectomy    . S/p esophageal dilation      reports that he quit smoking about 43 years ago. His smoking use included Cigarettes. He smoked 0.00 packs per day. He does not have any smokeless tobacco history on file. He reports that he drinks alcohol. He reports that he does not use illicit drugs. family history includes Cancer in his father; Hypertension in his brother, mother, and sister. No Known Allergies Current Outpatient Prescriptions on File Prior to Visit  Medication Sig Dispense Refill  . aspirin 81 MG  tablet Take 81 mg by mouth daily.        . vardenafil (LEVITRA) 20 MG tablet Take 1 tablet (20 mg total) by mouth as needed for erectile dysfunction.  40 tablet  5  . tadalafil (CIALIS) 20 MG tablet Take 1 tablet (20 mg total) by mouth daily as needed for erectile dysfunction.  40 tablet  11   No current facility-administered medications on file prior to visit.   Review of Systems  Constitutional: Negative for unexpected weight change, or unusual diaphoresis  HENT: Negative for tinnitus.   Eyes: Negative for photophobia and visual disturbance.  Respiratory: Negative for choking and stridor.   Gastrointestinal: Negative for vomiting and blood in stool.  Genitourinary: Negative for hematuria and decreased urine volume.  Musculoskeletal: Negative for acute joint swelling Skin: Negative for color change and wound.  Neurological: Negative for tremors and numbness other than noted  Psychiatric/Behavioral: Negative for decreased concentration or  hyperactivity.       Objective:   Physical Exam BP 140/80  Pulse 82  Temp(Src) 98.3 F (36.8 C) (Oral)  Ht 5\' 9"  (1.753 m)  Wt 186 lb 6 oz (84.539 kg)  BMI 27.51 kg/m2  SpO2 97% VS noted, not ill appearing Constitutional: Pt appears well-developed and well-nourished.  HENT: Head: NCAT.  Right Ear: External ear normal.  Left Ear: External ear normal.  Eyes: Conjunctivae and EOM are normal. Pupils are equal, round, and reactive to light.  Neck: Normal range of motion. Neck supple.  Cardiovascular: Normal rate and regular rhythm.   Pulmonary/Chest:  Effort normal and breath sounds normal.  Neurological: Pt is alert. Not confused  Skin: Skin is warm. No erythema.  Psychiatric: Pt behavior is normal. Thought content normal.     Assessment & Plan:

## 2013-10-29 NOTE — Patient Instructions (Addendum)
Please take all new medication as prescribed - the flomax for the prostate (to start with, you can take before bedtime, or in the AM later if you like) Please continue all other medications as before, and refills have been done if requested. Please have the pharmacy call with any other refills you may need.  You will be contacted regarding the referral for: Dr Alen Blew for the blood counts, and the oral surgeon for the loose right upper wisdom tooth/molar  Please go to the LAB in the Basement (turn left off the elevator) for the tests to be done today You will be contacted by phone if any changes need to be made immediately.  Otherwise, you will receive a letter about your results with an explanation, but please check with MyChart first.  Please return in 6 months, or sooner if needed

## 2013-10-29 NOTE — Assessment & Plan Note (Signed)
For oral surgury referral 

## 2013-10-29 NOTE — Progress Notes (Signed)
Pre-visit discussion using our clinic review tool. No additional management support is needed unless otherwise documented below in the visit note.  

## 2013-10-29 NOTE — Assessment & Plan Note (Signed)
For trial flomax .4 qd, consider urology referral

## 2013-11-01 ENCOUNTER — Telehealth: Payer: Self-pay | Admitting: Oncology

## 2013-11-01 LAB — LDL CHOLESTEROL, DIRECT: Direct LDL: 101 mg/dL

## 2013-11-01 NOTE — Telephone Encounter (Signed)
left message for patient to return call to schedule appt °

## 2013-11-02 ENCOUNTER — Telehealth: Payer: Self-pay | Admitting: Oncology

## 2013-11-02 NOTE — Telephone Encounter (Signed)
patient called to scheduled appt for 03/13 @ 4 w/Dr. Alen Blew.  Calendar mailed.

## 2013-11-03 ENCOUNTER — Encounter: Payer: Self-pay | Admitting: Internal Medicine

## 2013-11-26 ENCOUNTER — Encounter: Payer: Self-pay | Admitting: Oncology

## 2013-11-26 ENCOUNTER — Telehealth: Payer: Self-pay | Admitting: Oncology

## 2013-11-26 ENCOUNTER — Ambulatory Visit (HOSPITAL_BASED_OUTPATIENT_CLINIC_OR_DEPARTMENT_OTHER): Payer: PRIVATE HEALTH INSURANCE | Admitting: Oncology

## 2013-11-26 VITALS — BP 140/64 | HR 95 | Temp 99.0°F | Resp 18 | Ht 69.0 in | Wt 179.2 lb

## 2013-11-26 DIAGNOSIS — Z7982 Long term (current) use of aspirin: Secondary | ICD-10-CM

## 2013-11-26 DIAGNOSIS — D759 Disease of blood and blood-forming organs, unspecified: Secondary | ICD-10-CM

## 2013-11-26 DIAGNOSIS — D473 Essential (hemorrhagic) thrombocythemia: Secondary | ICD-10-CM | POA: Diagnosis not present

## 2013-11-26 MED ORDER — HYDROXYUREA 500 MG PO CAPS: 500.0000 mg | ORAL_CAPSULE | Freq: Two times a day (BID) | ORAL | Status: DC

## 2013-11-26 NOTE — Telephone Encounter (Signed)
gv and printed appt sched and avs for pt for April adn May °

## 2013-11-26 NOTE — Progress Notes (Signed)
Hematology and Oncology Follow Up Visit  Dillon [name] [id number] 1945/11/27 68 y.o. 11/26/2013 4:08 PM  CC: Dillon Borg, MD    Principle Diagnosis: This is a 68 year old gentleman with a thrombocytosis JAK2 positive diagnosed in March 2010 presented as a form of myeloproliferative disorder confirmed by a bone marrow biopsy.   Current therapy:  1. He is supposed to be Hydrea 500 mg twice a day. He has not taken it for the last 2 years or so. 2. He is on aspirin 81 mg daily.    Interim History: Dillon [name] presents today for a followup visit.  He is a 68 year old gentleman with a myeloproliferative disorder outlined above. He was started on Hydrea 500 mg twice a day  In April of 2013. His platelets responded quite nicely to around 463. He failed to show up for the last 2 years and was referred back to me for reevaluation. He reports since his last visit he has been doing very well. He did not report any chest pain or difficulty breathing.  He did not report any nausea.  He did not report any vomiting.  He has not had any thrombotic events or hospitalization.  He has continued to perform activities of daily living without any major changes since the last time I saw him.  He has not had any bleeding.  He had not reported any epistaxis or vascular headaches or migraines. He have returned to work after a period of retirement.   Medications: I have reviewed the patient's current medications.   Current Outpatient Prescriptions  Medication Sig Dispense Refill  . amLODipine (NORVASC) 5 MG tablet Take 1 tablet (5 mg total) by mouth daily.  90 tablet  3  . aspirin 81 MG tablet Take 81 mg by mouth daily.        Marland Kitchen lovastatin (MEVACOR) 40 MG tablet Take 1 tablet (40 mg total) by mouth daily.  90 tablet  3  . tamsulosin (FLOMAX) 0.4 MG CAPS capsule Take 1 capsule (0.4 mg total) by mouth daily.  90 capsule  3  . vardenafil (LEVITRA) 20 MG tablet Take 1 tablet (20 mg total) by mouth as needed for  erectile dysfunction.  40 tablet  5  . hydroxyurea (HYDREA) 500 MG capsule Take 1 capsule (500 mg total) by mouth 2 (two) times daily. May take with food to minimize GI side effects.  90 capsule  3  . tadalafil (CIALIS) 20 MG tablet Take 1 tablet (20 mg total) by mouth daily as needed for erectile dysfunction.  40 tablet  11   No current facility-administered medications for this visit.    Allergies: No Known Allergies  Past Medical History, Surgical history, Social history, and Family History were reviewed and updated.  Review of Systems: Constitutional:  Negative for fever, chills, night sweats, anorexia, weight loss, pain. Cardiovascular: no chest pain or dyspnea on exertion Remaining ROS negative.  Physical Exam: Blood pressure 140/64, pulse 95, temperature 99 F (37.2 C), temperature source Oral, resp. rate 18, height 5' 9"  (1.753 m), weight 179 lb 3.2 oz (81.285 kg). ECOG: 1 General appearance: alert Head: Normocephalic, without obvious abnormality, atraumatic Neck: no adenopathy, no carotid bruit, no JVD, supple, symmetrical, trachea midline and thyroid not enlarged, symmetric, no tenderness/mass/nodules Lymph nodes: Cervical, supraclavicular, and axillary nodes normal. Heart:regular rate and rhythm, S1, S2 normal, no murmur, click, rub or gallop Lung:chest clear, no wheezing, rales, normal symmetric air entry Abdomin: soft, non-tender, without masses or organomegaly EXT:no erythema, induration,  or nodules  Lab Results: Lab Results  Component Value Date   WBC 14.3* 10/29/2013   HGB 18.6 Repeated and verified X2.* 10/29/2013   HCT 61.0 Repeated and verified X2.* 10/29/2013   MCV 84.7 10/29/2013   PLT 977.0* 10/29/2013     Chemistry      Component Value Date/Time   NA 140 10/29/2013 1713   K 4.1 10/29/2013 1713   CL 105 10/29/2013 1713   CO2 25 10/29/2013 1713   BUN 9 10/29/2013 1713   CREATININE 1.1 10/29/2013 1713      Component Value Date/Time   CALCIUM 9.8 10/29/2013  1713   ALKPHOS 59 04/28/2013 1346   AST 19 04/28/2013 1346   ALT 16 04/28/2013 1346   BILITOT 1.4* 04/28/2013 1346     Impression and Plan:  ASSESSMENT/PLAN:  This is a pleasant 68 year old gentleman with the following issues. 1. Essential thrombocythemia. This was diagnosed back in 2012 and was treated with hydroxyurea starting in April of 2013. At that time his blood counts dropped quite nicely to around 400,000 but since that time he failed to followup and have stopped hydroxyurea. Most recently, his white count was up to 977 but continued to be asymptomatic. The risks and benefits of restarted hydroxyurea was discussed today with the patient. Complications that includes nausea, vomiting, oral ulcers and delayed malignancy associated with his medications were discussed. I've also explained to him the risk of not controlling his thrombocytosis which put him at a risk of CVAs as well as myocardial infarctions. He is agreeable to restarted and we will do so at 500 mg twice a day.  2. Thrombosis prophylaxis.  He continues to be on low-dose aspirin.   3. Follow up in 6 weeks to recheck his counts and if they are adequate will followup again in 3 months.       [facility] 3/13/20154:08 PM

## 2013-12-28 ENCOUNTER — Encounter: Payer: Self-pay | Admitting: Gastroenterology

## 2014-01-11 ENCOUNTER — Other Ambulatory Visit: Payer: Medicare Other

## 2014-01-14 ENCOUNTER — Ambulatory Visit: Payer: Medicare Other | Admitting: Oncology

## 2014-05-06 ENCOUNTER — Encounter: Payer: Self-pay | Admitting: Internal Medicine

## 2014-05-06 ENCOUNTER — Ambulatory Visit (INDEPENDENT_AMBULATORY_CARE_PROVIDER_SITE_OTHER): Payer: PRIVATE HEALTH INSURANCE | Admitting: Internal Medicine

## 2014-05-06 VITALS — BP 132/90 | HR 77 | Temp 98.4°F | Ht 69.0 in | Wt 175.4 lb

## 2014-05-06 DIAGNOSIS — R7309 Other abnormal glucose: Secondary | ICD-10-CM | POA: Diagnosis not present

## 2014-05-06 DIAGNOSIS — Z860101 Personal history of adenomatous and serrated colon polyps: Secondary | ICD-10-CM | POA: Insufficient documentation

## 2014-05-06 DIAGNOSIS — N320 Bladder-neck obstruction: Secondary | ICD-10-CM

## 2014-05-06 DIAGNOSIS — I10 Essential (primary) hypertension: Secondary | ICD-10-CM | POA: Diagnosis not present

## 2014-05-06 DIAGNOSIS — E785 Hyperlipidemia, unspecified: Secondary | ICD-10-CM | POA: Diagnosis not present

## 2014-05-06 DIAGNOSIS — D751 Secondary polycythemia: Secondary | ICD-10-CM

## 2014-05-06 DIAGNOSIS — Z23 Encounter for immunization: Secondary | ICD-10-CM

## 2014-05-06 DIAGNOSIS — Z86010 Personal history of colonic polyps: Secondary | ICD-10-CM

## 2014-05-06 DIAGNOSIS — R7302 Impaired glucose tolerance (oral): Secondary | ICD-10-CM

## 2014-05-06 DIAGNOSIS — D45 Polycythemia vera: Secondary | ICD-10-CM

## 2014-05-06 HISTORY — DX: Personal history of colonic polyps: Z86.010

## 2014-05-06 HISTORY — DX: Personal history of adenomatous and serrated colon polyps: Z86.0101

## 2014-05-06 NOTE — Progress Notes (Signed)
Pre visit review using our clinic review tool, if applicable. No additional management support is needed unless otherwise documented below in the visit note. 

## 2014-05-06 NOTE — Assessment & Plan Note (Signed)
stable overall by history and exam, recent data reviewed with pt, and pt to continue medical treatment as before,  to f/u any worsening symptoms or concerns Lab Results  Component Value Date   LDLCALC 103* 04/27/2012

## 2014-05-06 NOTE — Assessment & Plan Note (Signed)
Ongoing, for f/u lab, pt does not currently have sched f/u with Dr Alen Blew,  So he is encouraged to call

## 2014-05-06 NOTE — Progress Notes (Signed)
Subjective:    Patient ID: Dillon [name], male    DOB: [birth year], 68 y.o.   MRN: [id number]  HPI  Here for yearly f/u;  Overall doing ok;  Pt denies CP, worsening SOB, DOE, wheezing, orthopnea, PND, worsening LE edema, palpitations, dizziness or syncope.  Pt denies neurological change such as new headache, facial or extremity weakness.  Pt denies polydipsia, polyuria, or low sugar symptoms. Pt states overall good compliance with treatment and medications, good tolerability, and has been trying to follow lower cholesterol diet.  Pt denies worsening depressive symptoms, suicidal ideation or panic. No fever, night sweats, wt loss, loss of appetite, or other constitutional symptoms.  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  Due for f/u labs and Oncology, also flu shot Past Medical History  Diagnosis Date  . ERECTILE DYSFUNCTION 11/15/2008  . FATIGUE 11/15/2008  . GERD 11/15/2008  . HYPERLIPIDEMIA 11/15/2008  . LEG PAIN, LEFT 11/05/2010  . MIGRAINE, COMMON 08/06/2010  . Nocturia 12/19/2008  . Swelling of limb 11/05/2010  . THROMBOCYTOSIS 12/19/2008    myleoproliferative disorder  . Impaired glucose tolerance 04/18/2011  . Bladder neck obstruction 04/18/2011  . Polycythemia 04/27/2012  . LVH (left ventricular hypertrophy) 04/27/2012  . Essential hypertension, benign 04/28/2013  . History of adenomatous polyp of colon 05/06/2014    2010   Past Surgical History  Procedure Laterality Date  . Appendectomy    . Tonsillectomy    . S/p esophageal dilation      reports that he quit smoking about 43 years ago. His smoking use included Cigarettes. He smoked 0.00 packs per day. He does not have any smokeless tobacco history on file. He reports that he drinks alcohol. He reports  that he does not use illicit drugs. family history includes Cancer in his father; Hypertension in his brother, mother, and sister. No Known Allergies Current Outpatient Prescriptions on File Prior to Visit  Medication Sig Dispense Refill  . amLODipine (NORVASC) 5 MG tablet Take 1 tablet (5 mg total) by mouth daily.  90 tablet  3  . aspirin 81 MG tablet Take 81 mg by mouth daily.        . hydroxyurea (HYDREA) 500 MG capsule Take 1 capsule (500 mg total) by mouth 2 (two) times daily. May take with food to minimize GI side effects.  90 capsule  3  . lovastatin (MEVACOR) 40 MG tablet Take 1 tablet (40 mg total) by mouth daily.  90 tablet  3  . tamsulosin (FLOMAX) 0.4 MG CAPS capsule Take 1 capsule (0.4 mg total) by mouth daily.  90 capsule  3  . tadalafil (CIALIS) 20 MG tablet Take 1 tablet (20 mg total) by mouth daily as needed for erectile dysfunction.  40 tablet  11  . vardenafil (LEVITRA) 20 MG tablet Take 1 tablet (20 mg total) by mouth as needed for erectile dysfunction.  40 tablet  5   No current facility-administered medications on file prior to visit.   Review of Systems Constitutional: Negative for increased diaphoresis, other activity, appetite or other siginficant weight change  HENT: Negative for worsening hearing loss, ear pain, facial swelling, mouth sores and neck stiffness.   Eyes: Negative for other worsening pain, redness or visual disturbance.  Respiratory: Negative for shortness of breath  and wheezing.   Cardiovascular: Negative for chest pain and palpitations.  Gastrointestinal: Negative for diarrhea, blood in stool, abdominal distention or other pain Genitourinary: Negative for hematuria, flank pain or change in urine volume.  Musculoskeletal: Negative for myalgias or other joint complaints.  Skin: Negative for color change and wound.  Neurological: Negative for syncope and numbness. other than noted Hematological: Negative for adenopathy. or other  swelling Psychiatric/Behavioral: Negative for hallucinations, self-injury, decreased concentration or other worsening agitation.      Objective:   Physical Exam BP 132/90  Pulse 77  Temp(Src) 98.4 F (36.9 C) (Oral)  Ht 5\' 9"  (1.753 m)  Wt 175 lb 6 oz (79.55 kg)  BMI 25.89 kg/m2  SpO2 98% VS noted,  Constitutional: Pt is oriented to person, place, and time. Appears well-developed and well-nourished.  Head: Normocephalic and atraumatic.  Right Ear: External ear normal.  Left Ear: External ear normal.  Nose: Nose normal.  Mouth/Throat: Oropharynx is clear and moist.  Eyes: Conjunctivae and EOM are normal. Pupils are equal, round, and reactive to light.  Neck: Normal range of motion. Neck supple. No JVD present. No tracheal deviation present.  Cardiovascular: Normal rate, regular rhythm, normal heart sounds and intact distal pulses.   Pulmonary/Chest: Effort normal and breath sounds without rales or wheezing  Abdominal: Soft. Bowel sounds are normal. NT. No HSM  Musculoskeletal: Normal range of motion. Exhibits no edema.  Lymphadenopathy:  Has no cervical adenopathy.  Neurological: Pt is alert and oriented to person, place, and time. Pt has normal reflexes. No cranial nerve deficit. Motor grossly intact Skin: Skin is warm and dry. No rash noted.  Psychiatric:  Has normal mood and affect. Behavior is normal.     Assessment & Plan:

## 2014-05-06 NOTE — Assessment & Plan Note (Signed)
stable overall by history and exam, recent data reviewed with pt, and pt to continue medical treatment as before,  to f/u any worsening symptoms or concerns BP Readings from Last 3 Encounters:  05/06/14 132/90  11/26/13 140/64  10/29/13 140/80

## 2014-05-06 NOTE — Assessment & Plan Note (Signed)
Also for psa as he is due, asympt

## 2014-05-06 NOTE — Assessment & Plan Note (Signed)
stable overall by history and exam, recent data reviewed with pt, and pt to continue medical treatment as before,  to f/u any worsening symptoms or concerns Lab Results  Component Value Date   HGBA1C 5.6 04/28/2013   For f/u today

## 2014-05-06 NOTE — Patient Instructions (Addendum)
You had the flu shot today  Please call for your next followup appt with Dr Leonor Liv will be contacted regarding the referral for: colonoscopy  Please continue all other medications as before, and refills have been done if requested - the cialis  Please have the pharmacy call with any other refills you may need.  Please continue your efforts at being more active, low cholesterol diet, and weight control.  You are otherwise up to date with prevention measures today.  Please keep your appointments with your specialists as you may have planned  Please go to the LAB in the Basement (turn left off the elevator) for the tests to be done today  You will be contacted by phone if any changes need to be made immediately.  Otherwise, you will receive a letter about your results with an explanation, but please check with MyChart first.  Please remember to sign up for MyChart if you have not done so, as this will be important to you in the future with finding out test results, communicating by private email, and scheduling acute appointments online when needed.  Please return in 6 months, or sooner if needed

## 2014-05-06 NOTE — Assessment & Plan Note (Signed)
Also due for f/u colonoscopy

## 2014-05-09 ENCOUNTER — Telehealth: Payer: Self-pay | Admitting: Internal Medicine

## 2014-05-09 NOTE — Telephone Encounter (Signed)
Relevant patient education mailed to patient.  

## 2014-05-11 ENCOUNTER — Encounter: Payer: Self-pay | Admitting: Gastroenterology

## 2014-07-04 ENCOUNTER — Ambulatory Visit (AMBULATORY_SURGERY_CENTER): Payer: Self-pay | Admitting: *Deleted

## 2014-07-04 VITALS — Ht 69.0 in | Wt 176.8 lb

## 2014-07-04 DIAGNOSIS — Z86010 Personal history of colonic polyps: Secondary | ICD-10-CM

## 2014-07-04 MED ORDER — NA SULFATE-K SULFATE-MG SULF 17.5-3.13-1.6 GM/177ML PO SOLN: 1.0000 | Freq: Once | ORAL | Status: DC

## 2014-07-04 NOTE — Progress Notes (Signed)
No egg or soy allergy. ewm No home 02 use. ewm No diet pills. ewm No problems with past sedation. ewm No blood thinners. ewm

## 2014-07-18 ENCOUNTER — Ambulatory Visit (AMBULATORY_SURGERY_CENTER): Payer: PRIVATE HEALTH INSURANCE | Admitting: Gastroenterology

## 2014-07-18 ENCOUNTER — Encounter: Payer: Self-pay | Admitting: Gastroenterology

## 2014-07-18 VITALS — BP 151/99 | HR 92 | Temp 97.4°F | Resp 22 | Ht 69.0 in | Wt 176.0 lb

## 2014-07-18 DIAGNOSIS — D120 Benign neoplasm of cecum: Secondary | ICD-10-CM

## 2014-07-18 DIAGNOSIS — K5731 Diverticulosis of large intestine without perforation or abscess with bleeding: Secondary | ICD-10-CM

## 2014-07-18 DIAGNOSIS — M129 Arthropathy, unspecified: Secondary | ICD-10-CM | POA: Diagnosis not present

## 2014-07-18 DIAGNOSIS — Z86010 Personal history of colonic polyps: Secondary | ICD-10-CM

## 2014-07-18 DIAGNOSIS — I10 Essential (primary) hypertension: Secondary | ICD-10-CM | POA: Diagnosis not present

## 2014-07-18 MED ORDER — SODIUM CHLORIDE 0.9 % IV SOLN: 500.0000 mL | INTRAVENOUS | Status: DC

## 2014-07-18 NOTE — Patient Instructions (Signed)

## 2014-07-18 NOTE — Progress Notes (Signed)
Called to room to assist during endoscopic procedure.  Patient ID and intended procedure confirmed with present staff. Received instructions for my participation in the procedure from the performing physician.  

## 2014-07-18 NOTE — Progress Notes (Signed)
Report to PACU, RN, vss, BBS= Clear.  

## 2014-07-18 NOTE — Progress Notes (Signed)
Pt's blood pressure was 163/102 left arm and 154/107 right arm.  Pt said he last took his b/p med 07-16-14.  I made Josh Monday, CRNA aware of the blood pressure readings pre-procedure and he said they would proceed as scheduled with colonoscopy. maw

## 2014-07-18 NOTE — Op Note (Signed)
[facility]  [address]. [place], [id number]   COLONOSCOPY PROCEDURE REPORT  PATIENT: Dillon [name], Dillon [name]  MR#: [id number] BIRTHDATE: 12-31-1945 , 87  yrs. old GENDER: male ENDOSCOPIST: Inda Castle, MD REFERRED WC:BJSEG John, M.D. PROCEDURE DATE:  07/18/2014 PROCEDURE:   Colonoscopy with cold biopsy polypectomy First Screening Colonoscopy - Avg.  risk and is 50 yrs.  old or older - No.  Prior Negative Screening - Now for repeat screening. N/A  History of Adenoma - Now for follow-up colonoscopy & has been > or = to 3 yrs.  Yes hx of adenoma.  Has been 3 or more years since last colonoscopy.  Polyps Removed Today? Yes. ASA CLASS:   Class II INDICATIONS:high risk personal history of colonic polyps.2010 MEDICATIONS: Monitored anesthesia care and Propofol 250 mg IV  DESCRIPTION OF PROCEDURE:   After the risks benefits and alternatives of the procedure were thoroughly explained, informed consent was obtained.  The digital rectal exam revealed no abnormalities of the rectum.   The LB [id number]  endoscope was introduced through the anus and advanced to the cecum, which was identified by both the appendix and ileocecal valve. No adverse events experienced.   The quality of the prep was Suprep good  The instrument was then slowly withdrawn as the colon was fully examined.      COLON FINDINGS: A sessile polyp measuring 2 mm in size was found at the cecum.  A polypectomy was performed with cold forceps.   There was moderate diverticulosis noted in the ascending colon.   There was mild diverticulosis noted in the sigmoid colon.  Retroflexed views revealed no abnormalities. The time to cecum=5 minutes 09 seconds.  Withdrawal time=10 minutes 38 seconds.  The scope was withdrawn and the procedure completed. COMPLICATIONS: There were no immediate complications.  ENDOSCOPIC IMPRESSION: 1.   Sessile polyp measuring 2 mm in size was found at the  cecum; polypectomy was performed with cold forceps 2.   Moderate diverticulosis was noted in the ascending colon 3.   Mild diverticulosis was noted in the sigmoid colon  RECOMMENDATIONS: If the polyp(s) removed today are proven to be adenomatous (pre-cancerous) polyps, you will need a repeat colonoscopy in 5 years.  Otherwise you should continue to follow colorectal cancer screening guidelines for "routine risk" patients with colonoscopy in 10 years.  You will receive a letter within 1-2 weeks with the results of your biopsy as well as final recommendations.  Please call my office if you have not received a letter after 3 weeks.  eSigned:  Inda Castle, MD 07/18/2014 10:28 AM   cc:   PATIENT NAME:  Dillon [name], Dillon [name] MR#: [id number]

## 2014-07-19 ENCOUNTER — Telehealth: Payer: Self-pay | Admitting: *Deleted

## 2014-07-19 NOTE — Telephone Encounter (Signed)
No answer, message left for the patient. 

## 2014-07-22 ENCOUNTER — Encounter: Payer: Self-pay | Admitting: Gastroenterology

## 2014-11-09 ENCOUNTER — Other Ambulatory Visit: Payer: Self-pay | Admitting: Internal Medicine

## 2014-11-11 ENCOUNTER — Other Ambulatory Visit: Payer: Self-pay | Admitting: Internal Medicine

## 2014-11-11 ENCOUNTER — Ambulatory Visit (INDEPENDENT_AMBULATORY_CARE_PROVIDER_SITE_OTHER): Payer: PRIVATE HEALTH INSURANCE | Admitting: Internal Medicine

## 2014-11-11 ENCOUNTER — Other Ambulatory Visit (INDEPENDENT_AMBULATORY_CARE_PROVIDER_SITE_OTHER): Payer: PRIVATE HEALTH INSURANCE

## 2014-11-11 VITALS — BP 122/80 | HR 80 | Temp 97.8°F | Resp 16 | Ht 69.0 in | Wt 179.0 lb

## 2014-11-11 DIAGNOSIS — E785 Hyperlipidemia, unspecified: Secondary | ICD-10-CM

## 2014-11-11 DIAGNOSIS — I10 Essential (primary) hypertension: Secondary | ICD-10-CM | POA: Diagnosis not present

## 2014-11-11 DIAGNOSIS — N320 Bladder-neck obstruction: Secondary | ICD-10-CM

## 2014-11-11 DIAGNOSIS — R7302 Impaired glucose tolerance (oral): Secondary | ICD-10-CM | POA: Diagnosis not present

## 2014-11-11 LAB — CBC WITH DIFFERENTIAL/PLATELET
BASOS PCT: 0.3 % (ref 0.0–3.0)
Basophils Absolute: 0 10*3/uL (ref 0.0–0.1)
Eosinophils Absolute: 0.3 10*3/uL (ref 0.0–0.7)
Eosinophils Relative: 2.2 % (ref 0.0–5.0)
HCT: 55.5 % — ABNORMAL HIGH (ref 39.0–52.0)
Hemoglobin: 17.1 g/dL — ABNORMAL HIGH (ref 13.0–17.0)
LYMPHS PCT: 12.9 % (ref 12.0–46.0)
Lymphs Abs: 1.9 10*3/uL (ref 0.7–4.0)
MCHC: 30.8 g/dL (ref 30.0–36.0)
MCV: 75.1 fl — AB (ref 78.0–100.0)
Monocytes Absolute: 0.7 10*3/uL (ref 0.1–1.0)
Monocytes Relative: 4.9 % (ref 3.0–12.0)
Neutro Abs: 11.9 10*3/uL — ABNORMAL HIGH (ref 1.4–7.7)
Neutrophils Relative %: 79.7 % — ABNORMAL HIGH (ref 43.0–77.0)
Platelets: 570 10*3/uL — ABNORMAL HIGH (ref 150.0–400.0)
RDW: 21.7 % — ABNORMAL HIGH (ref 11.5–15.5)
WBC: 14.9 10*3/uL — ABNORMAL HIGH (ref 4.0–10.5)

## 2014-11-11 LAB — URINALYSIS, ROUTINE W REFLEX MICROSCOPIC
BILIRUBIN URINE: NEGATIVE
KETONES UR: NEGATIVE
Leukocytes, UA: NEGATIVE
Nitrite: NEGATIVE
PH: 6.5 (ref 5.0–8.0)
SPECIFIC GRAVITY, URINE: 1.02 (ref 1.000–1.030)
Total Protein, Urine: NEGATIVE
UROBILINOGEN UA: 0.2 (ref 0.0–1.0)
Urine Glucose: NEGATIVE

## 2014-11-11 LAB — LIPID PANEL
CHOLESTEROL: 125 mg/dL (ref 0–200)
HDL: 55.6 mg/dL (ref >39.00)
LDL Cholesterol: 43 mg/dL (ref 0–99)
NONHDL: 69.4
TRIGLYCERIDES: 132 mg/dL (ref 0.0–149.0)
Total CHOL/HDL Ratio: 2
VLDL: 26.4 mg/dL (ref 0.0–40.0)

## 2014-11-11 LAB — HEPATIC FUNCTION PANEL
ALK PHOS: 68 U/L (ref 39–117)
ALT: 15 U/L (ref 0–53)
AST: 15 U/L (ref 0–37)
Albumin: 4.4 g/dL (ref 3.5–5.2)
Bilirubin, Direct: 0.2 mg/dL (ref 0.0–0.3)
Total Bilirubin: 0.8 mg/dL (ref 0.2–1.2)
Total Protein: 7.2 g/dL (ref 6.0–8.3)

## 2014-11-11 LAB — BASIC METABOLIC PANEL
BUN: 20 mg/dL (ref 6–23)
CHLORIDE: 102 meq/L (ref 96–112)
CO2: 27 mEq/L (ref 19–32)
Calcium: 9.6 mg/dL (ref 8.4–10.5)
Creatinine, Ser: 1.13 mg/dL (ref 0.40–1.50)
GFR: 82.25 mL/min (ref >60.00)
GLUCOSE: 93 mg/dL (ref 70–99)
POTASSIUM: 3.9 meq/L (ref 3.5–5.1)
SODIUM: 135 meq/L (ref 135–145)

## 2014-11-11 LAB — PSA: PSA: 1.54 ng/mL (ref 0.10–4.00)

## 2014-11-11 LAB — TSH: TSH: 1.93 u[IU]/mL (ref 0.35–4.50)

## 2014-11-11 NOTE — Patient Instructions (Signed)

## 2014-11-11 NOTE — Progress Notes (Signed)
Pre visit review using our clinic review tool, if applicable. No additional management support is needed unless otherwise documented below in the visit note. 

## 2014-11-13 NOTE — Assessment & Plan Note (Signed)
Also for psa as he is due, asympt

## 2014-11-13 NOTE — Assessment & Plan Note (Signed)
stable overall by history and exam, recent data reviewed with pt, and pt to continue medical treatment as before,  to f/u any worsening symptoms or concerns Lab Results  Component Value Date   LDLCALC 43 11/11/2014   For f/u labs

## 2014-11-13 NOTE — Assessment & Plan Note (Signed)
stable overall by history and exam, recent data reviewed with pt, and pt to continue medical treatment as before,  to f/u any worsening symptoms or concerns BP Readings from Last 3 Encounters:  11/11/14 122/80  07/18/14 151/99  05/06/14 132/90

## 2014-11-13 NOTE — Assessment & Plan Note (Signed)
stable overall by history and exam, recent data reviewed with pt, and pt to continue medical treatment as before,  to f/u any worsening symptoms or concerns Lab Results  Component Value Date   HGBA1C 5.6 04/28/2013

## 2014-11-13 NOTE — Progress Notes (Signed)
Subjective:    Patient ID: Dillon [name], male    DOB: Jun 23, 1946, 69 y.o.   MRN: [id number]  HPI  Here for yearly f/u;  Overall doing ok;  Pt denies CP, worsening SOB, DOE, wheezing, orthopnea, PND, worsening LE edema, palpitations, dizziness or syncope.  Pt denies neurological change such as new headache, facial or extremity weakness.  Pt denies polydipsia, polyuria, or low sugar symptoms. Pt states overall good compliance with treatment and medications, good tolerability, and has been trying to follow lower cholesterol diet.  Pt denies worsening depressive symptoms, suicidal ideation or panic. No fever, night sweats, wt loss, loss of appetite, or other constitutional symptoms.  Pt states good ability with ADL's, has low fall risk, home safety reviewed and adequate, no other significant changes in hearing or vision, and only occasionally active with exercise. No new complaints. Denies urinary symptoms such as dysuria, frequency, urgency, flank pain, hematuria or n/v, fever, chills. Past Medical History  Diagnosis Date  . ERECTILE DYSFUNCTION 11/15/2008  . FATIGUE 11/15/2008  . GERD 11/15/2008  . HYPERLIPIDEMIA 11/15/2008  . LEG PAIN, LEFT 11/05/2010  . MIGRAINE, COMMON 08/06/2010  . Nocturia 12/19/2008  . Swelling of limb 11/05/2010  . THROMBOCYTOSIS 12/19/2008    myleoproliferative disorder  . Impaired glucose tolerance 04/18/2011  . Bladder neck obstruction 04/18/2011  . Polycythemia 04/27/2012  . LVH (left ventricular hypertrophy) 04/27/2012  . Essential hypertension, benign 04/28/2013  . History of adenomatous polyp of colon 05/06/2014    2010  . Arthritis     right shoulder   Past Surgical History  Procedure Laterality Date  . Appendectomy    . Tonsillectomy    . S/p esophageal dilation    . Upper gastrointestinal endoscopy    . Colonoscopy    . Polypectomy    . Circumcision      30+yrs ago    reports that he quit smoking about 44 years ago. His smoking use included Cigarettes. He has  never used smokeless tobacco. He reports that he drinks alcohol. He reports that he does not use illicit drugs. family history includes Cancer in his father; Hypertension in his brother, mother, and sister; Prostate cancer in his father. There is no history of Colon cancer, Rectal cancer, Stomach cancer, Esophageal cancer, or Pancreatic cancer. No Known Allergies Current Outpatient Prescriptions on File Prior to Visit  Medication Sig Dispense Refill  . amLODipine (NORVASC) 5 MG tablet TAKE 1 TABLET (5 MG TOTAL) BY MOUTH DAILY. 90 tablet 1  . aspirin 81 MG tablet Take 81 mg by mouth daily.      . hydroxyurea (HYDREA) 500 MG capsule Take 1 capsule (500 mg total) by mouth 2 (two) times daily. May take with food to minimize GI side effects. 90 capsule 3  . tadalafil (CIALIS) 20 MG tablet Take 1 tablet (20 mg total) by mouth daily as needed for erectile dysfunction. 40 tablet 11   No current facility-administered medications on file prior to visit.   Review of Systems  Constitutional: Negative for unusual diaphoresis or other sweats  HENT: Negative for ringing in ear Eyes: Negative for double vision or worsening visual disturbance.  Respiratory: Negative for choking and stridor.   Gastrointestinal: Negative for vomiting or other signifcant bowel change Genitourinary: Negative for hematuria or decreased urine volume.  Musculoskeletal: Negative for other MSK pain or swelling Skin: Negative for color change and worsening wound.  Neurological: Negative for tremors and numbness other than noted  Psychiatric/Behavioral: Negative for decreased concentration  or agitation other than above       Objective:   Physical Exam BP 122/80 mmHg  Pulse 80  Temp(Src) 97.8 F (36.6 C) (Oral)  Resp 16  Ht 5\' 9"  (1.753 m)  Wt 179 lb (81.194 kg)  BMI 26.42 kg/m2  SpO2 95% VS noted,  Constitutional: Pt is oriented to person, place, and time. Appears well-developed and well-nourished.  Head: Normocephalic and  atraumatic.  Right Ear: External ear normal.  Left Ear: External ear normal.  Nose: Nose normal.  Mouth/Throat: Oropharynx is clear and moist.  Eyes: Conjunctivae and EOM are normal. Pupils are equal, round, and reactive to light.  Neck: Normal range of motion. Neck supple. No JVD present. No tracheal deviation present.  Cardiovascular: Normal rate, regular rhythm, normal heart sounds and intact distal pulses.   Pulmonary/Chest: Effort normal and breath sounds without rales or wheezing  Abdominal: Soft. Bowel sounds are normal. NT. No HSM  Musculoskeletal: Normal range of motion. Exhibits no edema.  Lymphadenopathy:  Has no cervical adenopathy.  Neurological: Pt is alert and oriented to person, place, and time. Pt has normal reflexes. No cranial nerve deficit. Motor grossly intact Skin: Skin is warm and dry. No rash noted.  Psychiatric:  Has normal mood and affect. Behavior is normal.     Assessment & Plan:

## 2014-11-15 ENCOUNTER — Encounter: Payer: Self-pay | Admitting: Internal Medicine

## 2015-04-27 ENCOUNTER — Telehealth: Payer: Self-pay

## 2015-04-27 NOTE — Telephone Encounter (Signed)
Outreach to fup on AWV completion; Patient called to educate on Medicare Wellness apt. LVM for the patient to call back to educate and schedule for wellness visit   to [phone] or [phone]

## 2015-05-01 NOTE — Telephone Encounter (Signed)
Call to Dillon [name] and he agreed to come in for AWV Thursday the 18th at 4pm p work

## 2015-05-04 ENCOUNTER — Ambulatory Visit (INDEPENDENT_AMBULATORY_CARE_PROVIDER_SITE_OTHER): Payer: PRIVATE HEALTH INSURANCE

## 2015-05-04 VITALS — BP 160/90 | Ht 68.0 in | Wt 181.5 lb

## 2015-05-04 DIAGNOSIS — Z0000 Encounter for general adult medical examination without abnormal findings: Secondary | ICD-10-CM

## 2015-05-04 DIAGNOSIS — Z23 Encounter for immunization: Secondary | ICD-10-CM

## 2015-05-04 NOTE — Progress Notes (Addendum)
Subjective:   Dillon [name] is a 69 y.o. male who presents for an Initial Medicare Annual Wellness Visit.  Review of Systems  HRA assessment completed during visit; Dillon [name]  Patient is here for Annual Wellness Assessment:  The Patient was informed that this wellness visit is to identify risk and educate on how to reduce risk for increase disease through lifestyle changes.  The patient verbalized understanding that any voiced medical issues still need to be directed to their physician.     Problem list reviewed and are being managed medically;  Will educate for lifestyle changes as appropriate for HTN; IGT; Hyperlipidemia Lipids cho 125; Tri 132; HDL 55.60; LDL 43; ratio 2; (Lovastatin 40)  Describes his health as good;   Working and mows yards;  Schedule at work / 5 days a week;  Dillon [name] 69 yo who currently lives with his sister; States he was one of 11 children;  Family still very close;   Problem list review for risk   DIET BMI: 27.5 [place] and fish; eat pork in moderation; he does monitor his sodium; green beans; rinses canned vegetables Breakfast; Lunch; chicken and pork chop; supper;  Does like to back pies  EXERCISE Exercise; up on feet; up at 5am; home at 4pm;  Sleep at hs 5 to 6 hours States he is active and stays busy; has no complaints  Family Hx Cancer in his father; Hypertension in his brother, mother, and sister; Prostate cancer in his father.  Safety;  Home one level; no falls; smoke detectors;  No Driving; accidents; Educated on decreasing risk by not climbing ladders; keep paths in home clear  Discussed Goal to improve health based on risk; smiles and states he will take his BP med/ Educated on monitoring sodium intake  Screenings overdue Hepatitis C Screening; educated on screen and will discuss with Dr. Jenny Reichmann if he wants to be screened. Did review list of hepatitis risk; Immunizations; PVS 23 in 2008 (69yo at 69 yo) Prevnar 07/2013/ DUE PCV 23  per CDC guidelines Agreed to take PCV 23 today ________________________________________________ Ophthalmology exam; about 3 or 4 years; Dr. Gershon Crane;  Shingles 02/2007 PSA completed  Colonoscopy; 07/2014; had hx polyps; report may repeat in 5 yeas; 2020; EKG 04/2013 Hearing: 4000hz  both ears Dental: go to [place] Team reviewed and updated Dr. Erskine Emery GI Dr. Alen Blew, [facility]  Oncology- see if Dr. Jenny Reichmann sends him  Cardiac Risk Factors include: advanced age (>2men, >59 women);dyslipidemia;male gender;hypertension    Objective:    Today's Vitals   05/04/15 1623  BP: 160/90  Height: 5\' 8"  (1.727 m)  Weight: 181 lb 8 oz (82.328 kg)    Current Medications (verified) Outpatient Encounter Prescriptions as of 05/04/2015  Medication Sig  . amLODipine (NORVASC) 5 MG tablet TAKE 1 TABLET (5 MG TOTAL) BY MOUTH DAILY.  Marland Kitchen aspirin 81 MG tablet Take 81 mg by mouth daily.    . hydroxyurea (HYDREA) 500 MG capsule Take 1 capsule (500 mg total) by mouth 2 (two) times daily. May take with food to minimize GI side effects.  . lovastatin (MEVACOR) 40 MG tablet TAKE 1 TABLET (40 MG TOTAL) BY MOUTH DAILY.  . tamsulosin (FLOMAX) 0.4 MG CAPS capsule TAKE 1 CAPSULE (0.4 MG TOTAL) BY MOUTH DAILY.  . tadalafil (CIALIS) 20 MG tablet Take 1 tablet (20 mg total) by mouth daily as needed for erectile dysfunction.   No facility-administered encounter medications on file as of 05/04/2015.    Allergies (  verified) Review of patient's allergies indicates no known allergies.   History: Past Medical History  Diagnosis Date  . ERECTILE DYSFUNCTION 11/15/2008  . FATIGUE 11/15/2008  . GERD 11/15/2008  . HYPERLIPIDEMIA 11/15/2008  . LEG PAIN, LEFT 11/05/2010  . MIGRAINE, COMMON 08/06/2010  . Nocturia 12/19/2008  . Swelling of limb 11/05/2010  . THROMBOCYTOSIS 12/19/2008    myleoproliferative disorder  . Impaired glucose tolerance 04/18/2011  . Bladder neck obstruction 04/18/2011  . Polycythemia 04/27/2012    . LVH (left ventricular hypertrophy) 04/27/2012  . Essential hypertension, benign 04/28/2013  . History of adenomatous polyp of colon 05/06/2014    2010  . Arthritis     right shoulder   Past Surgical History  Procedure Laterality Date  . Appendectomy    . Tonsillectomy    . S/p esophageal dilation    . Upper gastrointestinal endoscopy    . Colonoscopy    . Polypectomy    . Circumcision      30+yrs ago   Family History  Problem Relation Age of Onset  . Hypertension Mother   . Cancer Father     prostate cancer  . Prostate cancer Father   . Hypertension Sister   . Hypertension Brother   . Colon cancer Neg Hx   . Rectal cancer Neg Hx   . Stomach cancer Neg Hx   . Esophageal cancer Neg Hx   . Pancreatic cancer Neg Hx    Social History   Occupational History  . Engineer, manufacturing systems    Social History Main Topics  . Smoking status: Former Smoker -- 0.50 packs/day for 12 years    Types: Cigarettes    Quit date: 09/16/1970  . Smokeless tobacco: Never Used     Comment: hasn't smoked in 46 years;   . Alcohol Use: 0.0 oz/week    0 Standard drinks or equivalent per week     Comment: occasional;   . Drug Use: No  . Sexual Activity: Not on file   Tobacco Counseling Counseling given: Not Answered   Activities of Daily Living In your present state of health, do you have any difficulty performing the following activities: 05/04/2015  Hearing? N  Vision? N  Difficulty concentrating or making decisions? N  Walking or climbing stairs? N  Dressing or bathing? N  Doing errands, shopping? N  Preparing Food and eating ? N  Using the Toilet? N  In the past six months, have you accidently leaked urine? N  Do you have problems with loss of bowel control? N  Managing your Medications? N  Managing your Finances? N  Housekeeping or managing your Housekeeping? N    Immunizations and Health Maintenance Immunization History  Administered Date(s) Administered  . H1N1 11/15/2008  .  Influenza Split 07/28/2012  . Influenza Whole 11/29/2009  . Influenza, Seasonal, Injecte, Preservative Fre 06/16/2013  . Influenza,inj,Quad PF,36+ Mos 05/06/2014  . Pneumococcal Conjugate-13 07/29/2013  . Pneumococcal Polysaccharide-23 03/04/2007, 05/04/2015  . Td 11/15/2008  . Zoster 03/04/2007   Health Maintenance Due  Topic Date Due  . Hepatitis C Screening  1945-12-04  . INFLUENZA VACCINE  04/17/2015    Patient Care Team: Biagio Borg, MD as PCP - General Inda Castle, MD as Consulting Physician (Gastroenterology)  Indicate any recent Medical Services you may have received from other than [facility] providers in the past year (date may be approximate).    Assessment:   This is a routine wellness examination for Betsy Layne.    Personalized Education was  given regarding PSV 23; taking BP med; States he just forgets at times; but will try and remember. No side effects voiced   Assessment included: smoking (secondary) educated but has not smoked in 46 years; smoked 1/2 pack x 12 years; for 6 pack hx.   Stated he was taking meds without issues; no barriers identified. Stress not voiced; No Risk for hepatitis verbalized after reviewing screen. No  high risk social behavior voiced.  Cognition assessed by AD8; Score 0 MMSE deferred as the patient stated they had no memory issues; No identified risk were noted; The patient was oriented x 3; appropriate in dress and manner and no objective failures at ADL's or IADL's. Still works 5 days a week and enjoys his work  Depression screen negative;   Functional status reviewed; no losses in function x 1 year  Bowel and bladder issues assessed. Ned assisting with bladder issue so no problem at present  End of life planning was discussed; states his sister Inez Catalina is his 61 and this is known in the family.    Hearing/Vision screen  Hearing Screening   125Hz  250Hz  500Hz  1000Hz  2000Hz  4000Hz  8000Hz   Right ear:      100   Left ear:      100      Dietary issues and exercise activities discussed: Current Exercise Habits:: Home exercise routine (mow the yard; work at home and job; on Forensic scientist )  Goals    . htn control     Will commit to taking medicine;   will continue to stay active Watch sodium intake       Depression Screen PHQ 2/9 Scores 05/04/2015 05/06/2014 10/29/2013 07/29/2013  PHQ - 2 Score 0 0 0 1    Fall Risk Fall Risk  05/04/2015 05/06/2014 10/29/2013 07/29/2013 04/28/2013  Falls in the past year? No No Yes Yes No  Number falls in past yr: - - 1 1 -  Injury with Fall? - - No No -    Cognitive Function: MMSE - Mini Mental State Exam 05/04/2015  Not completed: Unable to complete    Screening Tests Health Maintenance  Topic Date Due  . Hepatitis C Screening  04/17/1946  . INFLUENZA VACCINE  04/17/2015  . TETANUS/TDAP  11/16/2018  . COLONOSCOPY  07/19/2019  . ZOSTAVAX  Completed  . PNA vac Low Risk Adult  Completed        Plan:     1. Agrees to  Take bp med 2. Will come in to the office and schedule nurse visit with Jamile Rekowski to check BP after taking medication every day x 2 weeks. Will monitor sodium and educated on limiting sodium;   May want to have an eye exam this year. Will review Hepatitis C with Dr. Jenny Reichmann if you would like to discuss further  Will take the Pneumovax 23 today  During the course of the visit Lucion was educated and counseled about the following appropriate screening and preventive services:   Vaccines to include Pneumoccal, Influenza, Hepatitis B, Td, Zostavax, HCV/ pneumonia series completed post 42  Electrocardiogram/ 04/2013  Colorectal cancer screening/2015 and recommended another in 5 years; 2020  Cardiovascular disease screening/ 2 d echo completed  Diabetes screening/ no issues at present/   Glaucoma screening/ Will make eye apt this year  Nutrition counseling/ reviewed  Prostate cancer screening/ completed by Dr. Jenny Reichmann  Smoking cessation counseling/  n/a  Patient Instructions (the written plan) were given to the patient.   [id number],[id number], RN  05/04/2015    Medical screening examination/treatment/procedure(s) were performed by non-physician practitioner and as supervising physician I was immediately available for consultation/collaboration. I agree with above. Cathlean Cower, MD

## 2015-05-04 NOTE — Patient Instructions (Addendum)
Mr. Schoeneck , Thank you for taking time to come for your Medicare Wellness Visit. I appreciate your ongoing commitment to your health goals. Please review the following plan we discussed and let me know if I can assist you in the future.   1. Take bp med 2. Will come in to the office and schedule nurse visit with Teasha Murrillo to check BP after taking medication every day x 2 weeks  May want to have an eye exam this year. Will review Hepatitis C with Dr. Jenny Reichmann if you would like to discuss further  Will take the Pneumovax 23 today These are the goals we discussed: Goals    . htn control     Will commit to taking medicine;   will continue to stay active Watch sodium intake        This is a list of the screening recommended for you and due dates:  Health Maintenance  Topic Date Due  .  Hepatitis C: One time screening is recommended by Center for Disease Control  (CDC) for  adults born from [birth year] through 1967.   May 05, 1946  . Pneumonia vaccines (2 of 2 - PPSV23) 07/29/2014  . Flu Shot  04/17/2015  . Tetanus Vaccine  11/16/2018  . Colon Cancer Screening  07/19/2019  . Shingles Vaccine  Completed     Low-Sodium Eating Plan Sodium raises blood pressure and causes water to be held in the body. Getting less sodium from food will help lower your blood pressure, reduce any swelling, and protect your heart, liver, and kidneys. We get sodium by adding salt (sodium chloride) to food. Most of our sodium comes from canned, boxed, and frozen foods. Restaurant foods, fast foods, and pizza are also very high in sodium. Even if you take medicine to lower your blood pressure or to reduce fluid in your body, getting less sodium from your food is important. WHAT IS MY PLAN? Most people should limit their sodium intake to 2,300 mg a day. Your health care provider recommends that you limit your sodium intake to __________ a day.  WHAT DO I NEED TO KNOW ABOUT THIS EATING PLAN? For the low-sodium eating plan, you  will follow these general guidelines:  Choose foods with a % Daily Value for sodium of less than 5% (as listed on the food label).   Use salt-free seasonings or herbs instead of table salt or sea salt.   Check with your health care provider or pharmacist before using salt substitutes.   Eat fresh foods.  Eat more vegetables and fruits.  Limit canned vegetables. If you do use them, rinse them well to decrease the sodium.   Limit cheese to 1 oz (28 g) per day.   Eat lower-sodium products, often labeled as "lower sodium" or "no salt added."  Avoid foods that contain monosodium glutamate (MSG). MSG is sometimes added to [place] food and some canned foods.  Check food labels (Nutrition Facts labels) on foods to learn how much sodium is in one serving.  Eat more home-cooked food and less restaurant, buffet, and fast food.  When eating at a restaurant, ask that your food be prepared with less salt or none, if possible.  HOW DO I READ FOOD LABELS FOR SODIUM INFORMATION? The Nutrition Facts label lists the amount of sodium in one serving of the food. If you eat more than one serving, you must multiply the listed amount of sodium by the number of servings. Food labels may also identify foods as:  Sodium free--Less than 5 mg in a serving.  Very low sodium--35 mg or less in a serving.  Low sodium--140 mg or less in a serving.  Light in sodium--50% less sodium in a serving. For example, if a food that usually has 300 mg of sodium is changed to become light in sodium, it will have 150 mg of sodium.  Reduced sodium--25% less sodium in a serving. For example, if a food that usually has 400 mg of sodium is changed to reduced sodium, it will have 300 mg of sodium. WHAT FOODS CAN I EAT? Grains Low-sodium cereals, including oats, puffed wheat and rice, and shredded wheat cereals. Low-sodium crackers. Unsalted rice and pasta. Lower-sodium bread.  Vegetables Frozen or fresh  vegetables. Low-sodium or reduced-sodium canned vegetables. Low-sodium or reduced-sodium tomato sauce and paste. Low-sodium or reduced-sodium tomato and vegetable juices.  Fruits Fresh, frozen, and canned fruit. Fruit juice.  Meat and Other Protein Products Low-sodium canned tuna and salmon. Fresh or frozen meat, poultry, seafood, and fish. Lamb. Unsalted nuts. Dried beans, peas, and lentils without added salt. Unsalted canned beans. Homemade soups without salt. Eggs.  Dairy Milk. Soy milk. Ricotta cheese. Low-sodium or reduced-sodium cheeses. Yogurt.  Condiments Fresh and dried herbs and spices. Salt-free seasonings. Onion and garlic powders. Low-sodium varieties of mustard and ketchup. Lemon juice.  Fats and Oils Reduced-sodium salad dressings. Unsalted butter.  Other Unsalted popcorn and pretzels.  The items listed above may not be a complete list of recommended foods or beverages. Contact your dietitian for more options. WHAT FOODS ARE NOT RECOMMENDED? Grains Instant hot cereals. Bread stuffing, pancake, and biscuit mixes. Croutons. Seasoned rice or pasta mixes. Noodle soup cups. Boxed or frozen macaroni and cheese. Self-rising flour. Regular salted crackers. Vegetables Regular canned vegetables. Regular canned tomato sauce and paste. Regular tomato and vegetable juices. Frozen vegetables in sauces. Salted french fries. Olives. Angie Fava. Relishes. Sauerkraut. Salsa. Meat and Other Protein Products Salted, canned, smoked, spiced, or pickled meats, seafood, or fish. Bacon, ham, sausage, hot dogs, corned beef, chipped beef, and packaged luncheon meats. Salt pork. Jerky. Pickled herring. Anchovies, regular canned tuna, and sardines. Salted nuts. Dairy Processed cheese and cheese spreads. Cheese curds. Blue cheese and cottage cheese. Buttermilk.  Condiments Onion and garlic salt, seasoned salt, table salt, and sea salt. Canned and packaged gravies. Worcestershire sauce. Tartar  sauce. Barbecue sauce. Teriyaki sauce. Soy sauce, including reduced sodium. Steak sauce. Fish sauce. Oyster sauce. Cocktail sauce. Horseradish. Regular ketchup and mustard. Meat flavorings and tenderizers. Bouillon cubes. Hot sauce. Tabasco sauce. Marinades. Taco seasonings. Relishes. Fats and Oils Regular salad dressings. Salted butter. Margarine. Ghee. Bacon fat.  Other Potato and tortilla chips. Corn chips and puffs. Salted popcorn and pretzels. Canned or dried soups. Pizza. Frozen entrees and pot pies.  The items listed above may not be a complete list of foods and beverages to avoid. Contact your dietitian for more information. Document Released: 02/22/2002 Document Revised: 09/07/2013 Document Reviewed: 07/07/2013 [facility] Patient Information 2015 [place], [place]. This information is not intended to replace advice given to you by your health care provider. Make sure you discuss any questions you have with your health care provider.     Fall Prevention and Home Safety Falls cause injuries and can affect all age groups. It is possible to prevent falls.  HOW TO PREVENT FALLS  Wear shoes with rubber soles that do not have an opening for your toes.  Keep the inside and outside of your house well lit.  Use night  lights throughout your home.  Remove clutter from floors.  Clean up floor spills.  Remove throw rugs or fasten them to the floor with carpet tape.  Do not place electrical cords across pathways.  Put grab bars by your tub, shower, and toilet. Do not use towel bars as grab bars.  Put handrails on both sides of the stairway. Fix loose handrails.  Do not climb on stools or stepladders, if possible.  Do not wax your floors.  Repair uneven or unsafe sidewalks, walkways, or stairs.  Keep items you use a lot within reach.  Be aware of pets.  Keep emergency numbers next to the telephone.  Put smoke detectors in your home and near bedrooms. Ask your doctor what other  things you can do to prevent falls. Document Released: 06/29/2009 Document Revised: 03/03/2012 Document Reviewed: 12/03/2011 [facility] Patient Information 2015 [place], [place]. This information is not intended to replace advice given to you by your health care provider. Make sure you discuss any questions you have with your health care provider.  Glaucoma Glaucoma happens when the fluid pressure in the eyeball is too high. The pressure cannot stay high for too long, or the eyeball may become damaged. Signs of glaucoma include:  Having a hard time seeing in a dark room after being in a bright one.  Having trouble seeing things out to the sides of you.  Blurry sight.  Seeing bright white lights or colors in front of your eyes.  Headaches.  Feeling sick to your stomach (nauseous) or throwing up (vomiting).  Sudden vision loss. Glaucoma testing is an important part of taking care of your eyesight. HOME CARE  Always use your eyedrops or pills as told by your doctor. Do not run out.  Do not go away from home without your eyedrops or pills.  Keep your appointments.  Always tell a new doctor that you have glaucoma and how long you have had it. Tell the doctor about the eyedrops and pills you take.  Do not use eyedrops or pills that have not been prescribed by your doctor. GET HELP RIGHT AWAY IF:  You develop severe pain in the affected eye.  You develop vision problems.  You develop a bad headache in the area around the eye.  You feel sick to your stomach or throw up.  You start to have problems with the other eye. MAKE SURE YOU:   Understand these instructions.  Will watch your condition.  Will get help right away if you are not doing well or get worse. Document Released: 06/11/2008 Document Revised: 11/25/2011 Document Reviewed: 06/11/2008 [facility] Patient Information 2015 [place], [place]. This information is not intended to replace advice given to you by your health care  provider. Make sure you discuss any questions you have with your health care provider.  Health Maintenance A healthy lifestyle and preventative care can promote health and wellness.  Maintain regular health, dental, and eye exams.  Eat a healthy diet. Foods like vegetables, fruits, whole grains, low-fat dairy products, and lean protein foods contain the nutrients you need and are low in calories. Decrease your intake of foods high in solid fats, added sugars, and salt. Get information about a proper diet from your health care provider, if necessary.  Regular physical exercise is one of the most important things you can do for your health. Most adults should get at least 150 minutes of moderate-intensity exercise (any activity that increases your heart rate and causes you to sweat) each week.  In addition, most adults need muscle-strengthening exercises on 2 or more days a week.   Maintain a healthy weight. The body mass index (BMI) is a screening tool to identify possible weight problems. It provides an estimate of body fat based on height and weight. Your health care provider can find your BMI and can help you achieve or maintain a healthy weight. For males 20 years and older:  A BMI below 18.5 is considered underweight.  A BMI of 18.5 to 24.9 is normal.  A BMI of 25 to 29.9 is considered overweight.  A BMI of 30 and above is considered obese.  Maintain normal blood lipids and cholesterol by exercising and minimizing your intake of saturated fat. Eat a balanced diet with plenty of fruits and vegetables. Blood tests for lipids and cholesterol should begin at age 41 and be repeated every 5 years. If your lipid or cholesterol levels are high, you are over age 86, or you are at high risk for heart disease, you may need your cholesterol levels checked more frequently.Ongoing high lipid and cholesterol levels should be treated with medicines if diet and exercise are not working.  If you smoke,  find out from your health care provider how to quit. If you do not use tobacco, do not start.  Lung cancer screening is recommended for adults aged 77-80 years who are at high risk for developing lung cancer because of a history of smoking. A yearly low-dose CT scan of the lungs is recommended for people who have at least a 30-pack-year history of smoking and are current smokers or have quit within the past 15 years. A pack year of smoking is smoking an average of 1 pack of cigarettes a day for 1 year (for example, a 30-pack-year history of smoking could mean smoking 1 pack a day for 30 years or 2 packs a day for 15 years). Yearly screening should continue until the smoker has stopped smoking for at least 15 years. Yearly screening should be stopped for people who develop a health problem that would prevent them from having lung cancer treatment.  If you choose to drink alcohol, do not have more than 2 drinks per day. One drink is considered to be 12 oz (360 mL) of beer, 5 oz (150 mL) of wine, or 1.5 oz (45 mL) of liquor.  Avoid the use of street drugs. Do not share needles with anyone. Ask for help if you need support or instructions about stopping the use of drugs.  High blood pressure causes heart disease and increases the risk of stroke. Blood pressure should be checked at least every 1-2 years. Ongoing high blood pressure should be treated with medicines if weight loss and exercise are not effective.  If you are 15-35 years old, ask your health care provider if you should take aspirin to prevent heart disease.  Diabetes screening involves taking a blood sample to check your fasting blood sugar level. This should be done once every 3 years after age 31 if you are at a normal weight and without risk factors for diabetes. Testing should be considered at a younger age or be carried out more frequently if you are overweight and have at least 1 risk factor for diabetes.  Colorectal cancer can be  detected and often prevented. Most routine colorectal cancer screening begins at the age of 61 and continues through age 43. However, your health care provider may recommend screening at an earlier age if you have risk factors  for colon cancer. On a yearly basis, your health care provider may provide home test kits to check for hidden blood in the stool. A small camera at the end of a tube may be used to directly examine the colon (sigmoidoscopy or colonoscopy) to detect the earliest forms of colorectal cancer. Talk to your health care provider about this at age 43 when routine screening begins. A direct exam of the colon should be repeated every 5-10 years through age 63, unless early forms of precancerous polyps or small growths are found.  People who are at an increased risk for hepatitis B should be screened for this virus. You are considered at high risk for hepatitis B if:  You were born in a country where hepatitis B occurs often. Talk with your health care provider about which countries are considered high risk.  Your parents were born in a high-risk country and you have not received a shot to protect against hepatitis B (hepatitis B vaccine).  You have HIV or AIDS.  You use needles to inject street drugs.  You live with, or have sex with, someone who has hepatitis B.  You are a man who has sex with other men (MSM).  You get hemodialysis treatment.  You take certain medicines for conditions like cancer, organ transplantation, and autoimmune conditions.  Hepatitis C blood testing is recommended for all people born from [birth year] through 1967 and any individual with known risk factors for hepatitis C.  Healthy men should no longer receive prostate-specific antigen (PSA) blood tests as part of routine cancer screening. Talk to your health care provider about prostate cancer screening.  Testicular cancer screening is not recommended for adolescents or adult males who have no symptoms.  Screening includes self-exam, a health care provider exam, and other screening tests. Consult with your health care provider about any symptoms you have or any concerns you have about testicular cancer.  Practice safe sex. Use condoms and avoid high-risk sexual practices to reduce the spread of sexually transmitted infections (STIs).  You should be screened for STIs, including gonorrhea and chlamydia if:  You are sexually active and are younger than 24 years.  You are older than 24 years, and your health care provider tells you that you are at risk for this type of infection.  Your sexual activity has changed since you were last screened, and you are at an increased risk for chlamydia or gonorrhea. Ask your health care provider if you are at risk.  If you are at risk of being infected with HIV, it is recommended that you take a prescription medicine daily to prevent HIV infection. This is called pre-exposure prophylaxis (PrEP). You are considered at risk if:  You are a man who has sex with other men (MSM).  You are a heterosexual man who is sexually active with multiple partners.  You take drugs by injection.  You are sexually active with a partner who has HIV.  Talk with your health care provider about whether you are at high risk of being infected with HIV. If you choose to begin PrEP, you should first be tested for HIV. You should then be tested every 3 months for as long as you are taking PrEP.  Use sunscreen. Apply sunscreen liberally and repeatedly throughout the day. You should seek shade when your shadow is shorter than you. Protect yourself by wearing long sleeves, pants, a wide-brimmed hat, and sunglasses year round whenever you are outdoors.  Tell your health care provider  of new moles or changes in moles, especially if there is a change in shape or color. Also, tell your health care provider if a mole is larger than the size of a pencil eraser.  A one-time screening for  abdominal aortic aneurysm (AAA) and surgical repair of large AAAs by ultrasound is recommended for men aged 45-75 years who are current or former smokers.  Stay current with your vaccines (immunizations). Document Released: 02/29/2008 Document Revised: 09/07/2013 Document Reviewed: 01/28/2011 [facility] Patient Information 2015 [place], [place]. This information is not intended to replace advice given to you by your health care provider. Make sure you discuss any questions you have with your health care provider.

## 2015-05-08 ENCOUNTER — Other Ambulatory Visit: Payer: Self-pay

## 2015-05-18 ENCOUNTER — Ambulatory Visit (INDEPENDENT_AMBULATORY_CARE_PROVIDER_SITE_OTHER): Payer: PRIVATE HEALTH INSURANCE

## 2015-05-18 VITALS — BP 136/90

## 2015-05-18 DIAGNOSIS — Z23 Encounter for immunization: Secondary | ICD-10-CM | POA: Diagnosis not present

## 2015-05-18 NOTE — Progress Notes (Signed)
Patient came back in today for bp check which was 136/ 90 and is taking BP med now;

## 2015-08-29 ENCOUNTER — Encounter: Payer: Self-pay | Admitting: Gastroenterology

## 2015-08-29 ENCOUNTER — Telehealth: Payer: Self-pay | Admitting: Internal Medicine

## 2015-08-29 NOTE — Telephone Encounter (Signed)
Patient is requesting script for cialis to be sent to [facility] on [address]

## 2015-08-29 NOTE — Telephone Encounter (Signed)
Last prescribed in 2014, last OV 02/16. Please advise pt to make OV for evaluation and treatment, thanks

## 2015-08-30 NOTE — Telephone Encounter (Signed)
Left vm for patient to set up appointment.

## 2015-08-31 ENCOUNTER — Inpatient Hospital Stay (HOSPITAL_COMMUNITY)
Admission: EM | Admit: 2015-08-31 | Discharge: 2015-09-06 | DRG: 286 | Disposition: A | Discharge status: Home / Self Care | Payer: 59 | Attending: Internal Medicine | Admitting: Internal Medicine

## 2015-08-31 ENCOUNTER — Emergency Department (INDEPENDENT_AMBULATORY_CARE_PROVIDER_SITE_OTHER)
Admission: EM | Admit: 2015-08-31 | Discharge: 2015-08-31 | Disposition: A | Discharge status: Other Acute Inpatient Hospital | Payer: PRIVATE HEALTH INSURANCE | Source: Home / Self Care | Attending: Family Medicine | Admitting: Family Medicine

## 2015-08-31 ENCOUNTER — Emergency Department (HOSPITAL_COMMUNITY): Payer: 59

## 2015-08-31 ENCOUNTER — Encounter (HOSPITAL_COMMUNITY): Payer: Self-pay | Admitting: *Deleted

## 2015-08-31 ENCOUNTER — Encounter (HOSPITAL_COMMUNITY): Payer: Self-pay | Admitting: Emergency Medicine

## 2015-08-31 DIAGNOSIS — I471 Supraventricular tachycardia: Secondary | ICD-10-CM | POA: Diagnosis present

## 2015-08-31 DIAGNOSIS — J189 Pneumonia, unspecified organism: Secondary | ICD-10-CM | POA: Diagnosis not present

## 2015-08-31 DIAGNOSIS — I10 Essential (primary) hypertension: Secondary | ICD-10-CM | POA: Diagnosis not present

## 2015-08-31 DIAGNOSIS — Z79899 Other long term (current) drug therapy: Secondary | ICD-10-CM

## 2015-08-31 DIAGNOSIS — D75839 Thrombocytosis, unspecified: Secondary | ICD-10-CM | POA: Diagnosis present

## 2015-08-31 DIAGNOSIS — I248 Other forms of acute ischemic heart disease: Secondary | ICD-10-CM | POA: Diagnosis present

## 2015-08-31 DIAGNOSIS — I130 Hypertensive heart and chronic kidney disease with heart failure and stage 1 through stage 4 chronic kidney disease, or unspecified chronic kidney disease: Secondary | ICD-10-CM | POA: Diagnosis present

## 2015-08-31 DIAGNOSIS — R079 Chest pain, unspecified: Secondary | ICD-10-CM

## 2015-08-31 DIAGNOSIS — I129 Hypertensive chronic kidney disease with stage 1 through stage 4 chronic kidney disease, or unspecified chronic kidney disease: Secondary | ICD-10-CM | POA: Diagnosis present

## 2015-08-31 DIAGNOSIS — I517 Cardiomegaly: Secondary | ICD-10-CM | POA: Diagnosis not present

## 2015-08-31 DIAGNOSIS — I428 Other cardiomyopathies: Secondary | ICD-10-CM | POA: Insufficient documentation

## 2015-08-31 DIAGNOSIS — R000 Tachycardia, unspecified: Secondary | ICD-10-CM | POA: Diagnosis not present

## 2015-08-31 DIAGNOSIS — D751 Secondary polycythemia: Secondary | ICD-10-CM | POA: Diagnosis not present

## 2015-08-31 DIAGNOSIS — R748 Abnormal levels of other serum enzymes: Secondary | ICD-10-CM | POA: Diagnosis present

## 2015-08-31 DIAGNOSIS — E785 Hyperlipidemia, unspecified: Secondary | ICD-10-CM | POA: Diagnosis present

## 2015-08-31 DIAGNOSIS — I5021 Acute systolic (congestive) heart failure: Secondary | ICD-10-CM | POA: Insufficient documentation

## 2015-08-31 DIAGNOSIS — I5041 Acute combined systolic (congestive) and diastolic (congestive) heart failure: Secondary | ICD-10-CM | POA: Diagnosis not present

## 2015-08-31 DIAGNOSIS — J209 Acute bronchitis, unspecified: Secondary | ICD-10-CM | POA: Diagnosis present

## 2015-08-31 DIAGNOSIS — K219 Gastro-esophageal reflux disease without esophagitis: Secondary | ICD-10-CM | POA: Diagnosis present

## 2015-08-31 DIAGNOSIS — Z87891 Personal history of nicotine dependence: Secondary | ICD-10-CM | POA: Diagnosis not present

## 2015-08-31 DIAGNOSIS — T380X5A Adverse effect of glucocorticoids and synthetic analogues, initial encounter: Secondary | ICD-10-CM | POA: Diagnosis not present

## 2015-08-31 DIAGNOSIS — D473 Essential (hemorrhagic) thrombocythemia: Secondary | ICD-10-CM | POA: Diagnosis present

## 2015-08-31 DIAGNOSIS — I429 Cardiomyopathy, unspecified: Secondary | ICD-10-CM | POA: Diagnosis present

## 2015-08-31 DIAGNOSIS — Z7982 Long term (current) use of aspirin: Secondary | ICD-10-CM | POA: Diagnosis not present

## 2015-08-31 DIAGNOSIS — N189 Chronic kidney disease, unspecified: Secondary | ICD-10-CM | POA: Diagnosis present

## 2015-08-31 DIAGNOSIS — J811 Chronic pulmonary edema: Secondary | ICD-10-CM | POA: Diagnosis not present

## 2015-08-31 DIAGNOSIS — Z8249 Family history of ischemic heart disease and other diseases of the circulatory system: Secondary | ICD-10-CM | POA: Diagnosis not present

## 2015-08-31 DIAGNOSIS — R0602 Shortness of breath: Secondary | ICD-10-CM | POA: Diagnosis not present

## 2015-08-31 DIAGNOSIS — I209 Angina pectoris, unspecified: Secondary | ICD-10-CM

## 2015-08-31 DIAGNOSIS — J440 Chronic obstructive pulmonary disease with acute lower respiratory infection: Secondary | ICD-10-CM | POA: Diagnosis not present

## 2015-08-31 DIAGNOSIS — R0603 Acute respiratory distress: Secondary | ICD-10-CM | POA: Insufficient documentation

## 2015-08-31 DIAGNOSIS — R7989 Other specified abnormal findings of blood chemistry: Secondary | ICD-10-CM | POA: Diagnosis not present

## 2015-08-31 DIAGNOSIS — I509 Heart failure, unspecified: Secondary | ICD-10-CM | POA: Diagnosis not present

## 2015-08-31 DIAGNOSIS — J9801 Acute bronchospasm: Secondary | ICD-10-CM

## 2015-08-31 DIAGNOSIS — I110 Hypertensive heart disease with heart failure: Secondary | ICD-10-CM | POA: Diagnosis not present

## 2015-08-31 LAB — CBC
HCT: 59.4 % — ABNORMAL HIGH (ref 39.0–52.0)
HEMOGLOBIN: 18.9 g/dL — AB (ref 13.0–17.0)
MCH: 27.4 pg (ref 26.0–34.0)
MCHC: 31.8 g/dL (ref 30.0–36.0)
MCV: 86.2 fL (ref 78.0–100.0)
Platelets: 797 10*3/uL — ABNORMAL HIGH (ref 150–400)
RBC: 6.89 MIL/uL — AB (ref 4.22–5.81)
RDW: 17.4 % — ABNORMAL HIGH (ref 11.5–15.5)
WBC: 16.3 10*3/uL — ABNORMAL HIGH (ref 4.0–10.5)

## 2015-08-31 LAB — BASIC METABOLIC PANEL
ANION GAP: 8 (ref 5–15)
BUN: 15 mg/dL (ref 6–20)
CALCIUM: 9.3 mg/dL (ref 8.9–10.3)
CO2: 25 mmol/L (ref 22–32)
Chloride: 105 mmol/L (ref 101–111)
Creatinine, Ser: 1.21 mg/dL (ref 0.61–1.24)
GFR, EST NON AFRICAN AMERICAN: 58 mL/min — AB (ref >60)
Glucose, Bld: 103 mg/dL — ABNORMAL HIGH (ref 65–99)
Potassium: 4.5 mmol/L (ref 3.5–5.1)
SODIUM: 138 mmol/L (ref 135–145)

## 2015-08-31 LAB — D-DIMER, QUANTITATIVE: D-Dimer, Quant: 0.56 ug/mL-FEU — ABNORMAL HIGH (ref 0.00–0.50)

## 2015-08-31 LAB — I-STAT TROPONIN, ED: TROPONIN I, POC: 0 ng/mL (ref 0.00–0.08)

## 2015-08-31 LAB — BRAIN NATRIURETIC PEPTIDE: B NATRIURETIC PEPTIDE 5: 705.8 pg/mL — AB (ref 0.0–100.0)

## 2015-08-31 IMAGING — DX DG CHEST 2V
2 series · 2 of 2 positions shown · non-contrast
Comparison: None.

CLINICAL DATA: SOB. Pt c/o SOB with exertion x 1 day. No other
chest complaints. Pt currently on 2 liters of oxygen that he states
helps. Hx ex-smoker as of 45 years ago and HTN controlled with
medication.

EXAM:
CHEST  2 VIEW

[w chest pa]
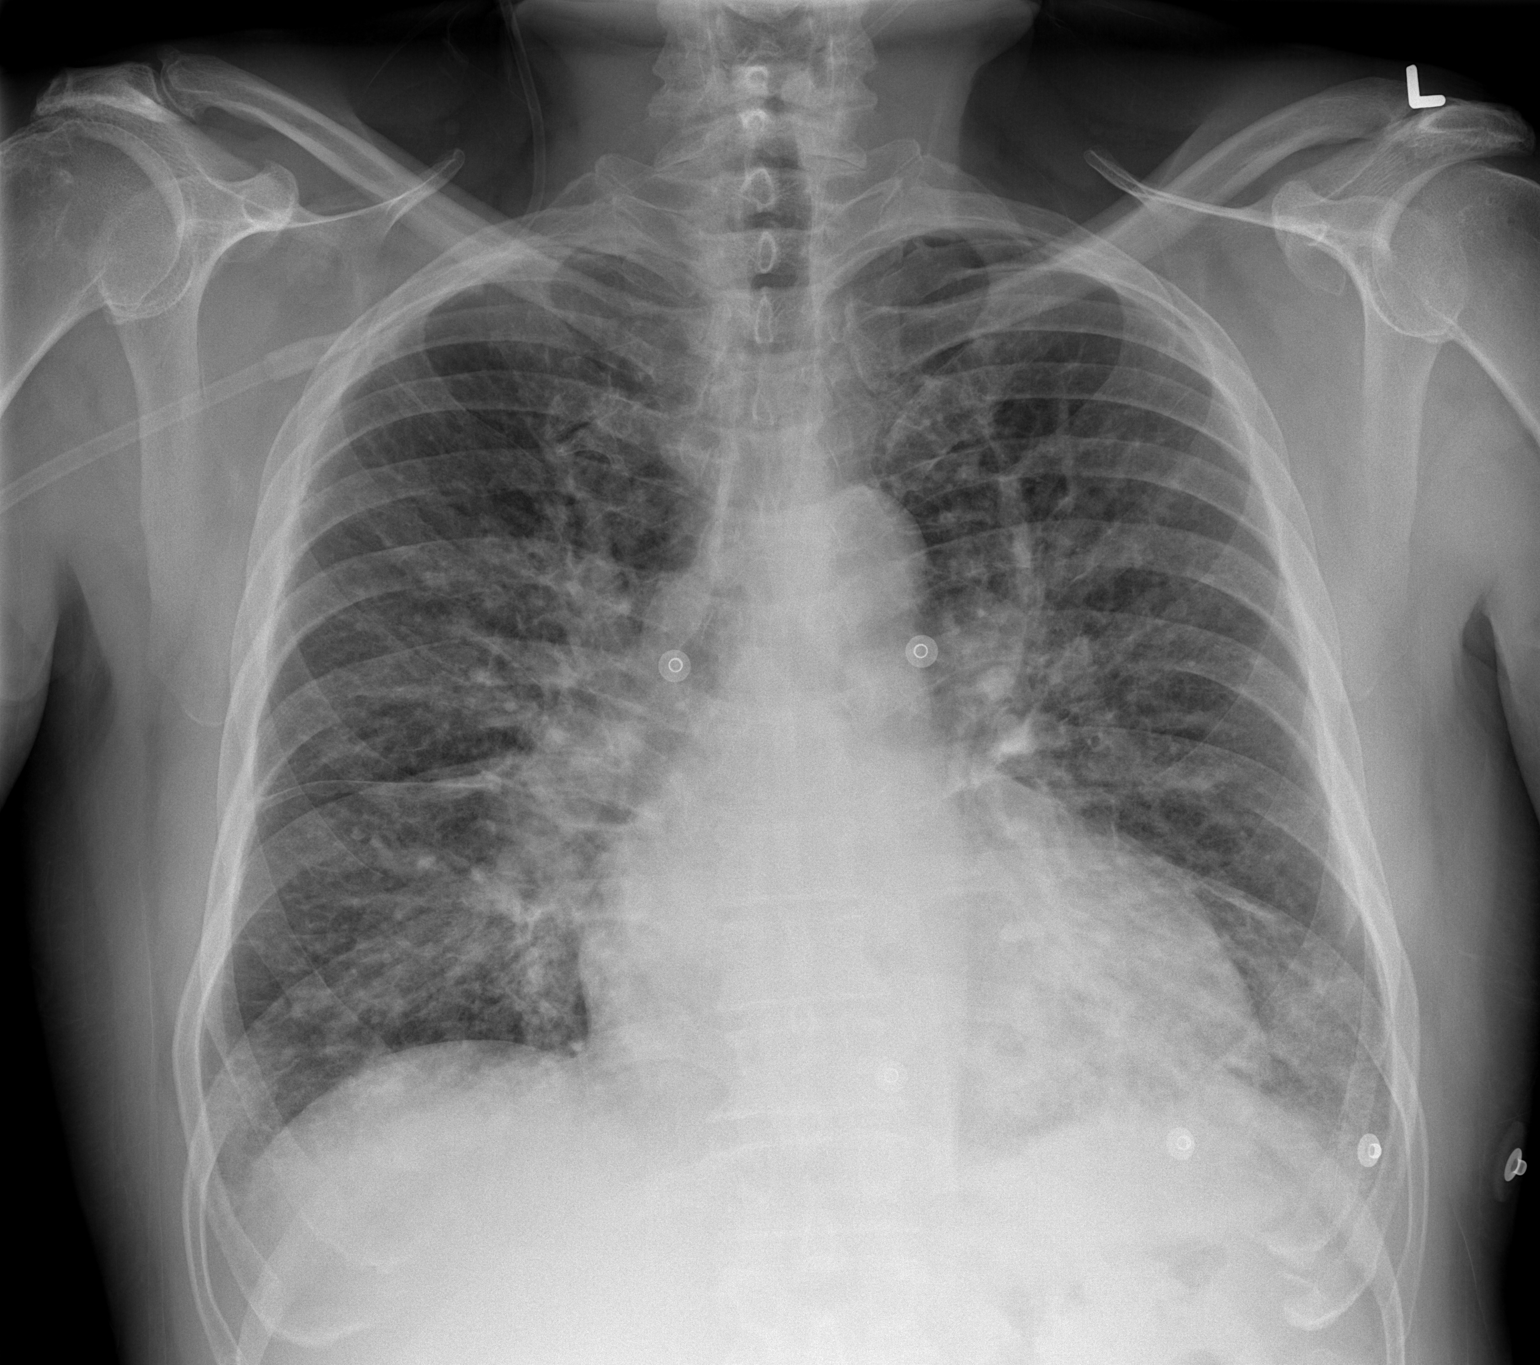

[w chest lat]
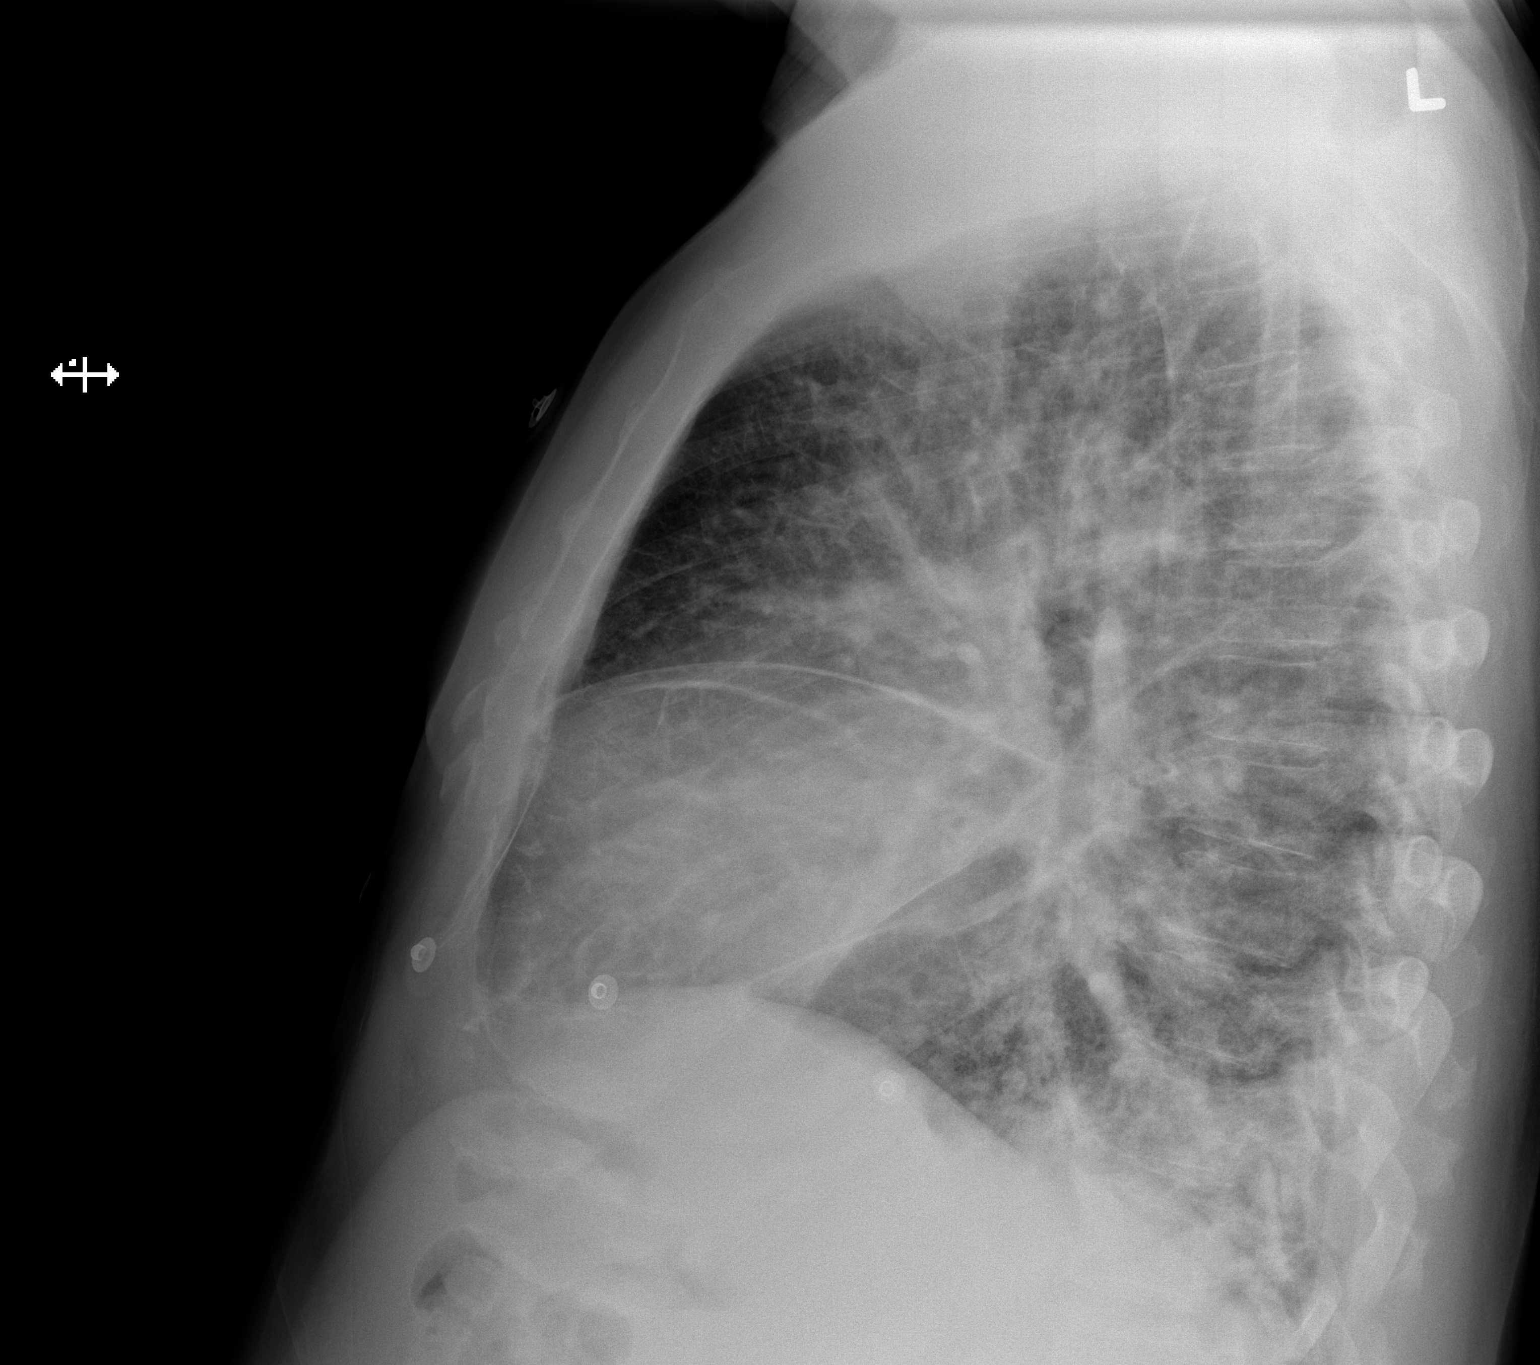

[2 of 2 positions shown; findings below may reference images not displayed]

FINDINGS: Midline trachea. Mild cardiomegaly. Mediastinal contours otherwise
within normal limits. No pleural effusion or pneumothorax.
Thickening of the fissures. Pulmonary interstitial prominence and
indistinctness. Retrocardiac left lower lobe patchy airspace
disease.
IMPRESSION: Mild cardiomegaly with interstitial edema.

Patchy left base airspace disease is suspicious for concurrent
infection. Followup PA and lateral chest X-ray is recommended in 3-4
weeks following trial of antibiotic therapy to ensure resolution and
exclude underlying malignancy.

## 2015-08-31 IMAGING — CT CT ANGIO CHEST
1 of 8 series · 17 of 36 positions shown · IV contrast (Iohexol (Omnipaque 350))
Comparison: Chest radiograph 08/31/2015

CLINICAL DATA: Shortness of breath when lying flat. Elevated
D-dimer.

EXAM:
CT ANGIOGRAPHY CHEST WITH CONTRAST
TECHNIQUE: Multidetector CT imaging of the chest was performed using the
standard protocol during bolus administration of intravenous
contrast. Multiplanar CT image reconstructions and MIPs were
obtained to evaluate the vascular anatomy.
CONTRAST:  90mL OMNIPAQUE IOHEXOL 350 MG/ML SOLN

[Series 406: thins pacs · axial · 0.70mm/px · z∈[+47,+318]mm · 17 of 305 slices shown]
[im 17/305  lung]
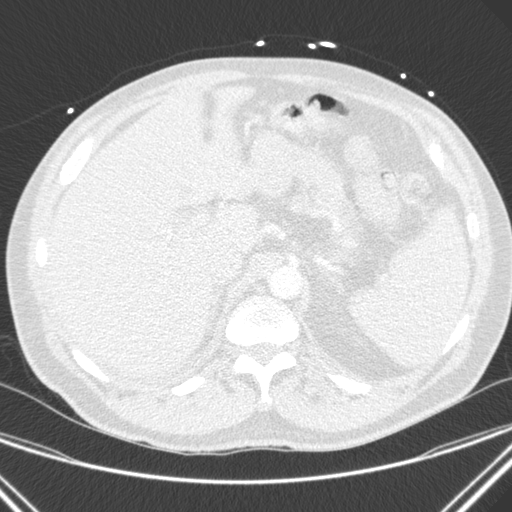
[im 34/305  mediastinal]
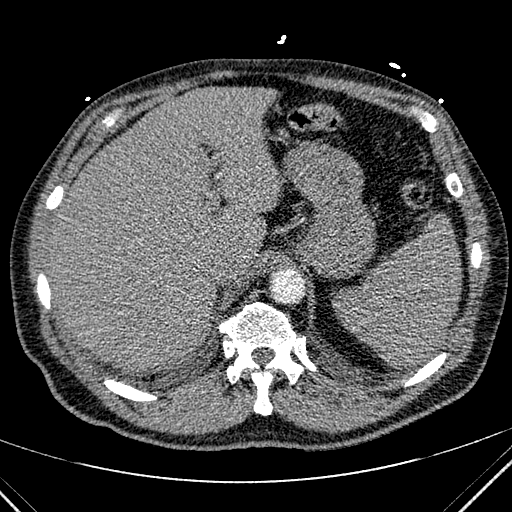
[im 51/305  lung]
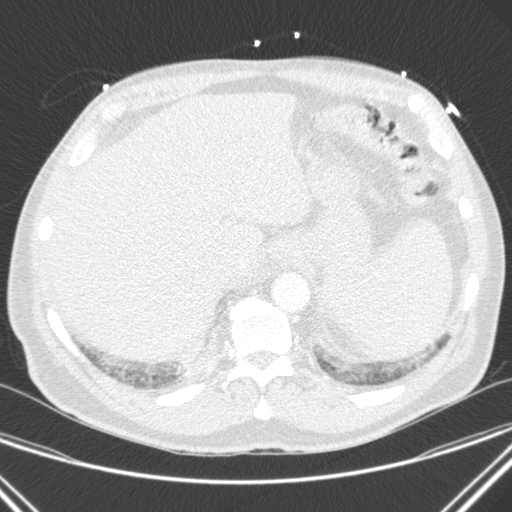
[im 68/305  mediastinal]
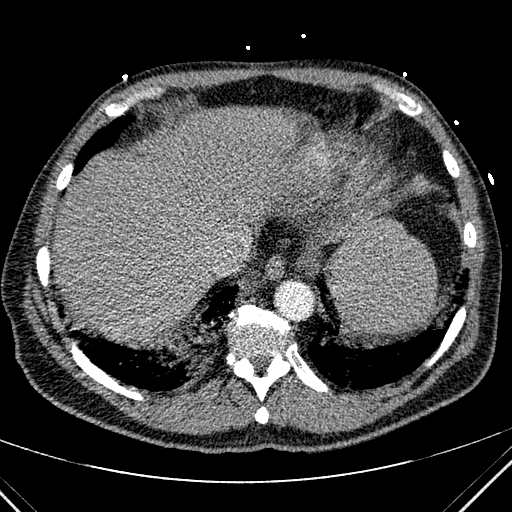
[im 85/305  lung]
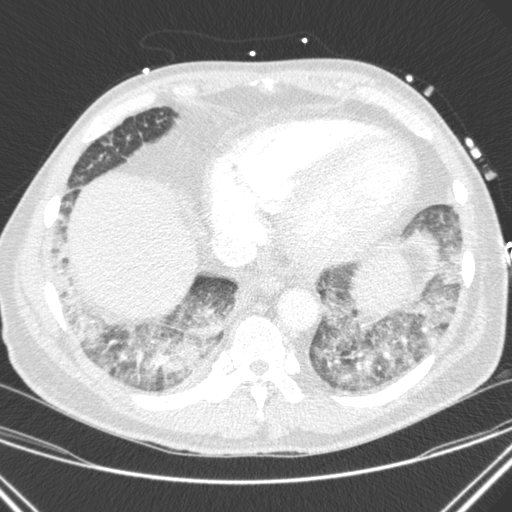
[im 102/305  mediastinal]
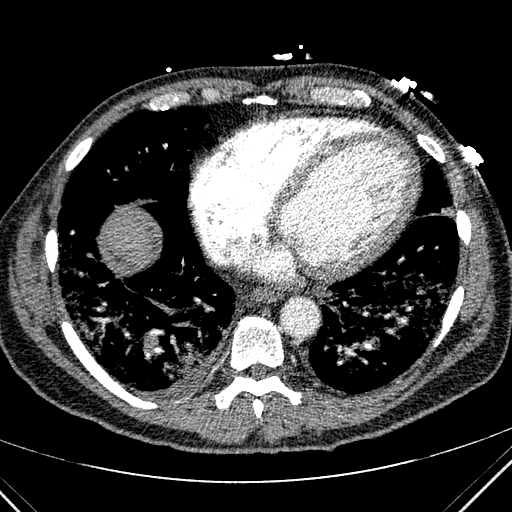
[im 119/305  lung]
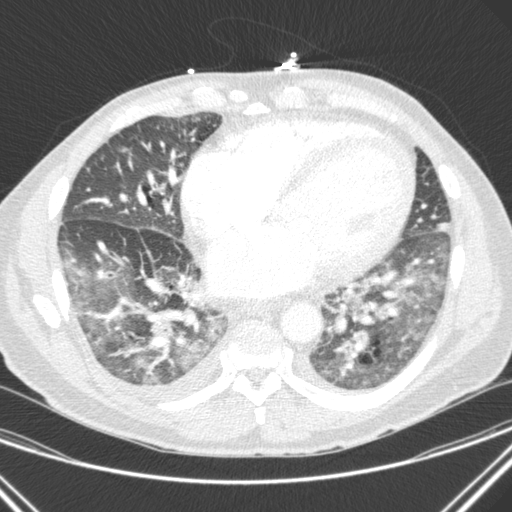
[im 136/305  mediastinal]
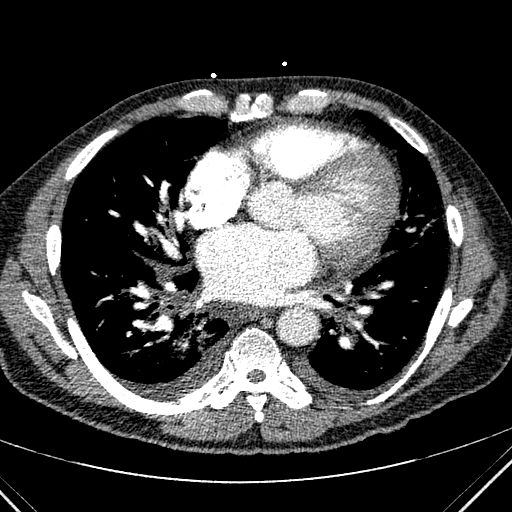
[im 153/305  lung]
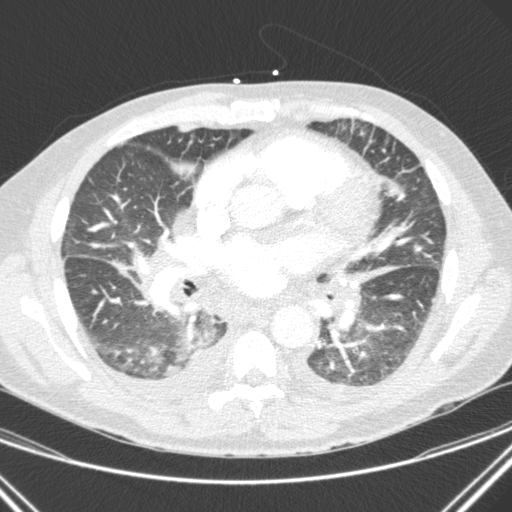
[im 169/305  mediastinal]
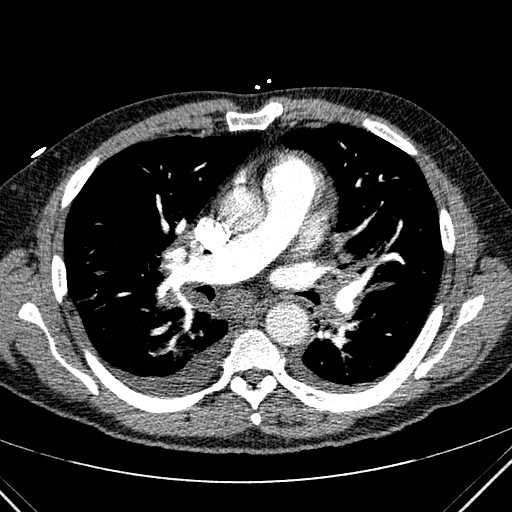
[im 186/305  lung]
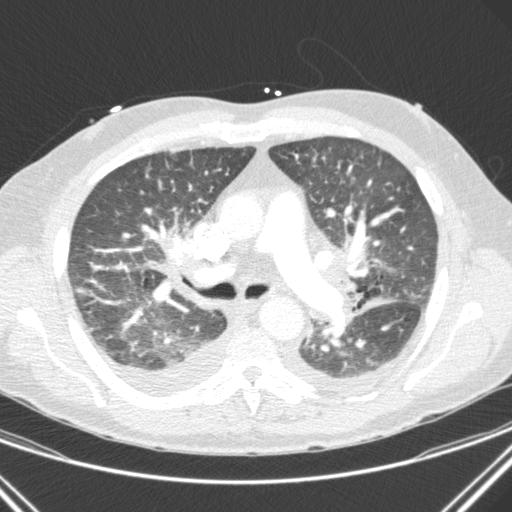
[im 203/305  mediastinal]
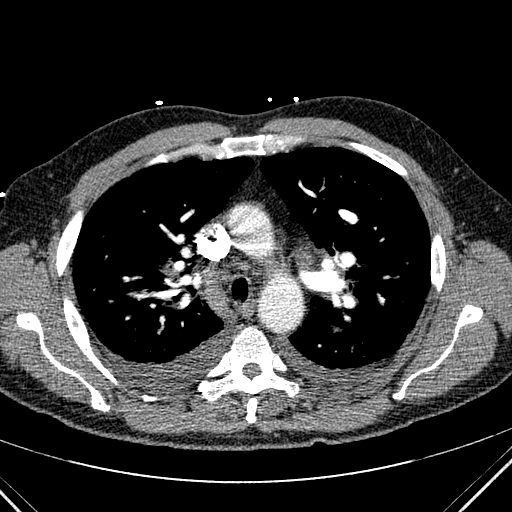
[im 220/305  lung]
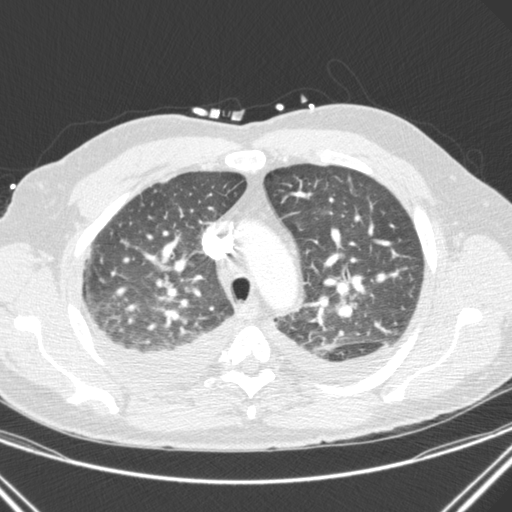
[im 237/305  mediastinal]
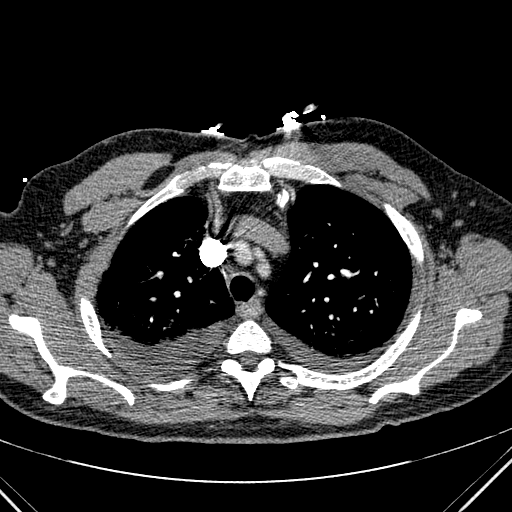
[im 254/305  lung]
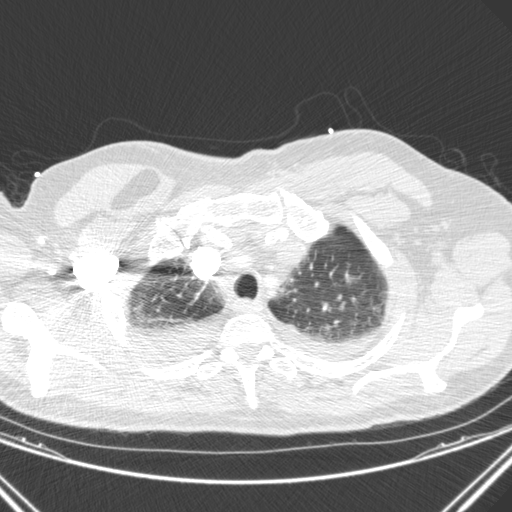
[im 271/305  mediastinal]
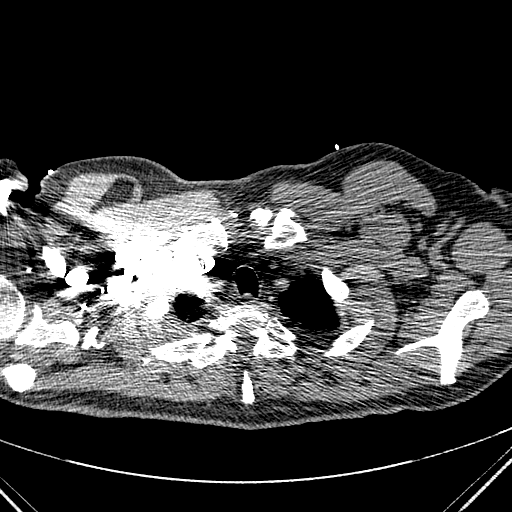
[im 288/305  lung]
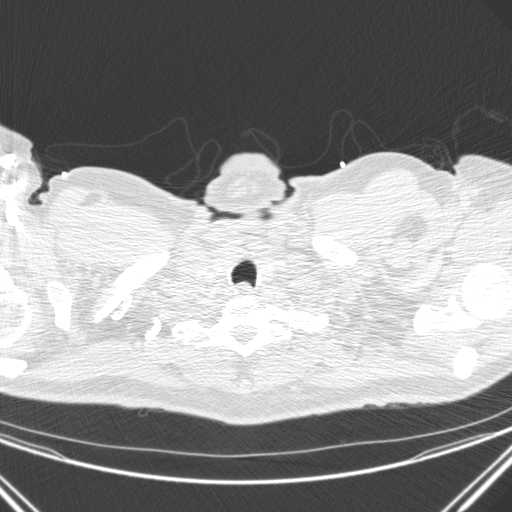

[17 of 36 positions shown; findings below may reference images not displayed]

FINDINGS: Technically adequate study with good opacification of the central
and segmental pulmonary arteries. No focal filling defects
demonstrated. No evidence of significant pulmonary embolus.

Diffuse cardiac enlargement. Normal caliber thoracic aorta. Lymph
nodes demonstrated throughout the mediastinum and hilar regions are
not pathologically enlarged and are likely reactive. Esophagus is
decompressed.

Evaluation of lungs is limited due to respiratory motion artifact.
There are small bilateral pleural effusions. There is diffuse
interstitial infiltration with airspace infiltration predominantly
in the lung bases. This is likely due to edema although superimposed
pneumonia is not excluded. Airways thickening suggesting bronchitis.
No pneumothorax.

Included portions of the upper abdominal organs are grossly
unremarkable. Incidental note of a lipoma in the right anterior
chest wall. Degenerative changes in the spine.

Review of the MIP images confirms the above findings.
IMPRESSION: No evidence of significant pulmonary embolus. Cardiac enlargement
with vascular congestion and diffuse interstitial edema. Airspace
edema versus pneumonia in the lung bases. Bilateral pleural
effusions. Peribronchial thickening suggesting bronchitis.

## 2015-08-31 MED ORDER — ASPIRIN 81 MG PO CHEW
324.0000 mg | CHEWABLE_TABLET | Freq: Once | ORAL | Status: AC
  Administered 2015-08-31: 324 mg via ORAL

## 2015-08-31 MED ORDER — DEXTROSE 5 % IV SOLN
500.0000 mg | Freq: Once | INTRAVENOUS | Status: AC
  Administered 2015-08-31: 500 mg via INTRAVENOUS
  Filled 2015-08-31: qty 500

## 2015-08-31 MED ORDER — METHYLPREDNISOLONE SODIUM SUCC 125 MG IJ SOLR
125.0000 mg | Freq: Once | INTRAMUSCULAR | Status: AC
  Administered 2015-08-31: 125 mg via INTRAVENOUS
  Filled 2015-08-31: qty 2

## 2015-08-31 MED ORDER — ASPIRIN 81 MG PO CHEW
CHEWABLE_TABLET | ORAL | Status: AC
  Filled 2015-08-31: qty 4

## 2015-08-31 MED ORDER — ALBUTEROL SULFATE (2.5 MG/3ML) 0.083% IN NEBU
5.0000 mg | INHALATION_SOLUTION | Freq: Once | RESPIRATORY_TRACT | Status: AC
  Administered 2015-08-31: 5 mg via RESPIRATORY_TRACT
  Filled 2015-08-31: qty 6

## 2015-08-31 MED ORDER — NITROGLYCERIN 0.4 MG SL SUBL
0.4000 mg | SUBLINGUAL_TABLET | SUBLINGUAL | Status: DC | PRN
  Administered 2015-08-31: 0.4 mg via SUBLINGUAL

## 2015-08-31 MED ORDER — DEXTROSE 5 % IV SOLN
1.0000 g | Freq: Once | INTRAVENOUS | Status: AC
  Administered 2015-08-31: 1 g via INTRAVENOUS
  Filled 2015-08-31: qty 10

## 2015-08-31 MED ORDER — IPRATROPIUM BROMIDE 0.02 % IN SOLN
0.5000 mg | Freq: Once | RESPIRATORY_TRACT | Status: AC
  Administered 2015-08-31: 0.5 mg via RESPIRATORY_TRACT
  Filled 2015-08-31: qty 2.5

## 2015-08-31 MED ORDER — IOHEXOL 350 MG/ML SOLN
100.0000 mL | Freq: Once | INTRAVENOUS | Status: AC | PRN
  Administered 2015-08-31: 90 mL via INTRAVENOUS

## 2015-08-31 MED ORDER — FUROSEMIDE 10 MG/ML IJ SOLN
40.0000 mg | Freq: Once | INTRAMUSCULAR | Status: AC
  Administered 2015-08-31: 40 mg via INTRAVENOUS
  Filled 2015-08-31: qty 4

## 2015-08-31 MED ORDER — SODIUM CHLORIDE 0.9 % IV SOLN: Freq: Once | INTRAVENOUS | Status: DC

## 2015-08-31 MED ORDER — NITROGLYCERIN 0.4 MG SL SUBL
SUBLINGUAL_TABLET | SUBLINGUAL | Status: AC
  Filled 2015-08-31: qty 1

## 2015-08-31 NOTE — ED Provider Notes (Signed)
CSN: [id number]     Arrival date & time 08/31/15  1734 History   First MD Initiated Contact with Patient 08/31/15 1750     Chief Complaint  Patient presents with  . Chest Pain   (Consider location/radiation/quality/duration/timing/severity/associated sxs/prior Treatment) Patient is a 69 y.o. male presenting with chest pain. The history is provided by the patient.  Chest Pain Pain location:  Substernal area (pt primary concern is DOE and SOB wiith activity which is new for him, no prior cardiac hx.) Pain radiates to:  Does not radiate Pain radiates to the back: no   Pain severity:  Mild (4/10) Onset quality:  Sudden Duration:  2 days Progression:  Worsening Chronicity:  New Relieved by:  None tried Worsened by:  Nothing tried Ineffective treatments:  None tried Associated symptoms: lower extremity edema and shortness of breath   Associated symptoms: no nausea, no palpitations and not vomiting   Risk factors: high cholesterol, hypertension and male sex   Risk factors: no coronary artery disease and no smoking     Past Medical History  Diagnosis Date  . ERECTILE DYSFUNCTION 11/15/2008  . FATIGUE 11/15/2008  . GERD 11/15/2008  . HYPERLIPIDEMIA 11/15/2008  . LEG PAIN, LEFT 11/05/2010  . MIGRAINE, COMMON 08/06/2010  . Nocturia 12/19/2008  . Swelling of limb 11/05/2010  . THROMBOCYTOSIS 12/19/2008    myleoproliferative disorder  . Impaired glucose tolerance 04/18/2011  . Bladder neck obstruction 04/18/2011  . Polycythemia 04/27/2012  . LVH (left ventricular hypertrophy) 04/27/2012  . Essential hypertension, benign 04/28/2013  . History of adenomatous polyp of colon 05/06/2014    2010  . Arthritis     right shoulder   Past Surgical History  Procedure Laterality Date  . Appendectomy    . Tonsillectomy    . S/p esophageal dilation    . Upper gastrointestinal endoscopy    . Colonoscopy    . Polypectomy    . Circumcision      30+yrs ago   Family History  Problem Relation Age of Onset  .  Hypertension Mother   . Cancer Father     prostate cancer  . Prostate cancer Father   . Hypertension Sister   . Hypertension Brother   . Colon cancer Neg Hx   . Rectal cancer Neg Hx   . Stomach cancer Neg Hx   . Esophageal cancer Neg Hx   . Pancreatic cancer Neg Hx    Social History  Substance Use Topics  . Smoking status: Former Smoker -- 0.50 packs/day for 12 years    Types: Cigarettes    Quit date: 09/16/1970  . Smokeless tobacco: Never Used     Comment: hasn't smoked in 46 years;   . Alcohol Use: 0.0 oz/week    0 Standard drinks or equivalent per week     Comment: occasional;     Review of Systems  Constitutional: Negative.   Respiratory: Positive for shortness of breath.   Cardiovascular: Positive for chest pain and leg swelling. Negative for palpitations.  Gastrointestinal: Negative for nausea and vomiting.  All other systems reviewed and are negative.   Allergies  Review of patient's allergies indicates no known allergies.  Home Medications   Prior to Admission medications   Medication Sig Start Date End Date Taking? Authorizing Provider  amLODipine (NORVASC) 5 MG tablet TAKE 1 TABLET (5 MG TOTAL) BY MOUTH DAILY. 11/09/14   Biagio Borg, MD  aspirin 81 MG tablet Take 81 mg by mouth daily.  Historical Provider, MD  hydroxyurea (HYDREA) 500 MG capsule Take 1 capsule (500 mg total) by mouth 2 (two) times daily. May take with food to minimize GI side effects. 11/26/13   Wyatt Portela, MD  lovastatin (MEVACOR) 40 MG tablet TAKE 1 TABLET (40 MG TOTAL) BY MOUTH DAILY. 11/11/14   Biagio Borg, MD  tadalafil (CIALIS) 20 MG tablet Take 1 tablet (20 mg total) by mouth daily as needed for erectile dysfunction. 04/28/13 07/04/14  Biagio Borg, MD  tamsulosin (FLOMAX) 0.4 MG CAPS capsule TAKE 1 CAPSULE (0.4 MG TOTAL) BY MOUTH DAILY. 11/11/14   Biagio Borg, MD   Meds Ordered and Administered this Visit   Medications  0.9 %  sodium chloride infusion (not administered)   nitroGLYCERIN (NITROSTAT) SL tablet 0.4 mg (0.4 mg Sublingual Given 08/31/15 1814)  aspirin chewable tablet 324 mg (324 mg Oral Given 08/31/15 1813)    BP 143/92 mmHg  Pulse 98  Temp(Src) 98.8 F (37.1 C) (Oral)  Resp 20  SpO2 94% No data found.   Physical Exam  Constitutional: He is oriented to person, place, and time. He appears well-developed and well-nourished.  Neck: Normal range of motion. Neck supple.  Cardiovascular: Regular rhythm, normal heart sounds and intact distal pulses.   Pulmonary/Chest: Effort normal and breath sounds normal.  Abdominal: Soft. Bowel sounds are normal.  Musculoskeletal: He exhibits edema.  Neurological: He is alert and oriented to person, place, and time.  Skin: Skin is warm and dry. He is not diaphoretic.  Nursing note and vitals reviewed.   ED Course  Procedures (including critical care time)  Labs Review Labs Reviewed - No data to display  Imaging Review No results found.   Visual Acuity Review  Right Eye Distance:   Left Eye Distance:   Bilateral Distance:    Right Eye Near:   Left Eye Near:    Bilateral Near:     ED ECG REPORT   Date: 08/31/2015  Rate: 93  Rhythm: normal sinus rhythm  QRS Axis: normal  Intervals: normal  ST/T Wave abnormalities: nonspecific T wave changes and ST elevations laterally  Conduction Disutrbances:none  Narrative Interpretation:   Old EKG Reviewed: unchanged  I have personally reviewed the EKG tracing and agree with the computerized printout as noted.     MDM   1. Ischemic chest pain ([place])    Pt reports cp sx relieved with ntg. Discussed with dr Johnsie Cancel, advises transfer to ER for eval.    Billy Fischer, MD 08/31/15 [phone]

## 2015-08-31 NOTE — ED Notes (Signed)
Pt arrives from urgent care reporting SOB and CP beginning yesterday and worsening.  Pt reports dyspnea at rest and worse with any activity.  Pt denies n/v, diaphoresis, dizziness.  Pt denies cough.  Breathing labored, accessory muscle use noted.

## 2015-08-31 NOTE — ED Notes (Signed)
Pt  Reports  Chest    Pain   And  Tightness   With  Shortness  Of  Breath      For  Several  Days       Pt  Reports         Pain    Scale  Is  5            At  This  Time        Pt is  Slightly  Anxious    At this  Time

## 2015-08-31 NOTE — ED Notes (Signed)
Dr. Canary Brim MD at bedside.

## 2015-08-31 NOTE — ED Notes (Signed)
Patient transported to CT 

## 2015-08-31 NOTE — ED Provider Notes (Signed)
CSN: [id number]     Arrival date & time 08/31/15  1844 History   First MD Initiated Contact with Patient 08/31/15 1851     Chief Complaint  Patient presents with  . Shortness of Breath  . Chest Pain     (Consider location/radiation/quality/duration/timing/severity/associated sxs/prior Treatment) HPI  Pt presenting with c/o shortness of breath.  He was seen at urgent care and transferred to the ED due to concern for ischemic chest pain. He did initially have mild chest pain/tightness which he states was resolved with nitroglycerin at urgent care.  Currently his main complaint is shortness of breath- he states symptoms began yesterday and worsening today.  More short of breath with exertion.  Has been coughing up more sputum.  No fever/chills, has felt some generalized weakness.  Feels his feet have been more swollen over the past couple of days.  No diaphoresis.  He received ASA 324mg  prior to arrival via EMS.  He states he stopped smoking 45 years ago.  There are no other associated systemic symptoms, there are no other alleviating or modifying factors.   Past Medical History  Diagnosis Date  . ERECTILE DYSFUNCTION 11/15/2008  . FATIGUE 11/15/2008  . GERD 11/15/2008  . HYPERLIPIDEMIA 11/15/2008  . LEG PAIN, LEFT 11/05/2010  . MIGRAINE, COMMON 08/06/2010  . Nocturia 12/19/2008  . Swelling of limb 11/05/2010  . THROMBOCYTOSIS 12/19/2008    myleoproliferative disorder  . Impaired glucose tolerance 04/18/2011  . Bladder neck obstruction 04/18/2011  . Polycythemia 04/27/2012  . LVH (left ventricular hypertrophy) 04/27/2012  . Essential hypertension, benign 04/28/2013  . History of adenomatous polyp of colon 05/06/2014    2010  . Arthritis     right shoulder   Past Surgical History  Procedure Laterality Date  . Appendectomy    . Tonsillectomy    . S/p esophageal dilation    . Upper gastrointestinal endoscopy    . Colonoscopy    . Polypectomy    . Circumcision      30+yrs ago   Family History   Problem Relation Age of Onset  . Hypertension Mother   . Cancer Father     prostate cancer  . Prostate cancer Father   . Hypertension Sister   . Hypertension Brother   . Colon cancer Neg Hx   . Rectal cancer Neg Hx   . Stomach cancer Neg Hx   . Esophageal cancer Neg Hx   . Pancreatic cancer Neg Hx    Social History  Substance Use Topics  . Smoking status: Former Smoker -- 0.50 packs/day for 12 years    Types: Cigarettes    Quit date: 09/16/1970  . Smokeless tobacco: Never Used     Comment: hasn't smoked in 46 years;   . Alcohol Use: 0.0 oz/week    0 Standard drinks or equivalent per week     Comment: occasional;     Review of Systems  ROS reviewed and all otherwise negative except for mentioned in HPI    Allergies  Review of patient's allergies indicates no known allergies.  Home Medications   Prior to Admission medications   Medication Sig Start Date End Date Taking? Authorizing Provider  amLODipine (NORVASC) 5 MG tablet TAKE 1 TABLET (5 MG TOTAL) BY MOUTH DAILY. 11/09/14  Yes Biagio Borg, MD  aspirin 81 MG tablet Take 81 mg by mouth daily.     Yes Historical Provider, MD  hydroxyurea (HYDREA) 500 MG capsule Take 1 capsule (500 mg total) by mouth  2 (two) times daily. May take with food to minimize GI side effects. 11/26/13  Yes Wyatt Portela, MD  lovastatin (MEVACOR) 40 MG tablet TAKE 1 TABLET (40 MG TOTAL) BY MOUTH DAILY. 11/11/14  Yes Biagio Borg, MD  naproxen sodium (ANAPROX) 220 MG tablet Take 220 mg by mouth daily as needed (general pain).   Yes Historical Provider, MD  tadalafil (CIALIS) 20 MG tablet Take 1 tablet (20 mg total) by mouth daily as needed for erectile dysfunction. 04/28/13 08/31/15 Yes Biagio Borg, MD  tamsulosin (FLOMAX) 0.4 MG CAPS capsule TAKE 1 CAPSULE (0.4 MG TOTAL) BY MOUTH DAILY. 11/11/14  Yes Biagio Borg, MD   BP 132/80 mmHg  Pulse 81  Temp(Src) 97.3 F (36.3 C) (Oral)  Resp 16  Ht 5\' 9"  (1.753 m)  Wt 184 lb 15.5 oz (83.9 kg)  BMI  27.30 kg/m2  SpO2 98%  Vitals reviewed Physical Exam  Physical Examination: General appearance - alert, well appearing, and in no distress Mental status - alert, oriented to person, place, and time Eyes - no conjunctival injection no scleral icterus Mouth - mucous membranes moist, pharynx normal without lesions Chest - BSS, mild expiratory wheezing bilaterally, increased respiratory effort but able to speak in full sentences Heart - normal rate, regular rhythm, normal S1, S2, no murmurs, rubs, clicks or gallops Abdomen - soft, nontender, nondistended, no masses or organomegaly Neurological - alert, oriented, normal speech,  Extremities - peripheral pulses normal, trace symmetric pedal edema, no clubbing or cyanosis Skin - normal coloration and turgor, no rashes  ED Course  Procedures (including critical care time)  CRITICAL CARE Performed by: Threasa Beards Total critical care time: 45 minutes Critical care time was exclusive of separately billable procedures and treating other patients. Critical care was necessary to treat or prevent imminent or life-threatening deterioration. Critical care was time spent personally by me on the following activities: development of treatment plan with patient and/or surrogate as well as nursing, discussions with consultants, evaluation of patient's response to treatment, examination of patient, obtaining history from patient or surrogate, ordering and performing treatments and interventions, ordering and review of laboratory studies, ordering and review of radiographic studies, pulse oximetry and re-evaluation of patient's condition. Labs Review Labs Reviewed  BASIC METABOLIC PANEL - Abnormal; Notable for the following:    Glucose, Bld 103 (*)    GFR calc non Af Amer 58 (*)    All other components within normal limits  CBC - Abnormal; Notable for the following:    WBC 16.3 (*)    RBC 6.89 (*)    Hemoglobin 18.9 (*)    HCT 59.4 (*)    RDW 17.4 (*)     Platelets 797 (*)    All other components within normal limits  D-DIMER, QUANTITATIVE (NOT AT [facility]) - Abnormal; Notable for the following:    D-Dimer, Quant 0.56 (*)    All other components within normal limits  BRAIN NATRIURETIC PEPTIDE - Abnormal; Notable for the following:    B Natriuretic Peptide 705.8 (*)    All other components within normal limits  TROPONIN I - Abnormal; Notable for the following:    Troponin I 0.08 (*)    All other components within normal limits  CBC - Abnormal; Notable for the following:    WBC 19.9 (*)    RBC 6.74 (*)    Hemoglobin 18.5 (*)    HCT 58.0 (*)    RDW 17.8 (*)    Platelets 786 (*)  All other components within normal limits  CBC WITH DIFFERENTIAL/PLATELET - Abnormal; Notable for the following:    WBC 19.1 (*)    RBC 6.95 (*)    Hemoglobin 19.1 (*)    HCT 59.5 (*)    RDW 17.8 (*)    Platelets 855 (*)    Neutro Abs 18.2 (*)    All other components within normal limits  TROPONIN I - Abnormal; Notable for the following:    Troponin I 0.12 (*)    All other components within normal limits  COMPREHENSIVE METABOLIC PANEL - Abnormal; Notable for the following:    Glucose, Bld 186 (*)    Creatinine, Ser 1.32 (*)    Total Protein 6.4 (*)    Total Bilirubin 1.8 (*)    GFR calc non Af Amer 53 (*)    All other components within normal limits  TROPONIN I - Abnormal; Notable for the following:    Troponin I 0.04 (*)    All other components within normal limits  MRSA PCR SCREENING  CULTURE, BLOOD (ROUTINE X 2)  CULTURE, BLOOD (ROUTINE X 2)  CULTURE, EXPECTORATED SPUTUM-ASSESSMENT  GRAM STAIN  MAGNESIUM  PHOSPHORUS  STREP PNEUMONIAE URINARY ANTIGEN  INFLUENZA PANEL BY PCR (TYPE A & B, H1N1)  LEGIONELLA ANTIGEN, URINE  TROPONIN I  Randolm Idol, ED    Imaging Review Dg Chest 2 View  08/31/2015  CLINICAL DATA:  SOB. Pt c/o SOB with exertion x 1 day. No other chest complaints. Pt currently on 2 liters of oxygen that he states helps.  Hx ex-smoker as of 45 years ago and HTN controlled with medication. EXAM: CHEST  2 VIEW COMPARISON:  None. FINDINGS: Midline trachea. Mild cardiomegaly. Mediastinal contours otherwise within normal limits. No pleural effusion or pneumothorax. Thickening of the fissures. Pulmonary interstitial prominence and indistinctness. Retrocardiac left lower lobe patchy airspace disease. IMPRESSION: Mild cardiomegaly with interstitial edema. Patchy left base airspace disease is suspicious for concurrent infection. Followup PA and lateral chest X-ray is recommended in 3-4 weeks following trial of antibiotic therapy to ensure resolution and exclude underlying malignancy. Electronically Signed   By: Abigail Miyamoto M.D.   On: 08/31/2015 19:56   Ct Angio Chest Pe W/cm &/or Wo Cm  08/31/2015  CLINICAL DATA:  Shortness of breath when lying flat. Elevated D-dimer. EXAM: CT ANGIOGRAPHY CHEST WITH CONTRAST TECHNIQUE: Multidetector CT imaging of the chest was performed using the standard protocol during bolus administration of intravenous contrast. Multiplanar CT image reconstructions and MIPs were obtained to evaluate the vascular anatomy. CONTRAST:  75mL OMNIPAQUE IOHEXOL 350 MG/ML SOLN COMPARISON:  Chest radiograph 08/31/2015 FINDINGS: Technically adequate study with good opacification of the central and segmental pulmonary arteries. No focal filling defects demonstrated. No evidence of significant pulmonary embolus. Diffuse cardiac enlargement. Normal caliber thoracic aorta. Lymph nodes demonstrated throughout the mediastinum and hilar regions are not pathologically enlarged and are likely reactive. Esophagus is decompressed. Evaluation of lungs is limited due to respiratory motion artifact. There are small bilateral pleural effusions. There is diffuse interstitial infiltration with airspace infiltration predominantly in the lung bases. This is likely due to edema although superimposed pneumonia is not excluded. Airways thickening  suggesting bronchitis. No pneumothorax. Included portions of the upper abdominal organs are grossly unremarkable. Incidental note of a lipoma in the right anterior chest wall. Degenerative changes in the spine. Review of the MIP images confirms the above findings. IMPRESSION: No evidence of significant pulmonary embolus. Cardiac enlargement with vascular congestion and diffuse interstitial edema. Airspace  edema versus pneumonia in the lung bases. Bilateral pleural effusions. Peribronchial thickening suggesting bronchitis. Electronically Signed   By: Lucienne Capers M.D.   On: 08/31/2015 22:19   I have personally reviewed and evaluated these images and lab results as part of my medical decision-making.   EKG Interpretation   Date/Time:  Thursday August 31 2015 18:52:23 EST Ventricular Rate:  99 PR Interval:  171 QRS Duration: 94 QT Interval:  383 QTC Calculation: 491 R Axis:   2 Text Interpretation:  Sinus rhythm Probable left atrial enlargement Left  ventricular hypertrophy Nonspecific T abnormalities, lateral leads  Borderline prolonged QT interval No significant change since last tracing  earlier today Confirmed by [facility]  MD, Loyce Klasen [id number]) on 08/31/2015  6:59:57 PM      MDM   Final diagnoses:  Bronchospasm  Acute congestive heart failure, unspecified congestive heart failure type (HCC)  Chest pain, unspecified chest pain type  Community acquired pneumonia  Essential hypertension    Pt presenting with shortness of breath, mild chest pain which was relieved with nitroglycerin prior to arrival in the ED. He has some wheezing on exam, CXR c/w fluid overload and pneumonia.  CT angio shows no PE, but does show pneumonia, c/w bronchitis as well with interstitial edema.  Pt treated with albuterol which did help his work of breathing.  Started on IV abx, given lasix IV as well as solumedrol.  D/w [facility] for admision to step down bed.    11:10 PM d/w Dr. Olevia Bowens, [facility], patient to go to  stepdown bed.    Alfonzo Beers, MD 09/01/15 [phone]

## 2015-08-31 NOTE — ED Notes (Signed)
Pt  Placed  On  Cardiac  Monitor  Nasal  o2  At  2  l  /  Min      V  Ns  20 angio    l  Hand  1  Att         Pt  States  Pain is  Better  After  The  nitro

## 2015-09-01 ENCOUNTER — Inpatient Hospital Stay (HOSPITAL_COMMUNITY): Payer: 59

## 2015-09-01 ENCOUNTER — Encounter (HOSPITAL_COMMUNITY): Payer: Self-pay | Admitting: Internal Medicine

## 2015-09-01 DIAGNOSIS — I517 Cardiomegaly: Secondary | ICD-10-CM

## 2015-09-01 DIAGNOSIS — I509 Heart failure, unspecified: Secondary | ICD-10-CM

## 2015-09-01 DIAGNOSIS — E785 Hyperlipidemia, unspecified: Secondary | ICD-10-CM

## 2015-09-01 DIAGNOSIS — R609 Edema, unspecified: Secondary | ICD-10-CM

## 2015-09-01 DIAGNOSIS — R000 Tachycardia, unspecified: Secondary | ICD-10-CM | POA: Diagnosis present

## 2015-09-01 DIAGNOSIS — D473 Essential (hemorrhagic) thrombocythemia: Secondary | ICD-10-CM

## 2015-09-01 DIAGNOSIS — J189 Pneumonia, unspecified organism: Secondary | ICD-10-CM | POA: Diagnosis present

## 2015-09-01 DIAGNOSIS — D751 Secondary polycythemia: Secondary | ICD-10-CM

## 2015-09-01 LAB — COMPREHENSIVE METABOLIC PANEL
ALBUMIN: 3.6 g/dL (ref 3.5–5.0)
ALT: 36 U/L (ref 17–63)
AST: 33 U/L (ref 15–41)
Alkaline Phosphatase: 70 U/L (ref 38–126)
Anion gap: 11 (ref 5–15)
BILIRUBIN TOTAL: 1.8 mg/dL — AB (ref 0.3–1.2)
BUN: 15 mg/dL (ref 6–20)
CO2: 24 mmol/L (ref 22–32)
Calcium: 9 mg/dL (ref 8.9–10.3)
Chloride: 103 mmol/L (ref 101–111)
Creatinine, Ser: 1.32 mg/dL — ABNORMAL HIGH (ref 0.61–1.24)
GFR calc Af Amer: >60 mL/min (ref >60)
GFR calc non Af Amer: 53 mL/min — ABNORMAL LOW (ref >60)
GLUCOSE: 186 mg/dL — AB (ref 65–99)
POTASSIUM: 4.3 mmol/L (ref 3.5–5.1)
SODIUM: 138 mmol/L (ref 135–145)
Total Protein: 6.4 g/dL — ABNORMAL LOW (ref 6.5–8.1)

## 2015-09-01 LAB — STREP PNEUMONIAE URINARY ANTIGEN: STREP PNEUMO URINARY ANTIGEN: NEGATIVE

## 2015-09-01 LAB — TROPONIN I
TROPONIN I: 0.08 ng/mL — AB (ref <0.031)
TROPONIN I: 0.04 ng/mL — AB (ref <0.031)
Troponin I: 0.12 ng/mL — ABNORMAL HIGH (ref <0.031)
Troponin I: 0.03 ng/mL (ref <0.031)

## 2015-09-01 LAB — PHOSPHORUS: PHOSPHORUS: 3.3 mg/dL (ref 2.5–4.6)

## 2015-09-01 LAB — CBC
HCT: 58 % — ABNORMAL HIGH (ref 39.0–52.0)
Hemoglobin: 18.5 g/dL — ABNORMAL HIGH (ref 13.0–17.0)
MCH: 27.4 pg (ref 26.0–34.0)
MCHC: 31.9 g/dL (ref 30.0–36.0)
MCV: 86.1 fL (ref 78.0–100.0)
PLATELETS: 786 10*3/uL — AB (ref 150–400)
RBC: 6.74 MIL/uL — AB (ref 4.22–5.81)
RDW: 17.8 % — ABNORMAL HIGH (ref 11.5–15.5)
WBC: 19.9 10*3/uL — AB (ref 4.0–10.5)

## 2015-09-01 LAB — INFLUENZA PANEL BY PCR (TYPE A & B)
H1N1FLUPCR: NOT DETECTED
Influenza A By PCR: NEGATIVE
Influenza B By PCR: NEGATIVE

## 2015-09-01 LAB — CBC WITH DIFFERENTIAL/PLATELET
BASOS ABS: 0 10*3/uL (ref 0.0–0.1)
BASOS PCT: 0 %
EOS PCT: 0 %
Eosinophils Absolute: 0 10*3/uL (ref 0.0–0.7)
HEMATOCRIT: 59.5 % — AB (ref 39.0–52.0)
Hemoglobin: 19.1 g/dL — ABNORMAL HIGH (ref 13.0–17.0)
LYMPHS PCT: 4 %
Lymphs Abs: 0.7 10*3/uL (ref 0.7–4.0)
MCH: 27.5 pg (ref 26.0–34.0)
MCHC: 32.1 g/dL (ref 30.0–36.0)
MCV: 85.6 fL (ref 78.0–100.0)
Monocytes Absolute: 0.2 10*3/uL (ref 0.1–1.0)
Monocytes Relative: 1 %
NEUTROS ABS: 18.2 10*3/uL — AB (ref 1.7–7.7)
Neutrophils Relative %: 95 %
PLATELETS: 855 10*3/uL — AB (ref 150–400)
RBC: 6.95 MIL/uL — AB (ref 4.22–5.81)
RDW: 17.8 % — ABNORMAL HIGH (ref 11.5–15.5)
WBC: 19.1 10*3/uL — ABNORMAL HIGH (ref 4.0–10.5)

## 2015-09-01 LAB — MRSA PCR SCREENING: MRSA by PCR: NEGATIVE

## 2015-09-01 LAB — MAGNESIUM: Magnesium: 2 mg/dL (ref 1.7–2.4)

## 2015-09-01 MED ORDER — DILTIAZEM HCL 60 MG PO TABS: 60.0000 mg | ORAL_TABLET | Freq: Four times a day (QID) | ORAL | Status: DC

## 2015-09-01 MED ORDER — LEVALBUTEROL HCL 0.63 MG/3ML IN NEBU
1.2500 mg | INHALATION_SOLUTION | Freq: Four times a day (QID) | RESPIRATORY_TRACT | Status: DC
  Administered 2015-09-01: 0.63 mg via RESPIRATORY_TRACT
  Filled 2015-09-01: qty 6

## 2015-09-01 MED ORDER — ENOXAPARIN SODIUM 40 MG/0.4ML ~~LOC~~ SOLN
40.0000 mg | SUBCUTANEOUS | Status: DC
  Administered 2015-09-01 – 2015-09-04 (×4): 40 mg via SUBCUTANEOUS
  Filled 2015-09-01 (×4): qty 0.4

## 2015-09-01 MED ORDER — GUAIFENESIN ER 600 MG PO TB12
600.0000 mg | ORAL_TABLET | Freq: Two times a day (BID) | ORAL | Status: DC
  Administered 2015-09-01 – 2015-09-06 (×10): 600 mg via ORAL
  Filled 2015-09-01 (×11): qty 1

## 2015-09-01 MED ORDER — ONDANSETRON HCL 4 MG/2ML IJ SOLN: 4.0000 mg | Freq: Four times a day (QID) | INTRAMUSCULAR | Status: DC | PRN

## 2015-09-01 MED ORDER — FUROSEMIDE 10 MG/ML IJ SOLN
40.0000 mg | Freq: Two times a day (BID) | INTRAMUSCULAR | Status: DC
  Administered 2015-09-01 – 2015-09-03 (×4): 40 mg via INTRAVENOUS
  Filled 2015-09-01 (×4): qty 4

## 2015-09-01 MED ORDER — PRAVASTATIN SODIUM 40 MG PO TABS
20.0000 mg | ORAL_TABLET | Freq: Every day | ORAL | Status: DC
  Administered 2015-09-01 – 2015-09-05 (×5): 20 mg via ORAL
  Filled 2015-09-01 (×4): qty 1

## 2015-09-01 MED ORDER — PERFLUTREN LIPID MICROSPHERE
1.0000 mL | INTRAVENOUS | Status: AC | PRN
  Administered 2015-09-01: 2 mL via INTRAVENOUS
  Filled 2015-09-01: qty 10

## 2015-09-01 MED ORDER — HYDROXYUREA 500 MG PO CAPS
500.0000 mg | ORAL_CAPSULE | Freq: Two times a day (BID) | ORAL | Status: DC
  Administered 2015-09-01 – 2015-09-06 (×11): 500 mg via ORAL
  Filled 2015-09-01 (×14): qty 1

## 2015-09-01 MED ORDER — DILTIAZEM LOAD VIA INFUSION
10.0000 mg | Freq: Once | INTRAVENOUS | Status: AC
  Administered 2015-09-01: 10 mg via INTRAVENOUS
  Filled 2015-09-01: qty 10

## 2015-09-01 MED ORDER — DILTIAZEM HCL 60 MG PO TABS: 60.0000 mg | ORAL_TABLET | Freq: Once | ORAL | Status: DC

## 2015-09-01 MED ORDER — NAPROXEN 250 MG PO TABS
250.0000 mg | ORAL_TABLET | Freq: Every day | ORAL | Status: DC | PRN
  Filled 2015-09-01: qty 1

## 2015-09-01 MED ORDER — ASPIRIN 81 MG PO CHEW
81.0000 mg | CHEWABLE_TABLET | Freq: Every day | ORAL | Status: DC
  Administered 2015-09-01 – 2015-09-05 (×5): 81 mg via ORAL
  Filled 2015-09-01 (×5): qty 1

## 2015-09-01 MED ORDER — DEXTROSE 5 % IV SOLN
1.0000 g | INTRAVENOUS | Status: DC
  Administered 2015-09-01 – 2015-09-05 (×5): 1 g via INTRAVENOUS
  Filled 2015-09-01 (×6): qty 10

## 2015-09-01 MED ORDER — SODIUM CHLORIDE 0.9 % IV SOLN: 250.0000 mL | INTRAVENOUS | Status: DC | PRN

## 2015-09-01 MED ORDER — ONDANSETRON HCL 4 MG PO TABS: 4.0000 mg | ORAL_TABLET | Freq: Four times a day (QID) | ORAL | Status: DC | PRN

## 2015-09-01 MED ORDER — TAMSULOSIN HCL 0.4 MG PO CAPS
0.4000 mg | ORAL_CAPSULE | Freq: Every day | ORAL | Status: DC
  Administered 2015-09-01 – 2015-09-06 (×6): 0.4 mg via ORAL
  Filled 2015-09-01 (×6): qty 1

## 2015-09-01 MED ORDER — METOPROLOL TARTRATE 1 MG/ML IV SOLN
5.0000 mg | Freq: Once | INTRAVENOUS | Status: AC
  Administered 2015-09-01: 5 mg via INTRAVENOUS
  Filled 2015-09-01: qty 5

## 2015-09-01 MED ORDER — PANTOPRAZOLE SODIUM 40 MG PO TBEC
40.0000 mg | DELAYED_RELEASE_TABLET | Freq: Every day | ORAL | Status: DC
  Administered 2015-09-01 – 2015-09-06 (×6): 40 mg via ORAL
  Filled 2015-09-01 (×7): qty 1

## 2015-09-01 MED ORDER — DILTIAZEM HCL 100 MG IV SOLR
5.0000 mg/h | INTRAVENOUS | Status: DC
  Administered 2015-09-01: 5 mg/h via INTRAVENOUS
  Administered 2015-09-02: 10 mg/h via INTRAVENOUS
  Filled 2015-09-01 (×3): qty 100

## 2015-09-01 MED ORDER — CETYLPYRIDINIUM CHLORIDE 0.05 % MT LIQD
7.0000 mL | Freq: Two times a day (BID) | OROMUCOSAL | Status: DC
  Administered 2015-09-01 – 2015-09-06 (×11): 7 mL via OROMUCOSAL

## 2015-09-01 MED ORDER — DEXTROSE 5 % IV SOLN
500.0000 mg | INTRAVENOUS | Status: DC
  Administered 2015-09-01 – 2015-09-05 (×5): 500 mg via INTRAVENOUS
  Filled 2015-09-01 (×6): qty 500

## 2015-09-01 MED ORDER — FUROSEMIDE 10 MG/ML IJ SOLN
20.0000 mg | Freq: Two times a day (BID) | INTRAMUSCULAR | Status: DC
  Administered 2015-09-01: 20 mg via INTRAVENOUS
  Filled 2015-09-01: qty 2

## 2015-09-01 MED ORDER — SODIUM CHLORIDE 0.9 % IJ SOLN: 3.0000 mL | INTRAMUSCULAR | Status: DC | PRN

## 2015-09-01 MED ORDER — DILTIAZEM HCL 30 MG PO TABS
30.0000 mg | ORAL_TABLET | Freq: Three times a day (TID) | ORAL | Status: DC
  Administered 2015-09-01: 30 mg via ORAL
  Filled 2015-09-01: qty 1

## 2015-09-01 MED ORDER — METHYLPREDNISOLONE SODIUM SUCC 125 MG IJ SOLR
60.0000 mg | Freq: Four times a day (QID) | INTRAMUSCULAR | Status: DC
  Administered 2015-09-01 – 2015-09-02 (×5): 60 mg via INTRAVENOUS
  Filled 2015-09-01 (×5): qty 2

## 2015-09-01 MED ORDER — AMLODIPINE BESYLATE 5 MG PO TABS
5.0000 mg | ORAL_TABLET | Freq: Every day | ORAL | Status: DC
  Administered 2015-09-01: 5 mg via ORAL
  Filled 2015-09-01: qty 1

## 2015-09-01 MED ORDER — IPRATROPIUM BROMIDE 0.02 % IN SOLN
0.5000 mg | Freq: Four times a day (QID) | RESPIRATORY_TRACT | Status: DC
  Administered 2015-09-01: 0.5 mg via RESPIRATORY_TRACT
  Filled 2015-09-01: qty 2.5

## 2015-09-01 MED ORDER — MAGNESIUM SULFATE 2 GM/50ML IV SOLN
2.0000 g | Freq: Once | INTRAVENOUS | Status: AC
  Administered 2015-09-01: 2 g via INTRAVENOUS
  Filled 2015-09-01: qty 50

## 2015-09-01 MED ORDER — LEVALBUTEROL HCL 0.63 MG/3ML IN NEBU: 0.6300 mg | INHALATION_SOLUTION | Freq: Four times a day (QID) | RESPIRATORY_TRACT | Status: DC | PRN

## 2015-09-01 MED ORDER — PERFLUTREN LIPID MICROSPHERE
INTRAVENOUS | Status: AC
  Filled 2015-09-01: qty 10

## 2015-09-01 MED ORDER — TADALAFIL 20 MG PO TABS: 20.0000 mg | ORAL_TABLET | Freq: Every day | ORAL | Status: DC | PRN

## 2015-09-01 MED ORDER — SODIUM CHLORIDE 0.9 % IJ SOLN
3.0000 mL | Freq: Two times a day (BID) | INTRAMUSCULAR | Status: DC
  Administered 2015-09-01 – 2015-09-06 (×13): 3 mL via INTRAVENOUS

## 2015-09-01 NOTE — Progress Notes (Addendum)
Patient admitted this morning by ER for chest pain and shortness of breath Workup consistent with acute bronchitis, probable pneumonia, no PE, Found to have abnormal troponin, now trending up, has a history of myeloproliferative disorder/polycythemia Intermittent episodes of SVT with heart rate in the 140s, Repeat 2-D echo Cardiology consulted for abnormal troponin and chest pain, may need inpatient ischemic workup prior to discharge  Recently lost his sister, grieving

## 2015-09-01 NOTE — Progress Notes (Deleted)
Echocardiogram 2D Echocardiogram with Definity has been performed.  Dillon [name] 09/01/2015, 2:39 PM

## 2015-09-01 NOTE — Progress Notes (Signed)
Echocardiogram 2D Echocardiogram with Definity has been performed.  Tresa Res 09/01/2015, 4:13 PM

## 2015-09-01 NOTE — Progress Notes (Signed)
Came to assess pt. Pt is sleeping at this time. No distress or complications noted.

## 2015-09-01 NOTE — H&P (Signed)
[facility] History and Physical  Dillon [name] [id number] DOB: [birth year] DOA: 08/31/2015  Referring physician: Alfonzo Beers, MD PCP: Cathlean Cower, MD   Chief Complaint: Shortness of Breath  HPI: Dillon [name] is a 69 y.o. male with a past medical history of hyperlipidemia, GERD, essential hypertension, LVH, osteoarthritis, polycythemia, thrombocytosis who comes to the emergency department with complaints of shortness of breath and chest tightness for several days, but particularly worse in the last 2 days. The pain is substernal, pressure-like, nonradiating, pleuritic, associated with palpitations. He denies nausea, edema, dizziness or diaphoresis. Patient has been having low-grade temperatures at home and cough mostly nonproductive. He denies travel history or sick contacts. He received his pneumonia vaccine and flu vaccine this year. He has a remote history of smoking, but stopped in the 1970s. The patient smoked half a pack of cigarettes for several years. He does not use inhalers or oxygen at home.   When the patient was in the emergency department, he had several brief episodes of sinus tachycardia up in the 150s without worsening of his chest pain. The patient had already received multiple nebulizer treatments at that time. Tachycardia subsided with metoprolol 5 mg IV and magnesium sulfate 2 g IV piggyback.    Review of Systems:  Constitutional:  Positive for low grade Fevers, chills, fatigue.  No weight loss, night sweats,  HEENT:  Occasional headaches, Difficulty swallowing,Tooth/dental problems,Sore throat,  No sneezing, itching, ear ache, nasal congestion, post nasal drip,  Cardio-vascular:  No chest pain, Orthopnea, PND, swelling in lower extremities, anasarca, dizziness, palpitations  GI:  No heartburn, indigestion, abdominal pain, nausea, vomiting, diarrhea, change in bowel habits, loss of appetite  Resp:  Positive dyspnea, positive mostly nonproductive  cough, positive wheezing, no hemoptysis Skin:  no rash or lesions.  GU:  no dysuria, change in color of urine, no urgency or frequency. No flank pain.  Musculoskeletal:  No joint pain or swelling. No decreased range of motion. No back pain.  Psych:  No change in mood or affect. No depression or anxiety. No memory loss.   Past Medical History  Diagnosis Date  . ERECTILE DYSFUNCTION 11/15/2008  . FATIGUE 11/15/2008  . GERD 11/15/2008  . HYPERLIPIDEMIA 11/15/2008  . LEG PAIN, LEFT 11/05/2010  . MIGRAINE, COMMON 08/06/2010  . Nocturia 12/19/2008  . Swelling of limb 11/05/2010  . THROMBOCYTOSIS 12/19/2008    myleoproliferative disorder  . Impaired glucose tolerance 04/18/2011  . Bladder neck obstruction 04/18/2011  . Polycythemia 04/27/2012  . LVH (left ventricular hypertrophy) 04/27/2012  . Essential hypertension, benign 04/28/2013  . History of adenomatous polyp of colon 05/06/2014    2010  . Arthritis     right shoulder   Past Surgical History  Procedure Laterality Date  . Appendectomy    . Tonsillectomy    . S/p esophageal dilation    . Upper gastrointestinal endoscopy    . Colonoscopy    . Polypectomy    . Circumcision      30+yrs ago   Social History:  reports that he quit smoking about 44 years ago. His smoking use included Cigarettes. He has a 6 pack-year smoking history. He has never used smokeless tobacco. He reports that he drinks alcohol. He reports that he does not use illicit drugs.  No Known Allergies  Family History  Problem Relation Age of Onset  . Hypertension Mother   . Cancer Father     prostate cancer  . Prostate cancer Father   . Hypertension  Sister   . Hypertension Brother   . Colon cancer Neg Hx   . Rectal cancer Neg Hx   . Stomach cancer Neg Hx   . Esophageal cancer Neg Hx   . Pancreatic cancer Neg Hx     Prior to Admission medications   Medication Sig Start Date End Date Taking? Authorizing Provider  amLODipine (NORVASC) 5 MG tablet TAKE 1 TABLET (5 MG  TOTAL) BY MOUTH DAILY. 11/09/14  Yes Biagio Borg, MD  aspirin 81 MG tablet Take 81 mg by mouth daily.     Yes Historical Provider, MD  hydroxyurea (HYDREA) 500 MG capsule Take 1 capsule (500 mg total) by mouth 2 (two) times daily. May take with food to minimize GI side effects. 11/26/13  Yes Wyatt Portela, MD  lovastatin (MEVACOR) 40 MG tablet TAKE 1 TABLET (40 MG TOTAL) BY MOUTH DAILY. 11/11/14  Yes Biagio Borg, MD  naproxen sodium (ANAPROX) 220 MG tablet Take 220 mg by mouth daily as needed (general pain).   Yes Historical Provider, MD  tadalafil (CIALIS) 20 MG tablet Take 1 tablet (20 mg total) by mouth daily as needed for erectile dysfunction. 04/28/13 08/31/15 Yes Biagio Borg, MD  tamsulosin (FLOMAX) 0.4 MG CAPS capsule TAKE 1 CAPSULE (0.4 MG TOTAL) BY MOUTH DAILY. 11/11/14  Yes Biagio Borg, MD   Physical Exam: Filed Vitals:   08/31/15 2145 08/31/15 2215 08/31/15 2230 08/31/15 2245  BP: 156/91 152/102 153/95 135/100  Pulse: 89 99 98 97  Temp:      TempSrc:      Resp:  21 18 21   Height:      Weight:      SpO2: 100% 98% 98% 98%    Wt Readings from Last 3 Encounters:  08/31/15 79.379 kg (175 lb)  05/04/15 82.328 kg (181 lb 8 oz)  11/11/14 81.194 kg (179 lb)    General:  Appears mildly anxious. Eyes: PERRL, normal lids, irises & conjunctiva ENT: grossly normal hearing, lips, oral mucosa are mildly dry. Neck: no LAD, masses or thyromegaly Cardiovascular: RRR, no m/r/g. No LE edema. Telemetry: SR alternating with sinus tachycardia. Respiratory: Bilateral rhonchi and wheezing. Rales in both bases. Abdomen: soft, ntnd Skin: no rash or induration seen on limited exam Musculoskeletal: grossly normal tone BUE/BLE Psychiatric: grossly normal mood and affect, speech fluent and appropriate Neurologic: grossly non-focal.          Labs on Admission:  Basic Metabolic Panel:  Recent Labs Lab 08/31/15 0025 08/31/15 1931  NA  --  138  K  --  4.5  CL  --  105  CO2  --  25    GLUCOSE  --  103*  BUN  --  15  CREATININE  --  1.21  CALCIUM  --  9.3  MG 2.0  --   PHOS 3.3  --    CBC:  Recent Labs Lab 08/31/15 1931  WBC 16.3*  HGB 18.9*  HCT 59.4*  MCV 86.2  PLT 797*    BNP (last 3 results)  Recent Labs  08/31/15 1931  BNP 705.8*     Radiological Exams on Admission: Dg Chest 2 View  08/31/2015  CLINICAL DATA:  SOB. Pt c/o SOB with exertion x 1 day. No other chest complaints. Pt currently on 2 liters of oxygen that he states helps. Hx ex-smoker as of 45 years ago and HTN controlled with medication. EXAM: CHEST  2 VIEW COMPARISON:  None. FINDINGS: Midline trachea. Mild cardiomegaly.  Mediastinal contours otherwise within normal limits. No pleural effusion or pneumothorax. Thickening of the fissures. Pulmonary interstitial prominence and indistinctness. Retrocardiac left lower lobe patchy airspace disease. IMPRESSION: Mild cardiomegaly with interstitial edema. Patchy left base airspace disease is suspicious for concurrent infection. Followup PA and lateral chest X-ray is recommended in 3-4 weeks following trial of antibiotic therapy to ensure resolution and exclude underlying malignancy. Electronically Signed   By: Abigail Miyamoto M.D.   On: 08/31/2015 19:56   Ct Angio Chest Pe W/cm &/or Wo Cm  08/31/2015  CLINICAL DATA:  Shortness of breath when lying flat. Elevated D-dimer. EXAM: CT ANGIOGRAPHY CHEST WITH CONTRAST TECHNIQUE: Multidetector CT imaging of the chest was performed using the standard protocol during bolus administration of intravenous contrast. Multiplanar CT image reconstructions and MIPs were obtained to evaluate the vascular anatomy. CONTRAST:  34mL OMNIPAQUE IOHEXOL 350 MG/ML SOLN COMPARISON:  Chest radiograph 08/31/2015 FINDINGS: Technically adequate study with good opacification of the central and segmental pulmonary arteries. No focal filling defects demonstrated. No evidence of significant pulmonary embolus. Diffuse cardiac enlargement.  Normal caliber thoracic aorta. Lymph nodes demonstrated throughout the mediastinum and hilar regions are not pathologically enlarged and are likely reactive. Esophagus is decompressed. Evaluation of lungs is limited due to respiratory motion artifact. There are small bilateral pleural effusions. There is diffuse interstitial infiltration with airspace infiltration predominantly in the lung bases. This is likely due to edema although superimposed pneumonia is not excluded. Airways thickening suggesting bronchitis. No pneumothorax. Included portions of the upper abdominal organs are grossly unremarkable. Incidental note of a lipoma in the right anterior chest wall. Degenerative changes in the spine. Review of the MIP images confirms the above findings. IMPRESSION: No evidence of significant pulmonary embolus. Cardiac enlargement with vascular congestion and diffuse interstitial edema. Airspace edema versus pneumonia in the lung bases. Bilateral pleural effusions. Peribronchial thickening suggesting bronchitis. Electronically Signed   By: Lucienne Capers M.D.   On: 08/31/2015 22:19    EKG: Independently reviewed. Vent. rate 99 BPM PR interval 171 ms QRS duration 94 ms QT/QTc 383/491 ms P-R-T axes 73 2 78 Sinus rhythm Probable left atrial enlargement Left ventricular hypertrophy Nonspecific T abnormalities, lateral leads Borderline prolonged QT interval No significant change since last tracing earlier today  Assessment/Plan Principal Problem:   CAP (community acquired pneumonia) Admit to a stepdown. Levalbuterol, instead of albuterol for bronchodilator nebulizer treatments. Continue nebulized ipratropium. Continue Solu-Medrol. BiPAP ventilation when necessary. Continue IV antibiotics.  Active Problems:    Sinus tachycardia. Continue cardiac monitoring. Likely due to the use of albuterol. Result with low-dose metoprolol and magnesium sulfate. Levalbuterol, instead of albuterol for  bronchodilator nebulizer treatments. Continue nebulized ipratropium.    Hyperlipidemia Continue pravastatin. Monitor LFTs periodically.    Thrombocytosis (HCC)   Polycythemia Monitor CBC. Lovenox for DVT prophylaxis.    LVH (left ventricular hypertrophy) Check echocardiogram since the patient also has an elevated BNP level.    Essential hypertension, benign Continue amlodipine 5 mg by mouth daily Monitor blood pressure periodically.    Code Status: Full code. DVT Prophylaxis: Lovenox SQ. Family Communication: His brother Herbie [place] was by the bedside. Disposition Plan: Admit to a stepdown for IV antibiotic therapy for community-acquired pneumonia.  Time spent: Over 90 minutes were spent in the process this admission.  Reubin Milan [facility] Pager [phone].

## 2015-09-01 NOTE — ED Notes (Addendum)
Admitting Dr. Olevia Bowens Bedside.

## 2015-09-01 NOTE — Consult Note (Signed)
CARDIOLOGY CONSULT NOTE   Patient ID: Dillon [name] MRN: [id number] DOB/AGE: 71-Jun-1947 71 y.o.  Admit date: 08/31/2015  Primary Physician   Cathlean Cower, MD Primary Cardiologist   New Reason for Consultation   Elevated troponin  HPI: Dillon [name] is a 69 y.o. male with a history of hypertension, hyperlipidemia, polycythemia, thrombocytosis, GERD, former smoker (quit 1972) and LVH who came to [facility] ED 08/31/15 for evaluation of shortness of breath and chest pain.  No prior cardiac history. Never seen by cardiologist. Echocardiogram 04/30/2012 showed left ventricular function of 60% , Mild LVH, no wall motion abnormality, aortic sclerosis without stenosis, mild AR, mild dilated left atrium.   The patient presented to the hospital yesterday with a 2 day history of worsening shortness of breath and substernal chest pain. He described the pain as a pressure/tightness without any radiation, nausea, vomiting or diaphoresis. He has also noticed low-grade temperature with the nonproductive cough as well. Denies recent travel or sick contact.  In ED, EKG showed normal sinus rhythm at the rate of 93 bpm and TWI in lateral which appears chronic. Per H&P, he had several brief episodes of sinus tachycardia up in the 150s without worsening of his chest pain. At that time already received multiple nebulizer treatments at that time. Tachycardia subsided with metoprolol 5 mg IV and magnesium sulfate 2 g IV piggyback. Reviewing telemetry indicates that patient has a frequent small burst of sinus arrhythmia likely sinus tach versus SVT. Now on cardizem 30mg  q 8 hours.  BNP 705.8. Chest x-ray shows cardiomegaly with interstitial edema and concerning for infection. D-dimer was minimally elevated  0.56.  CT angiogram of the chest showed possible pneumonia versus vascular congestion and diffuse interstitial edema. No PE. Troponin trend of 0.08->0.12->0.04.   Past Medical History  Diagnosis  Date  . ERECTILE DYSFUNCTION 11/15/2008  . FATIGUE 11/15/2008  . GERD 11/15/2008  . HYPERLIPIDEMIA 11/15/2008  . LEG PAIN, LEFT 11/05/2010  . MIGRAINE, COMMON 08/06/2010  . Nocturia 12/19/2008  . Swelling of limb 11/05/2010  . THROMBOCYTOSIS 12/19/2008    myleoproliferative disorder  . Impaired glucose tolerance 04/18/2011  . Bladder neck obstruction 04/18/2011  . Polycythemia 04/27/2012  . LVH (left ventricular hypertrophy) 04/27/2012  . Essential hypertension, benign 04/28/2013  . History of adenomatous polyp of colon 05/06/2014    2010  . Arthritis     right shoulder     Past Surgical History  Procedure Laterality Date  . Appendectomy    . Tonsillectomy    . S/p esophageal dilation    . Upper gastrointestinal endoscopy    . Colonoscopy    . Polypectomy    . Circumcision      30+yrs ago    No Known Allergies  I have reviewed the patient's current medications . antiseptic oral rinse  7 mL Mouth Rinse BID  . aspirin  81 mg Oral Daily  . azithromycin  500 mg Intravenous Q24H  . cefTRIAXone (ROCEPHIN)  IV  1 g Intravenous Q24H  . diltiazem  30 mg Oral 3 times per day  . enoxaparin (LOVENOX) injection  40 mg Subcutaneous Q24H  . furosemide  40 mg Intravenous Q12H  . guaiFENesin  600 mg Oral BID  . hydroxyurea  500 mg Oral BID  . methylPREDNISolone (SOLU-MEDROL) injection  60 mg Intravenous Q6H  . pantoprazole  40 mg Oral Daily  . perflutren lipid microspheres (DEFINITY) IV suspension      . pravastatin  20 mg Oral  [id number]  . sodium chloride  3 mL Intravenous Q12H  . tamsulosin  0.4 mg Oral Daily       sodium chloride, levalbuterol, ondansetron **OR** ondansetron (ZOFRAN) IV, perflutren lipid microspheres (DEFINITY) IV suspension, sodium chloride  Prior to Admission medications   Medication Sig Start Date End Date Taking? Authorizing Provider  amLODipine (NORVASC) 5 MG tablet TAKE 1 TABLET (5 MG TOTAL) BY MOUTH DAILY. 11/09/14  Yes Biagio Borg, MD  aspirin 81 MG tablet Take 81 mg by  mouth daily.     Yes Historical Provider, MD  hydroxyurea (HYDREA) 500 MG capsule Take 1 capsule (500 mg total) by mouth 2 (two) times daily. May take with food to minimize GI side effects. 11/26/13  Yes Wyatt Portela, MD  lovastatin (MEVACOR) 40 MG tablet TAKE 1 TABLET (40 MG TOTAL) BY MOUTH DAILY. 11/11/14  Yes Biagio Borg, MD  naproxen sodium (ANAPROX) 220 MG tablet Take 220 mg by mouth daily as needed (general pain).   Yes Historical Provider, MD  tadalafil (CIALIS) 20 MG tablet Take 1 tablet (20 mg total) by mouth daily as needed for erectile dysfunction. 04/28/13 08/31/15 Yes Biagio Borg, MD  tamsulosin (FLOMAX) 0.4 MG CAPS capsule TAKE 1 CAPSULE (0.4 MG TOTAL) BY MOUTH DAILY. 11/11/14  Yes Biagio Borg, MD     Social History   Social History  . Marital Status: Married    Spouse Name: N/A  . Number of Children: N/A  . Years of Education: N/A   Occupational History  . Engineer, manufacturing systems    Social History Main Topics  . Smoking status: Former Smoker -- 0.50 packs/day for 12 years    Types: Cigarettes    Quit date: 09/16/1970  . Smokeless tobacco: Never Used     Comment: hasn't smoked in 46 years;   . Alcohol Use: 0.0 oz/week    0 Standard drinks or equivalent per week     Comment: occasional;   . Drug Use: No  . Sexual Activity: Not on file   Other Topics Concern  . Not on file   Social History Narrative    Family Status  Relation Status Death Age  . Mother Deceased   . Father Deceased   . Sister      mental retardation  . Brother Deceased    Family History  Problem Relation Age of Onset  . Hypertension Mother   . Cancer Father     prostate cancer  . Prostate cancer Father   . Hypertension Sister   . Hypertension Brother   . Colon cancer Neg Hx   . Rectal cancer Neg Hx   . Stomach cancer Neg Hx   . Esophageal cancer Neg Hx   . Pancreatic cancer Neg Hx     ROS:  Full 14 point review of systems complete and found to be negative unless listed above.  Physical  Exam: Blood pressure 132/80, pulse 81, temperature 97.3 F (36.3 C), temperature source Oral, resp. rate 16, height 5\' 9"  (1.753 m), weight 184 lb 15.5 oz (83.9 kg), SpO2 98 %.  General: Well developed, well nourished, male in no acute distress who laying in bed comfortably and nasal canula in place.  Head: Eyes PERRLA, No xanthomas. Normocephalic and atraumatic, oropharynx without edema or exudate.  Lungs: Resp regular and unlabored. Diminished breath sound through out with crackles R > L on bases.  Heart: RR with tachycardia.  no s3, s4, or murmurs..   Neck: No carotid  bruits. No lymphadenopathy.  + JVD. Abdomen: Bowel sounds present, abdomen soft and non-tender without masses or hernias noted. Msk:  No spine or cva tenderness. No weakness, no joint deformities or effusions. Extremities: No clubbing, cyanosis or edema. DP/PT/Radials 2+ and equal bilaterally. Neuro: Alert and oriented X 3. No focal deficits noted. Psych:  Good affect, responds appropriately Skin: No rashes or lesions noted.  Labs:   Lab Results  Component Value Date   WBC 19.1* 09/01/2015   HGB 19.1* 09/01/2015   HCT 59.5* 09/01/2015   MCV 85.6 09/01/2015   PLT 855* 09/01/2015   No results for input(s): INR in the last 72 hours.  Recent Labs Lab 09/01/15 0424  NA 138  K 4.3  CL 103  CO2 24  BUN 15  CREATININE 1.32*  CALCIUM 9.0  PROT 6.4*  BILITOT 1.8*  ALKPHOS 70  ALT 36  AST 33  GLUCOSE 186*  ALBUMIN 3.6   MAGNESIUM  Date Value Ref Range Status  08/31/2015 2.0 1.7 - 2.4 mg/dL Final    Recent Labs  09/01/15 0318 09/01/15 0818 09/01/15 1230  TROPONINI 0.08* 0.12* 0.04*    Recent Labs  08/31/15 1931  TROPIPOC 0.00    Lab Results  Component Value Date   DDIMER 0.56* 08/31/2015    Echo: 04/30/2012 LV EF: 60%  ------------------------------------------------------------ Indications:   Abnormal EKG 794.31.  ------------------------------------------------------------ History:   PMH: Polycthemia Acquired from the patient and from the patient's chart. Fatigue. Risk factors: Former tobacco use. Hypertension. Dyslipidemia.  ------------------------------------------------------------ Study Conclusions  - Left ventricle: There are some prominent trabeculae at the apex, but I doubt any unusual diagnoses. Wall motion is good. The cavity size was normal. Wall thickness was increased in a pattern of mild LVH. The estimated ejection fraction was 60%. Wall motion was normal; there were no regional wall motion abnormalities. - Aortic valve: Sclerosis without stenosis. Mild regurgitation. - Left atrium: The atrium was mildly dilated.  ECG:  Vent. rate 93 BPM PR interval 170 ms QRS duration 90 ms QT/QTc 394/489 ms P-R-T axes 71 -28 81  Radiology:  Dg Chest 2 View  08/31/2015  CLINICAL DATA:  SOB. Pt c/o SOB with exertion x 1 day. No other chest complaints. Pt currently on 2 liters of oxygen that he states helps. Hx ex-smoker as of 45 years ago and HTN controlled with medication. EXAM: CHEST  2 VIEW COMPARISON:  None. FINDINGS: Midline trachea. Mild cardiomegaly. Mediastinal contours otherwise within normal limits. No pleural effusion or pneumothorax. Thickening of the fissures. Pulmonary interstitial prominence and indistinctness. Retrocardiac left lower lobe patchy airspace disease. IMPRESSION: Mild cardiomegaly with interstitial edema. Patchy left base airspace disease is suspicious for concurrent infection. Followup PA and lateral chest X-ray is recommended in 3-4 weeks following trial of antibiotic therapy to ensure resolution and exclude underlying malignancy. Electronically Signed   By: Abigail Miyamoto M.D.   On: 08/31/2015 19:56   Ct Angio Chest Pe W/cm &/or Wo Cm  08/31/2015  CLINICAL DATA:  Shortness of breath when lying flat. Elevated D-dimer. EXAM: CT ANGIOGRAPHY CHEST WITH CONTRAST TECHNIQUE: Multidetector CT imaging of the chest was performed using  the standard protocol during bolus administration of intravenous contrast. Multiplanar CT image reconstructions and MIPs were obtained to evaluate the vascular anatomy. CONTRAST:  36mL OMNIPAQUE IOHEXOL 350 MG/ML SOLN COMPARISON:  Chest radiograph 08/31/2015 FINDINGS: Technically adequate study with good opacification of the central and segmental pulmonary arteries. No focal filling defects demonstrated. No evidence of significant pulmonary embolus.  Diffuse cardiac enlargement. Normal caliber thoracic aorta. Lymph nodes demonstrated throughout the mediastinum and hilar regions are not pathologically enlarged and are likely reactive. Esophagus is decompressed. Evaluation of lungs is limited due to respiratory motion artifact. There are small bilateral pleural effusions. There is diffuse interstitial infiltration with airspace infiltration predominantly in the lung bases. This is likely due to edema although superimposed pneumonia is not excluded. Airways thickening suggesting bronchitis. No pneumothorax. Included portions of the upper abdominal organs are grossly unremarkable. Incidental note of a lipoma in the right anterior chest wall. Degenerative changes in the spine. Review of the MIP images confirms the above findings. IMPRESSION: No evidence of significant pulmonary embolus. Cardiac enlargement with vascular congestion and diffuse interstitial edema. Airspace edema versus pneumonia in the lung bases. Bilateral pleural effusions. Peribronchial thickening suggesting bronchitis. Electronically Signed   By: Lucienne Capers M.D.   On: 08/31/2015 22:19    ASSESSMENT AND PLAN:     1. Sinus arrhythmia - EKG showed normal sinus rhythm at the rate of 93 bpm and TWI in lateral which appears chronic.  - He was given metoprolol 5 mg IV and magnesium sulfate 2 g IV for sinus tachycardia initially (at that time he had already have received multiple nebulizer treatment). Now on Cardizem 30 mg every 8 hours. Will give  bolus of IV cardizen 10 and then start on 29ml/hr.  - Reviewing telemetry indicates that patient has a frequent small burst of sinus arrhythmia likely sinus tach versus SVT vs afib (occasional irregular rhythm). M.D. to review. - Tachycardia could be a result of multiple nebulizer treatment versus acute respiratory infection.  -  2. Elevated troponin - Troponin trend of 0.08->0.12->0.04. - He also has typical and atypical chest pain that he describes as a substernal pressure/tight pain that has been ongoing for the past few days. Pleuritic in nature. Will do Myoview once respiratory illness and rhythm stabilized.   3. Community-acquired pneumonia - On Abx  4. Cardiomegaly with pulmonary edema - Continue IV Lasix 40 mg. Seems mild volume overload. Will be from pneumonia as well.  - Pending reading of echocardiogram.  - Echocardiogram 04/30/2012 showed left ventricular function of 60% , Mild LVH, no wall motion abnormality, aortic sclerosis without stenosis, mild AR, mild dilated left atrium.   5. Polycythemia and thrombocytosis - Per primary  6. Hl - 11/11/2014: Cholesterol 125; HDL 55.60; LDL Cholesterol 43; Triglycerides 132.0; VLDL 26.4  - Continue statin  7. HTN - Relatively stable.    SignedLeanor Kail, PA 09/01/2015, 2:42 PM Pager [phone]  Co-Sign MD  Patient seen and examined. Agree with assessment and plan. Dillon [name] is a 69 year old African-American male who has a history of hypertension, hyperlipidemia, GERD, who was admitted last evening with increasing shortness of breath and some chest pressure.  The patient has a history of left ventricular hypertrophy and prior ECGs have documented LVH with T-wave abnormalities suggestive of repolarization changes.  An echo Doppler study in August 2013 revealed normal systolic function without wall motion abnormality, mild LVH, aortic valve sclerosis without stenosis with mild AR and mild left atrial dilatation.   The patient has remote tobacco history but quit smoking many years ago.  Over the past several days he has been experiencing increasing shortness of breath with substernal chest discomfort described as tightness without nausea, vomiting or diaphoresis.  He also has had low-grade temperature with nonproductive cough.  He was omitted last evening.  Since his admission he has had recurrent episodes of  sinus rhythm alternating with tachycardia with rates up to 140-150 bpm which may be due to ectopic atrial tachycardia/SVT and at times this has been irregular suggesting possible AF.  He has had mild troponin elevation.  He was found to have mildly positive d-dimer.  A CT angiogram did not reveal any pulmonary embolism.  There was evidence for interstitial pulmonary edema as well as bilateral pleural effusions with possible airspace edema versus pneumonia in the lower lung fields.  Laboratory is notable for elevation of white blood cell, red blood cell, and platelets suggesting the possibility of polycythemia vera.  When I went to examine the patient, he was having repetitive bursts where his heart rate would go from the 80s and increase into the 140s to 150s.  As result, I will start him on Cardizem IV 10 mg bolus and will initiate an infusion at 10 mg per hour.  He had been on just 30 mg every 8 hours of oral diltiazem.  His most recent ECG, independently reviewed by me shows sinus rhythm at 99 bpm with LVH and lateral T-wave abnormalities and prolonged QTC.  An echo Doppler study was done, results are still pending.  He may ultimately require cardiac catheterization for definitive evaluation.   Troy Sine, MD, [facility] 09/01/2015 4:24 PM

## 2015-09-01 NOTE — Care Management Note (Signed)
Case Management Note  Patient Details  Name: Dillon [name] MRN: [id number] Date of Birth: 01/05/1946  Subjective/Objective:     Adm w pneumonia,heart failure               Action/Plan: lives w fam, pcp dr Cathlean Cower   Expected Discharge Date:                  Expected Discharge Plan:     In-House Referral:     Discharge planning Services     Post Acute Care Choice:    Choice offered to:     DME Arranged:    DME Agency:     HH Arranged:    [place] Agency:     Status of Service:     Medicare Important Message Given:    Date Medicare IM Given:    Medicare IM give by:    Date Additional Medicare IM Given:    Additional Medicare Important Message give by:     If discussed at [place] of Stay Meetings, dates discussed:    Additional Comments: ur review done  Lacretia Leigh, RN 09/01/2015, 8:24 AM

## 2015-09-02 DIAGNOSIS — I10 Essential (primary) hypertension: Secondary | ICD-10-CM

## 2015-09-02 DIAGNOSIS — R000 Tachycardia, unspecified: Secondary | ICD-10-CM

## 2015-09-02 DIAGNOSIS — I5041 Acute combined systolic (congestive) and diastolic (congestive) heart failure: Secondary | ICD-10-CM

## 2015-09-02 DIAGNOSIS — I5021 Acute systolic (congestive) heart failure: Secondary | ICD-10-CM

## 2015-09-02 LAB — COMPREHENSIVE METABOLIC PANEL
ALBUMIN: 3.3 g/dL — AB (ref 3.5–5.0)
ALT: 35 U/L (ref 17–63)
AST: 21 U/L (ref 15–41)
Alkaline Phosphatase: 64 U/L (ref 38–126)
Anion gap: 10 (ref 5–15)
BUN: 26 mg/dL — AB (ref 6–20)
CHLORIDE: 101 mmol/L (ref 101–111)
CO2: 28 mmol/L (ref 22–32)
Calcium: 8.9 mg/dL (ref 8.9–10.3)
Creatinine, Ser: 1.39 mg/dL — ABNORMAL HIGH (ref 0.61–1.24)
GFR calc Af Amer: 57 mL/min — ABNORMAL LOW (ref >60)
GFR, EST NON AFRICAN AMERICAN: 49 mL/min — AB (ref >60)
GLUCOSE: 134 mg/dL — AB (ref 65–99)
POTASSIUM: 4.3 mmol/L (ref 3.5–5.1)
Sodium: 139 mmol/L (ref 135–145)
Total Bilirubin: 0.9 mg/dL (ref 0.3–1.2)
Total Protein: 6.2 g/dL — ABNORMAL LOW (ref 6.5–8.1)

## 2015-09-02 LAB — CBC WITH DIFFERENTIAL/PLATELET
BASOS ABS: 0 10*3/uL (ref 0.0–0.1)
Basophils Relative: 0 %
EOS ABS: 0 10*3/uL (ref 0.0–0.7)
Eosinophils Relative: 0 %
HCT: 56 % — ABNORMAL HIGH (ref 39.0–52.0)
Hemoglobin: 17.6 g/dL — ABNORMAL HIGH (ref 13.0–17.0)
LYMPHS ABS: 0.8 10*3/uL (ref 0.7–4.0)
Lymphocytes Relative: 3 %
MCH: 27 pg (ref 26.0–34.0)
MCHC: 31.4 g/dL (ref 30.0–36.0)
MCV: 85.8 fL (ref 78.0–100.0)
MONO ABS: 0.5 10*3/uL (ref 0.1–1.0)
Monocytes Relative: 2 %
Neutro Abs: 25.1 10*3/uL — ABNORMAL HIGH (ref 1.7–7.7)
Neutrophils Relative %: 95 %
PLATELETS: 824 10*3/uL — AB (ref 150–400)
RBC: 6.53 MIL/uL — AB (ref 4.22–5.81)
RDW: 17.7 % — AB (ref 11.5–15.5)
WBC: 26.4 10*3/uL — AB (ref 4.0–10.5)

## 2015-09-02 MED ORDER — METHYLPREDNISOLONE SODIUM SUCC 40 MG IJ SOLR
40.0000 mg | Freq: Four times a day (QID) | INTRAMUSCULAR | Status: DC
  Administered 2015-09-02 – 2015-09-03 (×5): 40 mg via INTRAVENOUS
  Filled 2015-09-02 (×5): qty 1

## 2015-09-02 MED ORDER — CARVEDILOL 3.125 MG PO TABS
3.1250 mg | ORAL_TABLET | Freq: Two times a day (BID) | ORAL | Status: DC
  Administered 2015-09-02 – 2015-09-04 (×3): 3.125 mg via ORAL
  Filled 2015-09-02 (×3): qty 1

## 2015-09-02 NOTE — Progress Notes (Signed)
Marland Kitchen    SUBJECTIVE:  Feels much better.  No chest pain.  Breathing better   PHYSICAL EXAM Filed Vitals:   09/01/15 1916 09/02/15 0012 09/02/15 0314 09/02/15 0755  BP: 126/89 92/55 105/86 109/81  Pulse: 97 63 71 108  Temp: 98 F (36.7 C) 97.3 F (36.3 C) 97.4 F (36.3 C) 97.9 F (36.6 C)  TempSrc: Oral Oral Oral Oral  Resp: 24 12 13 15   Height:      Weight:      SpO2: 96% 94% 95% 97%   General:  No distress Lungs:  Clear Heart:  RRR Abdomen:  Positive bowel sounds, no rebound no guarding Extremities:  No edema Neck:  No JVD Neuro: Non focal  LABS: Lab Results  Component Value Date   TROPONINI 0.03 09/01/2015   Results for orders placed or performed during the hospital encounter of 08/31/15 (from the past 24 hour(s))  Troponin I     Status: Abnormal   Collection Time: 09/01/15 12:30 PM  Result Value Ref Range   Troponin I 0.04 (H) <0.031 ng/mL  Troponin I     Status: None   Collection Time: 09/01/15  6:53 PM  Result Value Ref Range   Troponin I 0.03 <0.031 ng/mL  CBC WITH DIFFERENTIAL     Status: Abnormal   Collection Time: 09/02/15  2:03 AM  Result Value Ref Range   WBC 26.4 (H) 4.0 - 10.5 K/uL   RBC 6.53 (H) 4.22 - 5.81 MIL/uL   Hemoglobin 17.6 (H) 13.0 - 17.0 g/dL   HCT 56.0 (H) 39.0 - 52.0 %   MCV 85.8 78.0 - 100.0 fL   MCH 27.0 26.0 - 34.0 pg   MCHC 31.4 30.0 - 36.0 g/dL   RDW 17.7 (H) 11.5 - 15.5 %   Platelets 824 (H) 150 - 400 K/uL   Neutrophils Relative % 95 %   Lymphocytes Relative 3 %   Monocytes Relative 2 %   Eosinophils Relative 0 %   Basophils Relative 0 %   Neutro Abs 25.1 (H) 1.7 - 7.7 K/uL   Lymphs Abs 0.8 0.7 - 4.0 K/uL   Monocytes Absolute 0.5 0.1 - 1.0 K/uL   Eosinophils Absolute 0.0 0.0 - 0.7 K/uL   Basophils Absolute 0.0 0.0 - 0.1 K/uL   Smear Review MORPHOLOGY UNREMARKABLE   Comprehensive metabolic panel     Status: Abnormal   Collection Time: 09/02/15  2:03 AM  Result Value Ref Range   Sodium 139 135 - 145 mmol/L   Potassium  4.3 3.5 - 5.1 mmol/L   Chloride 101 101 - 111 mmol/L   CO2 28 22 - 32 mmol/L   Glucose, Bld 134 (H) 65 - 99 mg/dL   BUN 26 (H) 6 - 20 mg/dL   Creatinine, Ser 1.39 (H) 0.61 - 1.24 mg/dL   Calcium 8.9 8.9 - 10.3 mg/dL   Total Protein 6.2 (L) 6.5 - 8.1 g/dL   Albumin 3.3 (L) 3.5 - 5.0 g/dL   AST 21 15 - 41 U/L   ALT 35 17 - 63 U/L   Alkaline Phosphatase 64 38 - 126 U/L   Total Bilirubin 0.9 0.3 - 1.2 mg/dL   GFR calc non Af Amer 49 (L) >60 mL/min   GFR calc Af Amer 57 (L) >60 mL/min   Anion gap 10 5 - 15    Intake/Output Summary (Last 24 hours) at 09/02/15 1030 Last data filed at 09/02/15 0900  Gross per 24 hour  Intake  1225 ml  Output   2000 ml  Net   -775 ml    ECHO:   - Left ventricle: LVEF is severely depressed at approximately 15 to 20% The cavity size was severely dilated. Wall thickness was normal. Doppler parameters are consistent with a reversible restrictive pattern, indicative of decreased left ventricular diastolic compliance and/or increased left atrial pressure (grade 3 diastolic dysfunction). - Mitral valve: There was mild regurgitation. - Left atrium: The atrium was severely dilated. - Right atrium: The atrium was severely dilated.  ASSESSMENT AND PLAN:  CARDIOMYOPATHY:  EF is as above which is new.  He rarely drinks ETOH.   Doubt ischemia but he will need a cardiac cath to rule out CAD.    I will stop the Cardizem and begin a low dose of beta blocker.  His BP is soft so I will not start ACE inhibitor today.   TSH earlier this year normal but I will repeat.   The patient understands that risks included but are not limited to stroke (1 in 1000), death (1 in 52), kidney failure [usually temporary] (1 in 500), bleeding (1 in 200), allergic reaction [possibly serious] (1 in 200).  The patient understands and agrees to proceed.   ELEVATED TROPONIN:   Plan cath as above.   PULMONARY EDEMA:  Manage as above.   I will continue IV Lasix today.    HTN:  BP  is actually low.     Dillon [name] 09/02/2015 10:30 AM

## 2015-09-02 NOTE — Progress Notes (Signed)
Patient is currently on room air with sats of 97%. He is in no distress and all vitals are stable. BIPAP is not needed at this time.

## 2015-09-02 NOTE — Progress Notes (Signed)
[facility] PROGRESS NOTE  Dillon [name] [id number] DOB: January 04, 1946 DOA: 08/31/2015 PCP: Cathlean Cower, MD  Assessment/Plan: 69 y/o male with PMH of HTN, GERD, Polycythemia, CKD, presented with SOB, cough DOE -admitted with Pneumonia, LV dysfunction, CM  1. CAP. CT no PE. Patient is afebrile. Cont IV atx, pend cultures  -? Underlying COPD. ? Occupational. Added bronchodilators prn, steroids started on admission. Monitor   2. New LV dysfunction, CM. Mild acute systolic CHF.  -cont diuresis per cardiology. Likely needs LHC. Defer to cardiology 3. Elevated trop on admission. ? NSTEMI with typical symptoms. Cont ASA, statins, may need BB. Currently on Cardizem for tachycardia. Defer to cardiology   Code Status: full Family Communication: d/w patient, RN (indicate person spoken with, relationship, and if by phone, the number) Disposition Plan: pend clinical; improvement    Consultants:  Cardiology   Procedures:  echo  Antibiotics: Azithromycin/ceftriaxone 12/16>>>   (indicate start date, and stop date if known)  HPI/Subjective: Alert, no distress   Objective: Filed Vitals:   09/02/15 0314 09/02/15 0755  BP: 105/86 109/81  Pulse: 71 108  Temp: 97.4 F (36.3 C) 97.9 F (36.6 C)  Resp: 13 15    Intake/Output Summary (Last 24 hours) at 09/02/15 0844 Last data filed at 09/02/15 0730  Gross per 24 hour  Intake    865 ml  Output   2000 ml  Net  -1135 ml   Filed Weights   08/31/15 1854 09/01/15 0300  Weight: 79.379 kg (175 lb) 83.9 kg (184 lb 15.5 oz)    Exam:   General:  No distress   Cardiovascular: s1,s2 rrr  Respiratory: few crackels LL  Abdomen: soft, nt,nd   Musculoskeletal: no led edema    Data Reviewed: Basic Metabolic Panel:  Recent Labs Lab 08/31/15 0025 08/31/15 1931 09/01/15 0424 09/02/15 0203  NA  --  138 138 139  K  --  4.5 4.3 4.3  CL  --  105 103 101  CO2  --  25 24 28   GLUCOSE  --  103* 186* 134*  BUN  --  15 15  26*  CREATININE  --  1.21 1.32* 1.39*  CALCIUM  --  9.3 9.0 8.9  MG 2.0  --   --   --   PHOS 3.3  --   --   --    Liver Function Tests:  Recent Labs Lab 09/01/15 0424 09/02/15 0203  AST 33 21  ALT 36 35  ALKPHOS 70 64  BILITOT 1.8* 0.9  PROT 6.4* 6.2*  ALBUMIN 3.6 3.3*   No results for input(s): LIPASE, AMYLASE in the last 168 hours. No results for input(s): AMMONIA in the last 168 hours. CBC:  Recent Labs Lab 08/31/15 1931 09/01/15 0424 09/01/15 0818 09/02/15 0203  WBC 16.3* 19.9* 19.1* 26.4*  NEUTROABS  --   --  18.2* 25.1*  HGB 18.9* 18.5* 19.1* 17.6*  HCT 59.4* 58.0* 59.5* 56.0*  MCV 86.2 86.1 85.6 85.8  PLT 797* 786* 855* 824*   Cardiac Enzymes:  Recent Labs Lab 09/01/15 0318 09/01/15 0818 09/01/15 1230 09/01/15 1853  TROPONINI 0.08* 0.12* 0.04* 0.03   BNP (last 3 results)  Recent Labs  08/31/15 1931  BNP 705.8*    ProBNP (last 3 results) No results for input(s): PROBNP in the last 8760 hours.  CBG: No results for input(s): GLUCAP in the last 168 hours.  Recent Results (from the past 240 hour(s))  MRSA PCR Screening     Status:  None   Collection Time: 09/01/15  2:09 AM  Result Value Ref Range Status   MRSA by PCR NEGATIVE NEGATIVE Final    Comment:        The GeneXpert MRSA Assay (FDA approved for NASAL specimens only), is one component of a comprehensive MRSA colonization surveillance program. It is not intended to diagnose MRSA infection nor to guide or monitor treatment for MRSA infections.      Studies: Dg Chest 2 View  08/31/2015  CLINICAL DATA:  SOB. Pt c/o SOB with exertion x 1 day. No other chest complaints. Pt currently on 2 liters of oxygen that he states helps. Hx ex-smoker as of 45 years ago and HTN controlled with medication. EXAM: CHEST  2 VIEW COMPARISON:  None. FINDINGS: Midline trachea. Mild cardiomegaly. Mediastinal contours otherwise within normal limits. No pleural effusion or pneumothorax. Thickening of the  fissures. Pulmonary interstitial prominence and indistinctness. Retrocardiac left lower lobe patchy airspace disease. IMPRESSION: Mild cardiomegaly with interstitial edema. Patchy left base airspace disease is suspicious for concurrent infection. Followup PA and lateral chest X-ray is recommended in 3-4 weeks following trial of antibiotic therapy to ensure resolution and exclude underlying malignancy. Electronically Signed   By: Abigail Miyamoto M.D.   On: 08/31/2015 19:56   Ct Angio Chest Pe W/cm &/or Wo Cm  08/31/2015  CLINICAL DATA:  Shortness of breath when lying flat. Elevated D-dimer. EXAM: CT ANGIOGRAPHY CHEST WITH CONTRAST TECHNIQUE: Multidetector CT imaging of the chest was performed using the standard protocol during bolus administration of intravenous contrast. Multiplanar CT image reconstructions and MIPs were obtained to evaluate the vascular anatomy. CONTRAST:  3mL OMNIPAQUE IOHEXOL 350 MG/ML SOLN COMPARISON:  Chest radiograph 08/31/2015 FINDINGS: Technically adequate study with good opacification of the central and segmental pulmonary arteries. No focal filling defects demonstrated. No evidence of significant pulmonary embolus. Diffuse cardiac enlargement. Normal caliber thoracic aorta. Lymph nodes demonstrated throughout the mediastinum and hilar regions are not pathologically enlarged and are likely reactive. Esophagus is decompressed. Evaluation of lungs is limited due to respiratory motion artifact. There are small bilateral pleural effusions. There is diffuse interstitial infiltration with airspace infiltration predominantly in the lung bases. This is likely due to edema although superimposed pneumonia is not excluded. Airways thickening suggesting bronchitis. No pneumothorax. Included portions of the upper abdominal organs are grossly unremarkable. Incidental note of a lipoma in the right anterior chest wall. Degenerative changes in the spine. Review of the MIP images confirms the above  findings. IMPRESSION: No evidence of significant pulmonary embolus. Cardiac enlargement with vascular congestion and diffuse interstitial edema. Airspace edema versus pneumonia in the lung bases. Bilateral pleural effusions. Peribronchial thickening suggesting bronchitis. Electronically Signed   By: Lucienne Capers M.D.   On: 08/31/2015 22:19    Scheduled Meds: . antiseptic oral rinse  7 mL Mouth Rinse BID  . aspirin  81 mg Oral Daily  . azithromycin  500 mg Intravenous Q24H  . cefTRIAXone (ROCEPHIN)  IV  1 g Intravenous Q24H  . enoxaparin (LOVENOX) injection  40 mg Subcutaneous Q24H  . furosemide  40 mg Intravenous Q12H  . guaiFENesin  600 mg Oral BID  . hydroxyurea  500 mg Oral BID  . methylPREDNISolone (SOLU-MEDROL) injection  60 mg Intravenous Q6H  . pantoprazole  40 mg Oral Daily  . pravastatin  20 mg Oral q1800  . sodium chloride  3 mL Intravenous Q12H  . tamsulosin  0.4 mg Oral Daily   Continuous Infusions: . diltiazem (CARDIZEM) infusion 10  mg/hr (09/02/15 0730)    Principal Problem:   CAP (community acquired pneumonia) Active Problems:   Hyperlipidemia   Thrombocytosis (HCC)   Polycythemia   LVH (left ventricular hypertrophy)   Essential hypertension, benign   Sinus tachycardia ([place])    Time spent: >35 minutes     Kinnie Feil  [facility] Pager [phone]. If 7PM-7AM, please contact night-coverage at [email or web address], password [facility] 09/02/2015, 8:44 AM  LOS: 2 days

## 2015-09-03 LAB — TSH: TSH: 0.887 u[IU]/mL (ref 0.350–4.500)

## 2015-09-03 LAB — COMPREHENSIVE METABOLIC PANEL
ALT: 39 U/L (ref 17–63)
ANION GAP: 11 (ref 5–15)
AST: 26 U/L (ref 15–41)
Albumin: 3.3 g/dL — ABNORMAL LOW (ref 3.5–5.0)
Alkaline Phosphatase: 74 U/L (ref 38–126)
BILIRUBIN TOTAL: 0.7 mg/dL (ref 0.3–1.2)
BUN: 28 mg/dL — AB (ref 6–20)
CALCIUM: 8.6 mg/dL — AB (ref 8.9–10.3)
CO2: 28 mmol/L (ref 22–32)
Chloride: 99 mmol/L — ABNORMAL LOW (ref 101–111)
Creatinine, Ser: 1.34 mg/dL — ABNORMAL HIGH (ref 0.61–1.24)
GFR calc Af Amer: 60 mL/min — ABNORMAL LOW (ref >60)
GFR, EST NON AFRICAN AMERICAN: 52 mL/min — AB (ref >60)
Glucose, Bld: 107 mg/dL — ABNORMAL HIGH (ref 65–99)
POTASSIUM: 3.6 mmol/L (ref 3.5–5.1)
Sodium: 138 mmol/L (ref 135–145)
TOTAL PROTEIN: 6.6 g/dL (ref 6.5–8.1)

## 2015-09-03 LAB — CBC WITH DIFFERENTIAL/PLATELET
Basophils Absolute: 0 10*3/uL (ref 0.0–0.1)
Basophils Relative: 0 %
EOS ABS: 0 10*3/uL (ref 0.0–0.7)
EOS PCT: 0 %
HEMATOCRIT: 56.8 % — AB (ref 39.0–52.0)
Hemoglobin: 18.2 g/dL — ABNORMAL HIGH (ref 13.0–17.0)
Lymphocytes Relative: 3 %
Lymphs Abs: 0.8 10*3/uL (ref 0.7–4.0)
MCH: 27.3 pg (ref 26.0–34.0)
MCHC: 32 g/dL (ref 30.0–36.0)
MCV: 85.3 fL (ref 78.0–100.0)
MONO ABS: 0.5 10*3/uL (ref 0.1–1.0)
Monocytes Relative: 2 %
Neutro Abs: 25.9 10*3/uL — ABNORMAL HIGH (ref 1.7–7.7)
Neutrophils Relative %: 95 %
Platelets: 867 10*3/uL — ABNORMAL HIGH (ref 150–400)
RBC: 6.66 MIL/uL — ABNORMAL HIGH (ref 4.22–5.81)
RDW: 17.5 % — AB (ref 11.5–15.5)
WBC: 27.1 10*3/uL — AB (ref 4.0–10.5)

## 2015-09-03 MED ORDER — SODIUM CHLORIDE 0.9 % IJ SOLN: 3.0000 mL | INTRAMUSCULAR | Status: DC | PRN

## 2015-09-03 MED ORDER — SODIUM CHLORIDE 0.9 % IJ SOLN
3.0000 mL | Freq: Two times a day (BID) | INTRAMUSCULAR | Status: DC
  Administered 2015-09-04 – 2015-09-05 (×3): 3 mL via INTRAVENOUS

## 2015-09-03 MED ORDER — SODIUM CHLORIDE 0.9 % IV SOLN: 250.0000 mL | INTRAVENOUS | Status: DC | PRN

## 2015-09-03 MED ORDER — FAMOTIDINE 20 MG PO TABS: 20.0000 mg | ORAL_TABLET | ORAL | Status: DC

## 2015-09-03 MED ORDER — PREDNISONE 10 MG PO TABS: 60.0000 mg | ORAL_TABLET | ORAL | Status: DC

## 2015-09-03 MED ORDER — DIPHENHYDRAMINE HCL 50 MG/ML IJ SOLN
25.0000 mg | INTRAMUSCULAR | Status: AC
  Administered 2015-09-04: 25 mg via INTRAVENOUS
  Filled 2015-09-03: qty 1

## 2015-09-03 MED ORDER — METHYLPREDNISOLONE SODIUM SUCC 40 MG IJ SOLR
40.0000 mg | Freq: Two times a day (BID) | INTRAMUSCULAR | Status: DC
  Administered 2015-09-03 – 2015-09-04 (×3): 40 mg via INTRAVENOUS
  Filled 2015-09-03 (×2): qty 1

## 2015-09-03 MED ORDER — PREDNISONE 50 MG PO TABS: 60.0000 mg | ORAL_TABLET | ORAL | Status: DC

## 2015-09-03 NOTE — Progress Notes (Signed)
Patient remains on room air with sats of 98%. Patient is in no distress. BIPAP is not needed at this time.

## 2015-09-03 NOTE — Progress Notes (Addendum)
[facility] PROGRESS NOTE  Dillon [name] [id number] DOB: January 01, 1946 DOA: 08/31/2015 PCP: Cathlean Cower, MD  Assessment/Plan: 69 y/o male with PMH of HTN, GERD, Polycythemia, CKD, presented with SOB, cough DOE -admitted with Pneumonia, LV dysfunction, CM  1. CAP. CT no PE. Patient is afebrile. Cont IV atx, pend cultures  -? Underlying COPD. ? Occupational. Added bronchodilators prn, steroids started on admission. Increasing white count likely secondary to steroids , slowly taper steroids  2. New LV dysfunction, CM. Mild acute systolic CHF. EF 15-20% -cont diuresis per cardiology. Likely needs LHC. Started on low-dose beta blocker Hold Lasix in anticipation of cardiac cath. Started on Mucinex  3. Elevated trop on admission. ? NSTEMI with typical symptoms. Cont ASA, statins, started on low-dose beta blocker. Cardiac cath in the morning  Code Status: full Family Communication: d/w patient, RN (indicate person spoken with, relationship, and if by phone, the number) Disposition Plan: As per cardiology   Consultants:  Cardiology   Procedures:  echo  Antibiotics: Azithromycin/ceftriaxone 12/16>>>   (indicate start date, and stop date if known)  HPI/Subjective: Feels much better. No chest pain. Breathing better  Objective: Filed Vitals:   09/03/15 0800 09/03/15 0818  BP:  128/96  Pulse: 80 82  Temp:  98.3 F (36.8 C)  Resp: 12 19    Intake/Output Summary (Last 24 hours) at 09/03/15 1113 Last data filed at 09/03/15 0857  Gross per 24 hour  Intake   1140 ml  Output   2750 ml  Net  -1610 ml   Filed Weights   08/31/15 1854 09/01/15 0300 09/03/15 0900  Weight: 79.379 kg (175 lb) 83.9 kg (184 lb 15.5 oz) 83.9 kg (184 lb 15.5 oz)    Exam:   General:  No distress   Cardiovascular: s1,s2 rrr  Respiratory: few crackels LL  Abdomen: soft, nt,nd   Musculoskeletal: no led edema    Data Reviewed: Basic Metabolic Panel:  Recent Labs Lab  08/31/15 0025 08/31/15 1931 09/01/15 0424 09/02/15 0203 09/03/15 0425  NA  --  138 138 139 138  K  --  4.5 4.3 4.3 3.6  CL  --  105 103 101 99*  CO2  --  25 24 28 28   GLUCOSE  --  103* 186* 134* 107*  BUN  --  15 15 26* 28*  CREATININE  --  1.21 1.32* 1.39* 1.34*  CALCIUM  --  9.3 9.0 8.9 8.6*  MG 2.0  --   --   --   --   PHOS 3.3  --   --   --   --    Liver Function Tests:  Recent Labs Lab 09/01/15 0424 09/02/15 0203 09/03/15 0425  AST 33 21 26  ALT 36 35 39  ALKPHOS 70 64 74  BILITOT 1.8* 0.9 0.7  PROT 6.4* 6.2* 6.6  ALBUMIN 3.6 3.3* 3.3*   No results for input(s): LIPASE, AMYLASE in the last 168 hours. No results for input(s): AMMONIA in the last 168 hours. CBC:  Recent Labs Lab 08/31/15 1931 09/01/15 0424 09/01/15 0818 09/02/15 0203 09/03/15 0425  WBC 16.3* 19.9* 19.1* 26.4* 27.1*  NEUTROABS  --   --  18.2* 25.1* 25.9*  HGB 18.9* 18.5* 19.1* 17.6* 18.2*  HCT 59.4* 58.0* 59.5* 56.0* 56.8*  MCV 86.2 86.1 85.6 85.8 85.3  PLT 797* 786* 855* 824* 867*   Cardiac Enzymes:  Recent Labs Lab 09/01/15 0318 09/01/15 0818 09/01/15 1230 09/01/15 1853  TROPONINI 0.08* 0.12* 0.04* 0.03  BNP (last 3 results)  Recent Labs  08/31/15 1931  BNP 705.8*    ProBNP (last 3 results) No results for input(s): PROBNP in the last 8760 hours.  CBG: No results for input(s): GLUCAP in the last 168 hours.  Recent Results (from the past 240 hour(s))  MRSA PCR Screening     Status: None   Collection Time: 09/01/15  2:09 AM  Result Value Ref Range Status   MRSA by PCR NEGATIVE NEGATIVE Final    Comment:        The GeneXpert MRSA Assay (FDA approved for NASAL specimens only), is one component of a comprehensive MRSA colonization surveillance program. It is not intended to diagnose MRSA infection nor to guide or monitor treatment for MRSA infections.   Culture, blood (routine x 2) Call MD if unable to obtain prior to antibiotics being given     Status: None  (Preliminary result)   Collection Time: 09/01/15  4:24 AM  Result Value Ref Range Status   Specimen Description BLOOD RIGHT ARM  Final   Special Requests IN PEDIATRIC BOTTLE 5ML  Final   Culture NO GROWTH 2 DAYS  Final   Report Status PENDING  Incomplete  Culture, blood (routine x 2) Call MD if unable to obtain prior to antibiotics being given     Status: None (Preliminary result)   Collection Time: 09/01/15  4:31 AM  Result Value Ref Range Status   Specimen Description BLOOD RIGHT ANTECUBITAL  Final   Special Requests BOTTLES DRAWN AEROBIC AND ANAEROBIC 5ML  Final   Culture NO GROWTH 2 DAYS  Final   Report Status PENDING  Incomplete     Studies: No results found.  Scheduled Meds: . antiseptic oral rinse  7 mL Mouth Rinse BID  . aspirin  81 mg Oral Daily  . azithromycin  500 mg Intravenous Q24H  . carvedilol  3.125 mg Oral BID WC  . cefTRIAXone (ROCEPHIN)  IV  1 g Intravenous Q24H  . enoxaparin (LOVENOX) injection  40 mg Subcutaneous Q24H  . guaiFENesin  600 mg Oral BID  . hydroxyurea  500 mg Oral BID  . methylPREDNISolone (SOLU-MEDROL) injection  40 mg Intravenous Q6H  . pantoprazole  40 mg Oral Daily  . pravastatin  20 mg Oral q1800  . sodium chloride  3 mL Intravenous Q12H  . tamsulosin  0.4 mg Oral Daily   Continuous Infusions:    Principal Problem:   CAP (community acquired pneumonia) Active Problems:   Hyperlipidemia   Thrombocytosis (HCC)   Polycythemia   LVH (left ventricular hypertrophy)   Essential hypertension, benign   Sinus tachycardia (HCC)   Acute systolic HF (heart failure) ([place])    Time spent: >35 minutes     [facility] Pager [phone]. If 7PM-7AM, please contact night-coverage at [email or web address], password [facility] 09/03/2015, 11:13 AM  LOS: 3 days

## 2015-09-03 NOTE — Progress Notes (Signed)
Marland Kitchen    SUBJECTIVE:  Feels much better.  No chest pain.  Breathing better   PHYSICAL EXAM Filed Vitals:   09/03/15 0400 09/03/15 0800 09/03/15 0818 09/03/15 0900  BP:   128/96   Pulse: 72 80 82   Temp:   98.3 F (36.8 C)   TempSrc:   Oral   Resp: 13 12 19    Height:    5\' 9"  (1.753 m)  Weight:    184 lb 15.5 oz (83.9 kg)  SpO2: 91% 96% 96%    General:  No distress Lungs:  Clear Heart:  RRR Abdomen:  Positive bowel sounds, no rebound no guarding Extremities:  No edema Neck:  No JVD Neuro: Non focal  LABS: Lab Results  Component Value Date   TROPONINI 0.03 09/01/2015   Results for orders placed or performed during the hospital encounter of 08/31/15 (from the past 24 hour(s))  CBC WITH DIFFERENTIAL     Status: Abnormal   Collection Time: 09/03/15  4:25 AM  Result Value Ref Range   WBC 27.1 (H) 4.0 - 10.5 K/uL   RBC 6.66 (H) 4.22 - 5.81 MIL/uL   Hemoglobin 18.2 (H) 13.0 - 17.0 g/dL   HCT 56.8 (H) 39.0 - 52.0 %   MCV 85.3 78.0 - 100.0 fL   MCH 27.3 26.0 - 34.0 pg   MCHC 32.0 30.0 - 36.0 g/dL   RDW 17.5 (H) 11.5 - 15.5 %   Platelets 867 (H) 150 - 400 K/uL   Neutrophils Relative % 95 %   Neutro Abs 25.9 (H) 1.7 - 7.7 K/uL   Lymphocytes Relative 3 %   Lymphs Abs 0.8 0.7 - 4.0 K/uL   Monocytes Relative 2 %   Monocytes Absolute 0.5 0.1 - 1.0 K/uL   Eosinophils Relative 0 %   Eosinophils Absolute 0.0 0.0 - 0.7 K/uL   Basophils Relative 0 %   Basophils Absolute 0.0 0.0 - 0.1 K/uL  Comprehensive metabolic panel     Status: Abnormal   Collection Time: 09/03/15  4:25 AM  Result Value Ref Range   Sodium 138 135 - 145 mmol/L   Potassium 3.6 3.5 - 5.1 mmol/L   Chloride 99 (L) 101 - 111 mmol/L   CO2 28 22 - 32 mmol/L   Glucose, Bld 107 (H) 65 - 99 mg/dL   BUN 28 (H) 6 - 20 mg/dL   Creatinine, Ser 1.34 (H) 0.61 - 1.24 mg/dL   Calcium 8.6 (L) 8.9 - 10.3 mg/dL   Total Protein 6.6 6.5 - 8.1 g/dL   Albumin 3.3 (L) 3.5 - 5.0 g/dL   AST 26 15 - 41 U/L   ALT 39 17 - 63 U/L   Alkaline Phosphatase 74 38 - 126 U/L   Total Bilirubin 0.7 0.3 - 1.2 mg/dL   GFR calc non Af Amer 52 (L) >60 mL/min   GFR calc Af Amer 60 (L) >60 mL/min   Anion gap 11 5 - 15  TSH     Status: None   Collection Time: 09/03/15  4:25 AM  Result Value Ref Range   TSH 0.887 0.350 - 4.500 uIU/mL    Intake/Output Summary (Last 24 hours) at 09/03/15 1005 Last data filed at 09/03/15 0857  Gross per 24 hour  Intake 1365.41 ml  Output   2750 ml  Net -1384.59 ml    ECHO:   - Left ventricle: LVEF is severely depressed at approximately 15 to 20% The cavity size was severely dilated. Wall thickness was  normal. Doppler parameters are consistent with a reversible restrictive pattern, indicative of decreased left ventricular diastolic compliance and/or increased left atrial pressure (grade 3 diastolic dysfunction). - Mitral valve: There was mild regurgitation. - Left atrium: The atrium was severely dilated. - Right atrium: The atrium was severely dilated.  ASSESSMENT AND PLAN:  CARDIOMYOPATHY:  EF is as above.   Doubt ischemia but he will need a cardiac cath to rule out CAD.   Started on low dose beta blocker.   Creat is up slightly.  He does not need an LV gram but only to cross for diastolic pressure.  Of note he does not recall an allergy to dye but it is listed.  I will prophylax.    ELEVATED TROPONIN:   Plan cath as above.     PULMONARY EDEMA:  Manage as above.   Hold Lasix in anticipation of cath.    HTN:  BP is improved.  Might be able to add ACE inhibitor/ARB before discharge.  However, I will hold on this for now in anticipation of cath and with slightly increased cath.      POLYCYTHEMIA   Minus Breeding 09/03/2015 10:05 AM

## 2015-09-03 NOTE — Progress Notes (Signed)
Patient having frequent episodes of SVT with HR up to 130's to 165.  Pt. Is asymptomatic at this time.  Episodes lasting approx. 5-6 seconds before converting back to SR.

## 2015-09-04 ENCOUNTER — Telehealth: Payer: Self-pay | Admitting: Cardiology

## 2015-09-04 DIAGNOSIS — I471 Supraventricular tachycardia: Secondary | ICD-10-CM

## 2015-09-04 LAB — CBC WITH DIFFERENTIAL/PLATELET
BASOS ABS: 0 10*3/uL (ref 0.0–0.1)
BASOS PCT: 0 %
EOS ABS: 0 10*3/uL (ref 0.0–0.7)
EOS PCT: 0 %
HCT: 58.3 % — ABNORMAL HIGH (ref 39.0–52.0)
Hemoglobin: 18.8 g/dL — ABNORMAL HIGH (ref 13.0–17.0)
Lymphocytes Relative: 2 %
Lymphs Abs: 0.6 10*3/uL — ABNORMAL LOW (ref 0.7–4.0)
MCH: 27.4 pg (ref 26.0–34.0)
MCHC: 32.2 g/dL (ref 30.0–36.0)
MCV: 85.1 fL (ref 78.0–100.0)
MONO ABS: 1 10*3/uL (ref 0.1–1.0)
Monocytes Relative: 4 %
NEUTROS ABS: 25.9 10*3/uL — AB (ref 1.7–7.7)
Neutrophils Relative %: 94 %
PLATELETS: 912 10*3/uL — AB (ref 150–400)
RBC: 6.85 MIL/uL — ABNORMAL HIGH (ref 4.22–5.81)
RDW: 17.8 % — AB (ref 11.5–15.5)
WBC: 27.5 10*3/uL — ABNORMAL HIGH (ref 4.0–10.5)

## 2015-09-04 LAB — COMPREHENSIVE METABOLIC PANEL
ALBUMIN: 3.1 g/dL — AB (ref 3.5–5.0)
ALT: 39 U/L (ref 17–63)
AST: 40 U/L (ref 15–41)
Alkaline Phosphatase: 68 U/L (ref 38–126)
Anion gap: 10 (ref 5–15)
BUN: 36 mg/dL — AB (ref 6–20)
CHLORIDE: 100 mmol/L — AB (ref 101–111)
CO2: 25 mmol/L (ref 22–32)
Calcium: 8 mg/dL — ABNORMAL LOW (ref 8.9–10.3)
Creatinine, Ser: 1.44 mg/dL — ABNORMAL HIGH (ref 0.61–1.24)
GFR calc Af Amer: 55 mL/min — ABNORMAL LOW (ref >60)
GFR calc non Af Amer: 47 mL/min — ABNORMAL LOW (ref >60)
GLUCOSE: 174 mg/dL — AB (ref 65–99)
POTASSIUM: 4.6 mmol/L (ref 3.5–5.1)
SODIUM: 135 mmol/L (ref 135–145)
Total Bilirubin: 1 mg/dL (ref 0.3–1.2)
Total Protein: 5.3 g/dL — ABNORMAL LOW (ref 6.5–8.1)

## 2015-09-04 LAB — LEGIONELLA ANTIGEN, URINE

## 2015-09-04 LAB — PROTIME-INR
INR: 1.32 (ref 0.00–1.49)
Prothrombin Time: 16.5 seconds — ABNORMAL HIGH (ref 11.6–15.2)

## 2015-09-04 MED ORDER — METHYLPREDNISOLONE SODIUM SUCC 125 MG IJ SOLR
60.0000 mg | Freq: Two times a day (BID) | INTRAMUSCULAR | Status: DC
  Administered 2015-09-04 – 2015-09-06 (×5): 60 mg via INTRAVENOUS
  Filled 2015-09-04 (×5): qty 2

## 2015-09-04 MED ORDER — METOPROLOL TARTRATE 1 MG/ML IV SOLN
5.0000 mg | INTRAVENOUS | Status: AC | PRN
  Administered 2015-09-04 (×3): 5 mg via INTRAVENOUS
  Filled 2015-09-04 (×3): qty 5

## 2015-09-04 MED ORDER — PREDNISONE 50 MG PO TABS: 60.0000 mg | ORAL_TABLET | Freq: Every day | ORAL | Status: DC

## 2015-09-04 MED ORDER — CARVEDILOL 6.25 MG PO TABS
6.2500 mg | ORAL_TABLET | Freq: Two times a day (BID) | ORAL | Status: DC
  Administered 2015-09-04: 6.25 mg via ORAL
  Filled 2015-09-04: qty 1

## 2015-09-04 NOTE — Progress Notes (Signed)
[facility] PROGRESS NOTE  Dillon [name] [id number] DOB: 1945-10-11 DOA: 08/31/2015 PCP: Cathlean Cower, MD  Assessment/Plan: 69 y/o male with PMH of HTN, GERD, Polycythemia, CKD, presented with SOB, cough DOE -admitted with Pneumonia, LV dysfunction, CM  1. Probable CAP. CT no PE. Patient is afebrile. Cont IV atx, blood culture no growth so far  -? Underlying COPD.  Marland Kitchen Continue bronchodilators prn, steroids started on admission. Increasing white count likely secondary to steroids ,    2. New LV dysfunction, CM. Mild acute systolic CHF. EF 15-20% -cont diuresis per cardiology. Likely needs LHC. Started on low-dose beta blocker Hold Lasix /ACE inhibitor in anticipation of cardiac cath. Started on Mucinex as creatinine is slightly up,  started on Pepcid, Benadryl, Solu-Medrol as a part of pre-cath orders suspected allergy to IV dye  3. Elevated trop on admission. ? NSTEMI with typical symptoms. Cont ASA, statins, started on low-dose beta blocker. Cardiac cath in the morning  4. PULMONARY EDEMA: Manage as above. Hold Lasix in anticipation of cath.   Code Status: full Family Communication: d/w patient, RN (indicate person spoken with, relationship, and if by phone, the number) Disposition Plan:cardiac cath tomorrow   Consultants:  Cardiology   Procedures:  echo  Antibiotics: Azithromycin/ceftriaxone 12/16>>>   (indicate start date, and stop date if known)  HPI/Subjective: Feels much better. No chest pain. Breathing better  Objective: Filed Vitals:   09/04/15 0900 09/04/15 1155  BP: 128/87 127/102  Pulse:  86  Temp:  98.3 F (36.8 C)  Resp: 19 15    Intake/Output Summary (Last 24 hours) at 09/04/15 1225 Last data filed at 09/04/15 1000  Gross per 24 hour  Intake    993 ml  Output   1775 ml  Net   -782 ml   Filed Weights   08/31/15 1854 09/01/15 0300 09/03/15 0900  Weight: 79.379 kg (175 lb) 83.9 kg (184 lb 15.5 oz) 83.9 kg (184 lb 15.5 oz)     Exam:   General:  No distress   Cardiovascular: s1,s2 rrr  Respiratory: few crackels LL  Abdomen: soft, nt,nd   Musculoskeletal: no led edema    Data Reviewed: Basic Metabolic Panel:  Recent Labs Lab 08/31/15 0025 08/31/15 1931 09/01/15 0424 09/02/15 0203 09/03/15 0425 09/04/15 0238  NA  --  138 138 139 138 135  K  --  4.5 4.3 4.3 3.6 4.6  CL  --  105 103 101 99* 100*  CO2  --  25 24 28 28 25   GLUCOSE  --  103* 186* 134* 107* 174*  BUN  --  15 15 26* 28* 36*  CREATININE  --  1.21 1.32* 1.39* 1.34* 1.44*  CALCIUM  --  9.3 9.0 8.9 8.6* 8.0*  MG 2.0  --   --   --   --   --   PHOS 3.3  --   --   --   --   --    Liver Function Tests:  Recent Labs Lab 09/01/15 0424 09/02/15 0203 09/03/15 0425 09/04/15 0238  AST 33 21 26 40  ALT 36 35 39 39  ALKPHOS 70 64 74 68  BILITOT 1.8* 0.9 0.7 1.0  PROT 6.4* 6.2* 6.6 5.3*  ALBUMIN 3.6 3.3* 3.3* 3.1*   No results for input(s): LIPASE, AMYLASE in the last 168 hours. No results for input(s): AMMONIA in the last 168 hours. CBC:  Recent Labs Lab 09/01/15 0424 09/01/15 0818 09/02/15 0203 09/03/15 0425 09/04/15 0238  WBC  19.9* 19.1* 26.4* 27.1* 27.5*  NEUTROABS  --  18.2* 25.1* 25.9* 25.9*  HGB 18.5* 19.1* 17.6* 18.2* 18.8*  HCT 58.0* 59.5* 56.0* 56.8* 58.3*  MCV 86.1 85.6 85.8 85.3 85.1  PLT 786* 855* 824* 867* 912*   Cardiac Enzymes:  Recent Labs Lab 09/01/15 0318 09/01/15 0818 09/01/15 1230 09/01/15 1853  TROPONINI 0.08* 0.12* 0.04* 0.03   BNP (last 3 results)  Recent Labs  08/31/15 1931  BNP 705.8*    ProBNP (last 3 results) No results for input(s): PROBNP in the last 8760 hours.  CBG: No results for input(s): GLUCAP in the last 168 hours.  Recent Results (from the past 240 hour(s))  MRSA PCR Screening     Status: None   Collection Time: 09/01/15  2:09 AM  Result Value Ref Range Status   MRSA by PCR NEGATIVE NEGATIVE Final    Comment:        The GeneXpert MRSA Assay (FDA approved for  NASAL specimens only), is one component of a comprehensive MRSA colonization surveillance program. It is not intended to diagnose MRSA infection nor to guide or monitor treatment for MRSA infections.   Culture, blood (routine x 2) Call MD if unable to obtain prior to antibiotics being given     Status: None (Preliminary result)   Collection Time: 09/01/15  4:24 AM  Result Value Ref Range Status   Specimen Description BLOOD RIGHT ARM  Final   Special Requests IN PEDIATRIC BOTTLE 5ML  Final   Culture NO GROWTH 2 DAYS  Final   Report Status PENDING  Incomplete  Culture, blood (routine x 2) Call MD if unable to obtain prior to antibiotics being given     Status: None (Preliminary result)   Collection Time: 09/01/15  4:31 AM  Result Value Ref Range Status   Specimen Description BLOOD RIGHT ANTECUBITAL  Final   Special Requests BOTTLES DRAWN AEROBIC AND ANAEROBIC 5ML  Final   Culture NO GROWTH 2 DAYS  Final   Report Status PENDING  Incomplete     Studies: No results found.  Scheduled Meds: . antiseptic oral rinse  7 mL Mouth Rinse BID  . aspirin  81 mg Oral Daily  . azithromycin  500 mg Intravenous Q24H  . carvedilol  6.25 mg Oral BID WC  . cefTRIAXone (ROCEPHIN)  IV  1 g Intravenous Q24H  . diphenhydrAMINE  25 mg Intravenous Pre-Cath  . enoxaparin (LOVENOX) injection  40 mg Subcutaneous Q24H  . famotidine  20 mg Oral Pre-Cath  . guaiFENesin  600 mg Oral BID  . hydroxyurea  500 mg Oral BID  . methylPREDNISolone (SOLU-MEDROL) injection  60 mg Intravenous Q12H  . pantoprazole  40 mg Oral Daily  . pravastatin  20 mg Oral q1800  . sodium chloride  3 mL Intravenous Q12H  . sodium chloride  3 mL Intravenous Q12H  . tamsulosin  0.4 mg Oral Daily   Continuous Infusions:    Principal Problem:   CAP (community acquired pneumonia) Active Problems:   Hyperlipidemia   Thrombocytosis (HCC)   Polycythemia   LVH (left ventricular hypertrophy)   Essential hypertension, benign    Sinus tachycardia (HCC)   Acute systolic HF (heart failure) ([place])    Time spent: >35 minutes     [facility] Pager [phone]. If 7PM-7AM, please contact night-coverage at [email or web address], password [facility] 09/04/2015, 12:25 PM  LOS: 4 days

## 2015-09-04 NOTE — Care Management Important Message (Signed)
Important Message  Patient Details  Name: Dillon [name] MRN: [id number] Date of Birth: 15-Jan-1946   Medicare Important Message Given:  Yes    Arionne Iams P Anice Wilshire 09/04/2015, 3:03 PM

## 2015-09-04 NOTE — Telephone Encounter (Signed)
Error

## 2015-09-04 NOTE — Progress Notes (Signed)
New: Dr. Claiborne Billings Subjective:  Dillon [name] is a 69 y.o. male with a history of hypertension, hyperlipidemia, polycythemia, thrombocytosis, GERD, former smoker (quit 1972) and LVH who came to [facility] ED 08/31/15 for evaluation of shortness of breath and chest pain.  Does not feel heart race. No CP, no SOB.   Objective:  Vital Signs in the last 24 hours: Temp:  [97.4 F (36.3 C)-98.5 F (36.9 C)] 98.1 F (36.7 C) (12/19 0726) Pulse Rate:  [89-161] 105 (12/19 0855) Resp:  [15-21] 15 (12/19 0726) BP: (107-128)/(81-92) 128/87 mmHg (12/19 0855) SpO2:  [95 %-98 %] 98 % (12/19 0726)  Intake/Output from previous day: 12/18 0701 - 12/19 0700 In: 1223 [P.O.:920; I.V.:3; IV Piggyback:300] Out: 2150 [Urine:2150]   Physical Exam: General: Well developed, well nourished, in no acute distress. Head:  Normocephalic and atraumatic. Lungs: Clear to auscultation and percussion. Heart: Normal S1 and S2.  No murmur, rubs or gallops. Ectopy noted.  Abdomen: soft, non-tender, positive bowel sounds. Extremities: No clubbing or cyanosis. No edema. Neurologic: Alert and oriented x 3.    Lab Results:  Recent Labs  09/03/15 0425 09/04/15 0238  WBC 27.1* 27.5*  HGB 18.2* 18.8*  PLT 867* 912*    Recent Labs  09/03/15 0425 09/04/15 0238  NA 138 135  K 3.6 4.6  CL 99* 100*  CO2 28 25  GLUCOSE 107* 174*  BUN 28* 36*  CREATININE 1.34* 1.44*    Recent Labs  09/01/15 1230 09/01/15 1853  TROPONINI 0.04* 0.03   Hepatic Function Panel  Recent Labs  09/04/15 0238  PROT 5.3*  ALBUMIN 3.1*  AST 40  ALT 39  ALKPHOS 68  BILITOT 1.0    Telemetry: PAT. NSR Personally viewed.   EKG:  LVH, NSSTW changes, repol abn.  Personally viewed.  Cardiac Studies:  EF 15-20%  Meds: Scheduled Meds: . antiseptic oral rinse  7 mL Mouth Rinse BID  . aspirin  81 mg Oral Daily  . azithromycin  500 mg Intravenous Q24H  . carvedilol  3.125 mg Oral BID WC  . cefTRIAXone  (ROCEPHIN)  IV  1 g Intravenous Q24H  . diphenhydrAMINE  25 mg Intravenous Pre-Cath  . enoxaparin (LOVENOX) injection  40 mg Subcutaneous Q24H  . famotidine  20 mg Oral Pre-Cath  . guaiFENesin  600 mg Oral BID  . hydroxyurea  500 mg Oral BID  . methylPREDNISolone (SOLU-MEDROL) injection  40 mg Intravenous Q12H  . pantoprazole  40 mg Oral Daily  . pravastatin  20 mg Oral q1800  . sodium chloride  3 mL Intravenous Q12H  . sodium chloride  3 mL Intravenous Q12H  . tamsulosin  0.4 mg Oral Daily   Continuous Infusions:  PRN Meds:.sodium chloride, sodium chloride, levalbuterol, ondansetron **OR** ondansetron (ZOFRAN) IV, sodium chloride, sodium chloride  Assessment/Plan:  Principal Problem:   CAP (community acquired pneumonia) Active Problems:   Hyperlipidemia   Thrombocytosis (HCC)   Polycythemia   LVH (left ventricular hypertrophy)   Essential hypertension, benign   Sinus tachycardia (HCC)   Acute systolic HF (heart failure) (HCC)  Acute systolic heart failure  - cath to define anatomy  - increase coreg to 6.25 BID (help with PAT)   - ? Prior IV dye allergy - Pepcid, Ben. Per Dr. Percival Spanish history  - Holding lasix  - Hopeful ACE prior to DC. Holding cath  Demand ischemia  - low level troponin likely secondary to CHF  Polycythemia  - chronic   - meds  noted  - WBC from 19 to 27  - PLT from 855 to 912  - per primary team.   Hyperlipidemia  - Pravastatin  CAP  - abx noted per primary team.   Marlou Porch, [place] 09/04/2015, 10:10 AM

## 2015-09-04 NOTE — Progress Notes (Signed)
Patient remaining on room air in no distress with sats of 98%. BIPAP is not needed at this time.

## 2015-09-04 NOTE — Progress Notes (Signed)
    Informed from cath lab that we are unable to perform cath today due to excess volume.   Will postpone until tomorrow.  Having SVT 160's Will give IV lopressor 5mg  x3 PRN  Candee Furbish, MD

## 2015-09-05 ENCOUNTER — Encounter (HOSPITAL_COMMUNITY)
Admission: EM | Disposition: A | Discharge status: Home / Self Care | Payer: Self-pay | Source: Home / Self Care | Attending: Internal Medicine

## 2015-09-05 DIAGNOSIS — I429 Cardiomyopathy, unspecified: Secondary | ICD-10-CM

## 2015-09-05 DIAGNOSIS — I428 Other cardiomyopathies: Secondary | ICD-10-CM | POA: Insufficient documentation

## 2015-09-05 HISTORY — PX: CARDIAC CATHETERIZATION: SHX172

## 2015-09-05 LAB — COMPREHENSIVE METABOLIC PANEL
ALBUMIN: 3.1 g/dL — AB (ref 3.5–5.0)
ALK PHOS: 71 U/L (ref 38–126)
ALT: 51 U/L (ref 17–63)
AST: 36 U/L (ref 15–41)
Anion gap: 13 (ref 5–15)
BILIRUBIN TOTAL: 0.7 mg/dL (ref 0.3–1.2)
BUN: 36 mg/dL — AB (ref 6–20)
CO2: 22 mmol/L (ref 22–32)
CREATININE: 1.36 mg/dL — AB (ref 0.61–1.24)
Calcium: 8.4 mg/dL — ABNORMAL LOW (ref 8.9–10.3)
Chloride: 103 mmol/L (ref 101–111)
GFR calc Af Amer: 59 mL/min — ABNORMAL LOW (ref >60)
GFR calc non Af Amer: 51 mL/min — ABNORMAL LOW (ref >60)
GLUCOSE: 166 mg/dL — AB (ref 65–99)
POTASSIUM: 4.8 mmol/L (ref 3.5–5.1)
Sodium: 138 mmol/L (ref 135–145)
TOTAL PROTEIN: 5.6 g/dL — AB (ref 6.5–8.1)

## 2015-09-05 LAB — CBC WITH DIFFERENTIAL/PLATELET
BASOS ABS: 0 10*3/uL (ref 0.0–0.1)
Basophils Relative: 0 %
Eosinophils Absolute: 0 10*3/uL (ref 0.0–0.7)
Eosinophils Relative: 0 %
HEMATOCRIT: 59.7 % — AB (ref 39.0–52.0)
HEMOGLOBIN: 19.6 g/dL — AB (ref 13.0–17.0)
LYMPHS ABS: 0.6 10*3/uL — AB (ref 0.7–4.0)
LYMPHS PCT: 2 %
MCH: 27.6 pg (ref 26.0–34.0)
MCHC: 32.8 g/dL (ref 30.0–36.0)
MCV: 84.1 fL (ref 78.0–100.0)
Monocytes Absolute: 1.2 10*3/uL — ABNORMAL HIGH (ref 0.1–1.0)
Monocytes Relative: 5 %
NEUTROS ABS: 24 10*3/uL — AB (ref 1.7–7.7)
NEUTROS PCT: 93 %
Platelets: 930 10*3/uL (ref 150–400)
RBC: 7.1 MIL/uL — AB (ref 4.22–5.81)
RDW: 17.4 % — AB (ref 11.5–15.5)
WBC: 25.7 10*3/uL — AB (ref 4.0–10.5)

## 2015-09-05 LAB — CBC
HEMATOCRIT: 61.6 % — AB (ref 39.0–52.0)
HEMOGLOBIN: 19.9 g/dL — AB (ref 13.0–17.0)
MCH: 27.7 pg (ref 26.0–34.0)
MCHC: 32.3 g/dL (ref 30.0–36.0)
MCV: 85.8 fL (ref 78.0–100.0)
Platelets: 1060 10*3/uL (ref 150–400)
RBC: 7.18 MIL/uL — ABNORMAL HIGH (ref 4.22–5.81)
RDW: 18.3 % — AB (ref 11.5–15.5)
WBC: 26.1 10*3/uL — AB (ref 4.0–10.5)

## 2015-09-05 LAB — CREATININE, SERUM
Creatinine, Ser: 1.29 mg/dL — ABNORMAL HIGH (ref 0.61–1.24)
GFR, EST NON AFRICAN AMERICAN: 54 mL/min — AB (ref >60)

## 2015-09-05 LAB — PATHOLOGIST SMEAR REVIEW

## 2015-09-05 LAB — LACTATE DEHYDROGENASE: LDH: 372 U/L — ABNORMAL HIGH (ref 98–192)

## 2015-09-05 SURGERY — LEFT HEART CATH AND CORONARY ANGIOGRAPHY

## 2015-09-05 MED ORDER — SODIUM CHLORIDE 0.9 % IJ SOLN: 3.0000 mL | INTRAMUSCULAR | Status: DC | PRN

## 2015-09-05 MED ORDER — ONDANSETRON HCL 4 MG/2ML IJ SOLN: 4.0000 mg | Freq: Four times a day (QID) | INTRAMUSCULAR | Status: DC | PRN

## 2015-09-05 MED ORDER — SODIUM CHLORIDE 0.9 % IV SOLN
INTRAVENOUS | Status: AC
  Administered 2015-09-05: 15:00:00 via INTRAVENOUS

## 2015-09-05 MED ORDER — LIDOCAINE HCL (PF) 1 % IJ SOLN
INTRAMUSCULAR | Status: AC
  Filled 2015-09-05: qty 30

## 2015-09-05 MED ORDER — FENTANYL CITRATE (PF) 100 MCG/2ML IJ SOLN
INTRAMUSCULAR | Status: DC | PRN
  Administered 2015-09-05: 25 ug via INTRAVENOUS

## 2015-09-05 MED ORDER — FENTANYL CITRATE (PF) 100 MCG/2ML IJ SOLN
INTRAMUSCULAR | Status: AC
  Filled 2015-09-05: qty 2

## 2015-09-05 MED ORDER — VERAPAMIL HCL 2.5 MG/ML IV SOLN
INTRA_ARTERIAL | Status: DC | PRN
  Administered 2015-09-05: 15:00:00 via INTRA_ARTERIAL

## 2015-09-05 MED ORDER — ASPIRIN EC 81 MG PO TBEC
81.0000 mg | DELAYED_RELEASE_TABLET | Freq: Every day | ORAL | Status: DC
  Administered 2015-09-06: 81 mg via ORAL
  Filled 2015-09-05: qty 1

## 2015-09-05 MED ORDER — NITROGLYCERIN 1 MG/10 ML FOR IR/CATH LAB
INTRA_ARTERIAL | Status: DC | PRN
  Administered 2015-09-05: 15:00:00

## 2015-09-05 MED ORDER — HEPARIN (PORCINE) IN NACL 2-0.9 UNIT/ML-% IJ SOLN
INTRAMUSCULAR | Status: AC
  Filled 2015-09-05: qty 1000

## 2015-09-05 MED ORDER — SODIUM CHLORIDE 0.9 % IV SOLN: 250.0000 mL | INTRAVENOUS | Status: DC | PRN

## 2015-09-05 MED ORDER — ACETAMINOPHEN 325 MG PO TABS: 650.0000 mg | ORAL_TABLET | ORAL | Status: DC | PRN

## 2015-09-05 MED ORDER — ENOXAPARIN SODIUM 40 MG/0.4ML ~~LOC~~ SOLN
40.0000 mg | SUBCUTANEOUS | Status: DC
  Administered 2015-09-06: 40 mg via SUBCUTANEOUS
  Filled 2015-09-05: qty 0.4

## 2015-09-05 MED ORDER — CARVEDILOL 12.5 MG PO TABS
12.5000 mg | ORAL_TABLET | Freq: Two times a day (BID) | ORAL | Status: DC
  Administered 2015-09-05 – 2015-09-06 (×2): 12.5 mg via ORAL
  Filled 2015-09-05 (×2): qty 1

## 2015-09-05 MED ORDER — MIDAZOLAM HCL 2 MG/2ML IJ SOLN
INTRAMUSCULAR | Status: AC
  Filled 2015-09-05: qty 2

## 2015-09-05 MED ORDER — NITROGLYCERIN 1 MG/10 ML FOR IR/CATH LAB
INTRA_ARTERIAL | Status: AC
  Filled 2015-09-05: qty 10

## 2015-09-05 MED ORDER — MIDAZOLAM HCL 2 MG/2ML IJ SOLN
INTRAMUSCULAR | Status: DC | PRN
  Administered 2015-09-05 (×2): 1 mg via INTRAVENOUS

## 2015-09-05 MED ORDER — HEPARIN SODIUM (PORCINE) 1000 UNIT/ML IJ SOLN
INTRAMUSCULAR | Status: DC | PRN
  Administered 2015-09-05: 4000 [IU] via INTRAVENOUS

## 2015-09-05 MED ORDER — SODIUM CHLORIDE 0.9 % IV SOLN
INTRAVENOUS | Status: DC
  Administered 2015-09-05: 08:00:00 via INTRAVENOUS

## 2015-09-05 MED ORDER — SODIUM CHLORIDE 0.9 % IJ SOLN
3.0000 mL | Freq: Two times a day (BID) | INTRAMUSCULAR | Status: DC
  Administered 2015-09-05 – 2015-09-06 (×2): 3 mL via INTRAVENOUS

## 2015-09-05 SURGICAL SUPPLY — 10 items
CATH INFINITI 5FR ANG PIGTAIL (CATHETERS) ×3 IMPLANT
CATH OPTITORQUE TIG 4.0 5F (CATHETERS) ×3 IMPLANT
DEVICE RAD COMP TR BAND LRG (VASCULAR PRODUCTS) ×3 IMPLANT
GLIDESHEATH SLEND SS 6F .021 (SHEATH) ×2 IMPLANT
KIT HEART LEFT (KITS) ×3 IMPLANT
PACK CARDIAC CATHETERIZATION (CUSTOM PROCEDURE TRAY) ×3 IMPLANT
SYR MEDRAD MARK V 150ML (SYRINGE) ×3 IMPLANT
TRANSDUCER W/STOPCOCK (MISCELLANEOUS) ×3 IMPLANT
TUBING CIL FLEX 10 FLL-RA (TUBING) ×3 IMPLANT
WIRE SAFE-T 1.5MM-J .035X260CM (WIRE) ×3 IMPLANT

## 2015-09-05 NOTE — Progress Notes (Signed)
TR Band site was bleeding with 37ml of air left. Inflated with 88ml of air. After 30 min started deflating  30ml of air every 15 min. Last  air deflated at  6:30 pm. To leave tr band for 1 hour. Continue to monitor for bleeding.

## 2015-09-05 NOTE — Progress Notes (Signed)
R radial TR band with bleeding at site, no hematoma. Band is on but deflated. 3 cc air placed with bleeding stopped. Patient acknowledged to keep arm still and elevated on pillow, to call if sees any bleeding.

## 2015-09-05 NOTE — Progress Notes (Signed)
[facility] PROGRESS NOTE  Dillon [name] [id number] DOB: 10-16-1945 DOA: 08/31/2015 PCP: Cathlean Cower, MD  Assessment/Plan: 69 y/o male with PMH of HTN, GERD, Polycythemia, CKD, presented with SOB, cough DOE -admitted with Pneumonia, LV dysfunction, CM  1. Probable CAP. CT no PE. Patient is afebrile. Cont IV atx, blood culture no growth so far  -? Underlying COPD.  Marland Kitchen Continue bronchodilators prn, steroids started on admission. Increasing white count likely secondary to steroids ,    2. New LV dysfunction, CM. Mild acute systolic CHF. EF 15-20% -cont diuresis per cardiology.  LHC today.increase coreg to 12.5mg  BID (help with PAT Hold Lasix /ACE inhibitor in anticipation of cardiac cath. Started on Mucinex as creatinine is slightly up, Received Pepcid, Benadryl, Solu-Medrol as a part of pre-cath orders suspected allergy to IV dye  3. Elevated trop on admission. ? NSTEMI with typical symptoms. Cont ASA, statins, started on low-dose beta blocker. Cardiac cath in the morning  4. PULMONARY EDEMA: Manage as above. Hold Lasix in anticipation of cath.   5. Polycythemia- chronic , on hydrourea ,check LDH, sees Dr Alen Blew, will set up follow-up with him White count elevated likely secondary to steroids vs  polycythemia   Code Status: full Family Communication: d/w patient, RN (indicate person spoken with, relationship, and if by phone, the number) Disposition Plan:cardiac cath today, transfer to telemetry after cardiac cath   Consultants:  Cardiology   Procedures:  echo  Antibiotics: Azithromycin/ceftriaxone 12/16>>>   (indicate start date, and stop date if known)  HPI/Subjective: Does not feel heart race. No CP, no SOB. Awaiting cath  Objective: Filed Vitals:   09/05/15 0752 09/05/15 1136  BP: 117/97 135/106  Pulse:    Temp: 97.4 F (36.3 C) 97.7 F (36.5 C)  Resp: 18 14    Intake/Output Summary (Last 24 hours) at 09/05/15 1243 Last data filed at  09/05/15 1200  Gross per 24 hour  Intake   1395 ml  Output   1445 ml  Net    -50 ml   Filed Weights   08/31/15 1854 09/01/15 0300 09/03/15 0900  Weight: 79.379 kg (175 lb) 83.9 kg (184 lb 15.5 oz) 83.9 kg (184 lb 15.5 oz)    Exam:   General:  No distress   Cardiovascular: s1,s2 rrr  Respiratory: few crackels LL  Abdomen: soft, nt,nd   Musculoskeletal: no led edema    Data Reviewed: Basic Metabolic Panel:  Recent Labs Lab 08/31/15 0025  09/01/15 0424 09/02/15 0203 09/03/15 0425 09/04/15 0238 09/05/15 0231  NA  --   < > 138 139 138 135 138  K  --   < > 4.3 4.3 3.6 4.6 4.8  CL  --   < > 103 101 99* 100* 103  CO2  --   < > 24 28 28 25 22   GLUCOSE  --   < > 186* 134* 107* 174* 166*  BUN  --   < > 15 26* 28* 36* 36*  CREATININE  --   < > 1.32* 1.39* 1.34* 1.44* 1.36*  CALCIUM  --   < > 9.0 8.9 8.6* 8.0* 8.4*  MG 2.0  --   --   --   --   --   --   PHOS 3.3  --   --   --   --   --   --   < > = values in this interval not displayed. Liver Function Tests:  Recent Labs Lab 09/01/15 0424 09/02/15 [id number]  09/03/15 0425 09/04/15 0238 09/05/15 0231  AST 33 21 26 40 36  ALT 36 35 39 39 51  ALKPHOS 70 64 74 68 71  BILITOT 1.8* 0.9 0.7 1.0 0.7  PROT 6.4* 6.2* 6.6 5.3* 5.6*  ALBUMIN 3.6 3.3* 3.3* 3.1* 3.1*   No results for input(s): LIPASE, AMYLASE in the last 168 hours. No results for input(s): AMMONIA in the last 168 hours. CBC:  Recent Labs Lab 09/01/15 0818 09/02/15 0203 09/03/15 0425 09/04/15 0238 09/05/15 0231  WBC 19.1* 26.4* 27.1* 27.5* 25.7*  NEUTROABS 18.2* 25.1* 25.9* 25.9* 24.0*  HGB 19.1* 17.6* 18.2* 18.8* 19.6*  HCT 59.5* 56.0* 56.8* 58.3* 59.7*  MCV 85.6 85.8 85.3 85.1 84.1  PLT 855* 824* 867* 912* 930*   Cardiac Enzymes:  Recent Labs Lab 09/01/15 0318 09/01/15 0818 09/01/15 1230 09/01/15 1853  TROPONINI 0.08* 0.12* 0.04* 0.03   BNP (last 3 results)  Recent Labs  08/31/15 1931  BNP 705.8*    ProBNP (last 3 results) No results  for input(s): PROBNP in the last 8760 hours.  CBG: No results for input(s): GLUCAP in the last 168 hours.  Recent Results (from the past 240 hour(s))  MRSA PCR Screening     Status: None   Collection Time: 09/01/15  2:09 AM  Result Value Ref Range Status   MRSA by PCR NEGATIVE NEGATIVE Final    Comment:        The GeneXpert MRSA Assay (FDA approved for NASAL specimens only), is one component of a comprehensive MRSA colonization surveillance program. It is not intended to diagnose MRSA infection nor to guide or monitor treatment for MRSA infections.   Culture, blood (routine x 2) Call MD if unable to obtain prior to antibiotics being given     Status: None (Preliminary result)   Collection Time: 09/01/15  4:24 AM  Result Value Ref Range Status   Specimen Description BLOOD RIGHT ARM  Final   Special Requests IN PEDIATRIC BOTTLE 5ML  Final   Culture NO GROWTH 3 DAYS  Final   Report Status PENDING  Incomplete  Culture, blood (routine x 2) Call MD if unable to obtain prior to antibiotics being given     Status: None (Preliminary result)   Collection Time: 09/01/15  4:31 AM  Result Value Ref Range Status   Specimen Description BLOOD RIGHT ANTECUBITAL  Final   Special Requests BOTTLES DRAWN AEROBIC AND ANAEROBIC 5ML  Final   Culture NO GROWTH 3 DAYS  Final   Report Status PENDING  Incomplete     Studies: No results found.  Scheduled Meds: . antiseptic oral rinse  7 mL Mouth Rinse BID  . aspirin  81 mg Oral Daily  . azithromycin  500 mg Intravenous Q24H  . carvedilol  12.5 mg Oral BID WC  . cefTRIAXone (ROCEPHIN)  IV  1 g Intravenous Q24H  . enoxaparin (LOVENOX) injection  40 mg Subcutaneous Q24H  . guaiFENesin  600 mg Oral BID  . hydroxyurea  500 mg Oral BID  . methylPREDNISolone (SOLU-MEDROL) injection  60 mg Intravenous BID  . pantoprazole  40 mg Oral Daily  . pravastatin  20 mg Oral q1800  . sodium chloride  3 mL Intravenous Q12H  . sodium chloride  3 mL Intravenous  Q12H  . tamsulosin  0.4 mg Oral Daily   Continuous Infusions: . sodium chloride 75 mL/hr at 09/05/15 0745    Principal Problem:   CAP (community acquired pneumonia) Active Problems:   Hyperlipidemia  Thrombocytosis (HCC)   Polycythemia   LVH (left ventricular hypertrophy)   Essential hypertension, benign   Sinus tachycardia (HCC)   Acute systolic HF (heart failure) ([place])    Time spent: >35 minutes     [place] [facility] Pager [phone]. If 7PM-7AM, please contact night-coverage at [email or web address], password [facility] 09/05/2015, 12:43 PM  LOS: 5 days

## 2015-09-05 NOTE — Progress Notes (Signed)
Transported to the cath lab. But sent back to his room for another urgent case coming. Explained to the pt.understood the situation.

## 2015-09-05 NOTE — Progress Notes (Signed)
Back from the cath lab with TR band to right wrist intact, elevated with pillow. Warm to touch, + capillary refill, pulses palpable.Instructed not to  Move right arm. Continue to monitor.

## 2015-09-05 NOTE — Progress Notes (Signed)
Pt has critical platelet count of 1060. Mid level informed.

## 2015-09-05 NOTE — Progress Notes (Signed)
Verified with MD the order for transfer to telemetry , gave an order to transfer after cardiac cath.

## 2015-09-05 NOTE — Progress Notes (Signed)
Dr.End, Cardiologist on call made aware of ongoing burst of SVT/Atrial tach with ECG;s done as well as last dose of PRN Lopressor given. Will continue to monitor patient, if patient stays in tachy rhythm for minutes then I'm to notify MD per MD. Patient asymptomatic, does not feel accelerated heart rate . Denies chest pain or discomfort.Marland Kitchen

## 2015-09-05 NOTE — Clinical Documentation Improvement (Signed)
  Hospitalist Please update your documentation within the medical record to reflect your response to this query.  Thank you  Can the noted abnormal lab values be further specified?   Acute Renal Failure/Acute Kidney Injury  Acute Tubular Necrosis  Acute Renal Cortical Necrosis  Acute Renal Medullary Necrosis  Acute on Chronic Renal Failure  Chronic Renal Failure  Other  Clinically Undetermined  Document any associated diagnoses/conditions.  Supporting Information: Results for MORTEZ, MEADOWCROFT (MRN [id number]) as of 09/05/2015 07:30  09/02/2015 02:03 09/03/2015 04:25 09/04/2015 02:38 09/05/2015 02:31  BUN 26 (H) 28 (H) 36 (H) 36 (H)  Creatinine 1.39 (H) 1.34 (H) 1.44 (H) 1.36 (H)  TX: Daily Bmet  Please exercise your independent, professional judgment when responding. A specific answer is not anticipated or expected.  Thank You,  Ermelinda Das, RN, BSN, [place] Certified Clinical Documentation Specialist [place]: Health Information Management [phone]

## 2015-09-05 NOTE — Interval H&P Note (Signed)
Cath Lab Visit (complete for each Cath Lab visit)  Clinical Evaluation Leading to the Procedure:   ACS: Yes.    Non-ACS:    Anginal Classification: CCS IV  Anti-ischemic medical therapy: Minimal Therapy (1 class of medications)  Non-Invasive Test Results: No non-invasive testing performed  Prior CABG: No previous CABG      History and Physical Interval Note:  09/05/2015 2:14 PM  Dillon [name]  has presented today for surgery, with the diagnosis of cp  The various methods of treatment have been discussed with the patient and family. After consideration of risks, benefits and other options for treatment, the patient has consented to  Procedure(s): Left Heart Cath and Coronary Angiography (N/A) as a surgical intervention .  The patient's history has been reviewed, patient examined, no change in status, stable for surgery.  I have reviewed the patient's chart and labs.  Questions were answered to the patient's satisfaction.     KELLY,THOMAS A

## 2015-09-05 NOTE — Progress Notes (Signed)
New: Dr. Claiborne Billings Subjective:  Dillon [name] is a 69 y.o. male with a history of hypertension, hyperlipidemia, polycythemia, thrombocytosis, GERD, former smoker (quit 1972) and LVH who came to [place] ED 08/31/15 for evaluation of shortness of breath and chest pain.  Does not feel heart race. No CP, no SOB. Awaiting cath  Objective:  Vital Signs in the last 24 hours: Temp:  [97.4 F (36.3 C)-98.3 F (36.8 C)] 97.4 F (36.3 C) (12/20 0752) Pulse Rate:  [86-137] 125 (12/19 1900) Resp:  [11-25] 18 (12/20 0752) BP: (117-138)/(81-103) 117/97 mmHg (12/20 0752) SpO2:  [96 %-98 %] 98 % (12/20 0752)  Intake/Output from previous day: 12/19 0701 - 12/20 0700 In: 1273 [P.O.:720; I.V.:3; IV Piggyback:550] Out: 1370 [Urine:1370]   Physical Exam: General: Well developed, well nourished, in no acute distress. Head:  Normocephalic and atraumatic. Lungs: Clear to auscultation and percussion. Heart: Normal S1 and S2.  No murmur, rubs or gallops. Ectopy noted.  Abdomen: soft, non-tender, positive bowel sounds. Extremities: No clubbing or cyanosis. No edema. Neurologic: Alert and oriented x 3.    Lab Results:  Recent Labs  09/04/15 0238 09/05/15 0231  WBC 27.5* 25.7*  HGB 18.8* 19.6*  PLT 912* 930*    Recent Labs  09/04/15 0238 09/05/15 0231  NA 135 138  K 4.6 4.8  CL 100* 103  CO2 25 22  GLUCOSE 174* 166*  BUN 36* 36*  CREATININE 1.44* 1.36*   No results for input(s): TROPONINI in the last 72 hours.  Invalid input(s): CK, MB Hepatic Function Panel  Recent Labs  09/05/15 0231  PROT 5.6*  ALBUMIN 3.1*  AST 36  ALT 51  ALKPHOS 71  BILITOT 0.7    Telemetry: PAT. NSR Personally viewed.   EKG:  LVH, NSSTW changes, repol abn.  Personally viewed.  Cardiac Studies:  EF 15-20%  Meds: Scheduled Meds: . antiseptic oral rinse  7 mL Mouth Rinse BID  . aspirin  81 mg Oral Daily  . azithromycin  500 mg Intravenous Q24H  . carvedilol  12.5 mg Oral BID  WC  . cefTRIAXone (ROCEPHIN)  IV  1 g Intravenous Q24H  . enoxaparin (LOVENOX) injection  40 mg Subcutaneous Q24H  . guaiFENesin  600 mg Oral BID  . hydroxyurea  500 mg Oral BID  . methylPREDNISolone (SOLU-MEDROL) injection  60 mg Intravenous BID  . pantoprazole  40 mg Oral Daily  . pravastatin  20 mg Oral q1800  . sodium chloride  3 mL Intravenous Q12H  . sodium chloride  3 mL Intravenous Q12H  . tamsulosin  0.4 mg Oral Daily   Continuous Infusions: . sodium chloride     PRN Meds:.sodium chloride, sodium chloride, levalbuterol, ondansetron **OR** ondansetron (ZOFRAN) IV, sodium chloride, sodium chloride  Assessment/Plan:  Principal Problem:   CAP (community acquired pneumonia) Active Problems:   Hyperlipidemia   Thrombocytosis (HCC)   Polycythemia   LVH (left ventricular hypertrophy)   Essential hypertension, benign   Sinus tachycardia (HCC)   Acute systolic HF (heart failure) (HCC)  Acute systolic heart failure  - cath to define anatomy  - increase coreg to 12.5mg  BID (help with PAT)   - ? Prior IV dye allergy - Pepcid, Ben. Per Dr. Percival Spanish history  - Holding lasix  - Hopeful ACE prior to DC. Holding cath  SVT  - reviewed Dr. Saunders Revel note. SVT/Atach. PRN lopressor. Hopefully increased coreg will help.  Demand ischemia  - low level troponin likely secondary to CHF  -  cath pending (postoned yesterday)  Polycythemia  - chronic   - meds noted  - WBC from 19 to 27  - PLT from 855 to 912  - per primary team.   Hyperlipidemia  - Pravastatin  CAP  - abx noted per primary team.   Marlou Porch, [place] 09/05/2015, 9:36 AM

## 2015-09-05 NOTE — Progress Notes (Signed)
Cardiologist agrees that pt can stay on unit until tomorrow along as ICU step down bed is not needed.

## 2015-09-05 NOTE — H&P (View-Only) (Signed)
New: Dr. Claiborne Billings Subjective:  Dillon [name] is a 69 y.o. male with a history of hypertension, hyperlipidemia, polycythemia, thrombocytosis, GERD, former smoker (quit 1972) and LVH who came to [facility] ED 08/31/15 for evaluation of shortness of breath and chest pain.  Does not feel heart race. No CP, no SOB. Awaiting cath  Objective:  Vital Signs in the last 24 hours: Temp:  [97.4 F (36.3 C)-98.3 F (36.8 C)] 97.4 F (36.3 C) (12/20 0752) Pulse Rate:  [86-137] 125 (12/19 1900) Resp:  [11-25] 18 (12/20 0752) BP: (117-138)/(81-103) 117/97 mmHg (12/20 0752) SpO2:  [96 %-98 %] 98 % (12/20 0752)  Intake/Output from previous day: 12/19 0701 - 12/20 0700 In: 1273 [P.O.:720; I.V.:3; IV Piggyback:550] Out: 1370 [Urine:1370]   Physical Exam: General: Well developed, well nourished, in no acute distress. Head:  Normocephalic and atraumatic. Lungs: Clear to auscultation and percussion. Heart: Normal S1 and S2.  No murmur, rubs or gallops. Ectopy noted.  Abdomen: soft, non-tender, positive bowel sounds. Extremities: No clubbing or cyanosis. No edema. Neurologic: Alert and oriented x 3.    Lab Results:  Recent Labs  09/04/15 0238 09/05/15 0231  WBC 27.5* 25.7*  HGB 18.8* 19.6*  PLT 912* 930*    Recent Labs  09/04/15 0238 09/05/15 0231  NA 135 138  K 4.6 4.8  CL 100* 103  CO2 25 22  GLUCOSE 174* 166*  BUN 36* 36*  CREATININE 1.44* 1.36*   No results for input(s): TROPONINI in the last 72 hours.  Invalid input(s): CK, MB Hepatic Function Panel  Recent Labs  09/05/15 0231  PROT 5.6*  ALBUMIN 3.1*  AST 36  ALT 51  ALKPHOS 71  BILITOT 0.7    Telemetry: PAT. NSR Personally viewed.   EKG:  LVH, NSSTW changes, repol abn.  Personally viewed.  Cardiac Studies:  EF 15-20%  Meds: Scheduled Meds: . antiseptic oral rinse  7 mL Mouth Rinse BID  . aspirin  81 mg Oral Daily  . azithromycin  500 mg Intravenous Q24H  . carvedilol  12.5 mg Oral BID  WC  . cefTRIAXone (ROCEPHIN)  IV  1 g Intravenous Q24H  . enoxaparin (LOVENOX) injection  40 mg Subcutaneous Q24H  . guaiFENesin  600 mg Oral BID  . hydroxyurea  500 mg Oral BID  . methylPREDNISolone (SOLU-MEDROL) injection  60 mg Intravenous BID  . pantoprazole  40 mg Oral Daily  . pravastatin  20 mg Oral q1800  . sodium chloride  3 mL Intravenous Q12H  . sodium chloride  3 mL Intravenous Q12H  . tamsulosin  0.4 mg Oral Daily   Continuous Infusions: . sodium chloride     PRN Meds:.sodium chloride, sodium chloride, levalbuterol, ondansetron **OR** ondansetron (ZOFRAN) IV, sodium chloride, sodium chloride  Assessment/Plan:  Principal Problem:   CAP (community acquired pneumonia) Active Problems:   Hyperlipidemia   Thrombocytosis (HCC)   Polycythemia   LVH (left ventricular hypertrophy)   Essential hypertension, benign   Sinus tachycardia (HCC)   Acute systolic HF (heart failure) (HCC)  Acute systolic heart failure  - cath to define anatomy  - increase coreg to 12.5mg  BID (help with PAT)   - ? Prior IV dye allergy - Pepcid, Ben. Per Dr. Percival Spanish history  - Holding lasix  - Hopeful ACE prior to DC. Holding cath  SVT  - reviewed Dr. Saunders Revel note. SVT/Atach. PRN lopressor. Hopefully increased coreg will help.  Demand ischemia  - low level troponin likely secondary to CHF  -  cath pending (postoned yesterday)  Polycythemia  - chronic   - meds noted  - WBC from 19 to 27  - PLT from 855 to 912  - per primary team.   Hyperlipidemia  - Pravastatin  CAP  - abx noted per primary team.   Marlou Porch, [place] 09/05/2015, 9:36 AM

## 2015-09-06 ENCOUNTER — Encounter (HOSPITAL_COMMUNITY): Payer: Self-pay | Admitting: Cardiovascular Disease

## 2015-09-06 LAB — CULTURE, BLOOD (ROUTINE X 2)
CULTURE: NO GROWTH
Culture: NO GROWTH

## 2015-09-06 LAB — COMPREHENSIVE METABOLIC PANEL
ALBUMIN: 2.8 g/dL — AB (ref 3.5–5.0)
ALK PHOS: 63 U/L (ref 38–126)
ALT: 61 U/L (ref 17–63)
ANION GAP: 12 (ref 5–15)
AST: 27 U/L (ref 15–41)
BILIRUBIN TOTAL: 0.8 mg/dL (ref 0.3–1.2)
BUN: 31 mg/dL — ABNORMAL HIGH (ref 6–20)
CALCIUM: 8.4 mg/dL — AB (ref 8.9–10.3)
CO2: 22 mmol/L (ref 22–32)
Chloride: 101 mmol/L (ref 101–111)
Creatinine, Ser: 1.37 mg/dL — ABNORMAL HIGH (ref 0.61–1.24)
GFR calc non Af Amer: 50 mL/min — ABNORMAL LOW (ref >60)
GFR, EST AFRICAN AMERICAN: 58 mL/min — AB (ref >60)
GLUCOSE: 194 mg/dL — AB (ref 65–99)
POTASSIUM: 4.4 mmol/L (ref 3.5–5.1)
Sodium: 135 mmol/L (ref 135–145)
TOTAL PROTEIN: 5.1 g/dL — AB (ref 6.5–8.1)

## 2015-09-06 LAB — CBC WITH DIFFERENTIAL/PLATELET
BASOS ABS: 0 10*3/uL (ref 0.0–0.1)
BASOS PCT: 0 %
EOS ABS: 0 10*3/uL (ref 0.0–0.7)
Eosinophils Relative: 0 %
HEMATOCRIT: 58.4 % — AB (ref 39.0–52.0)
HEMOGLOBIN: 18.6 g/dL — AB (ref 13.0–17.0)
LYMPHS ABS: 0.8 10*3/uL (ref 0.7–4.0)
Lymphocytes Relative: 3 %
MCH: 27.6 pg (ref 26.0–34.0)
MCHC: 31.8 g/dL (ref 30.0–36.0)
MCV: 86.6 fL (ref 78.0–100.0)
Monocytes Absolute: 0.5 10*3/uL (ref 0.1–1.0)
Monocytes Relative: 2 %
NEUTROS ABS: 24.1 10*3/uL — AB (ref 1.7–7.7)
Neutrophils Relative %: 95 %
Platelets: 951 10*3/uL (ref 150–400)
RBC: 6.74 MIL/uL — ABNORMAL HIGH (ref 4.22–5.81)
RDW: 18.2 % — AB (ref 11.5–15.5)
WBC: 25.4 10*3/uL — ABNORMAL HIGH (ref 4.0–10.5)

## 2015-09-06 MED ORDER — AZITHROMYCIN 500 MG PO TABS
500.0000 mg | ORAL_TABLET | Freq: Every day | ORAL | Status: DC
Start: 2015-09-06 — End: 2015-09-12

## 2015-09-06 MED ORDER — GUAIFENESIN ER 600 MG PO TB12: 600.0000 mg | ORAL_TABLET | Freq: Two times a day (BID) | ORAL | Status: DC

## 2015-09-06 MED ORDER — PANTOPRAZOLE SODIUM 40 MG PO TBEC: 40.0000 mg | DELAYED_RELEASE_TABLET | Freq: Every day | ORAL | Status: DC

## 2015-09-06 MED ORDER — FUROSEMIDE 20 MG PO TABS
20.0000 mg | ORAL_TABLET | Freq: Every day | ORAL | Status: DC
  Administered 2015-09-06: 20 mg via ORAL
  Filled 2015-09-06: qty 1

## 2015-09-06 MED ORDER — LISINOPRIL 5 MG PO TABS: 5.0000 mg | ORAL_TABLET | Freq: Every day | ORAL | Status: DC

## 2015-09-06 MED ORDER — AZITHROMYCIN 500 MG PO TABS
500.0000 mg | ORAL_TABLET | Freq: Every day | ORAL | Status: DC
  Filled 2015-09-06: qty 1

## 2015-09-06 MED ORDER — FUROSEMIDE 20 MG PO TABS: 20.0000 mg | ORAL_TABLET | Freq: Every day | ORAL | Status: DC

## 2015-09-06 MED ORDER — LISINOPRIL 5 MG PO TABS
5.0000 mg | ORAL_TABLET | Freq: Every day | ORAL | Status: DC
  Administered 2015-09-06: 5 mg via ORAL
  Filled 2015-09-06: qty 1

## 2015-09-06 MED ORDER — CARVEDILOL 12.5 MG PO TABS: 12.5000 mg | ORAL_TABLET | Freq: Two times a day (BID) | ORAL | Status: DC

## 2015-09-06 NOTE — Progress Notes (Signed)
Discharged home by wheelchair accompanied by friend, stable, discharged instructions given to pt.  Belongings taken home.

## 2015-09-06 NOTE — Care Management Note (Signed)
Case Management Note  Patient Details  Name: Dillon [name] MRN: [id number] Date of Birth: Jul 02, 1946  Subjective/Objective:                    Action/Plan: Consult received to make a follow-up appointment for patient with Dr Alen Blew at the [facility].  Voicemail was left for Dr Hazeline Junker scheduler, awaiting return call.  Patient needs to be seen 1-2 weeks after discharge. This information, along with Dr Hazeline Junker office contact information was added to the AVS in the event that patient discharges before the appointment can be made.  CM spoke with bedside RN to inform that the appointment has not yet been scheduled. CM will continue to follow.  Expected Discharge Date:                  Expected Discharge Plan:  Home/Self Care  In-House Referral:     Discharge planning Services  CM Consult  Post Acute Care Choice:    Choice offered to:     DME Arranged:    DME Agency:     HH Arranged:    HH Agency:     Status of Service:  In process, will continue to follow  Medicare Important Message Given:  Yes Date Medicare IM Given:    Medicare IM give by:    Date Additional Medicare IM Given:    Additional Medicare Important Message give by:     If discussed at [place] of Stay Meetings, dates discussed:    Additional CommentsRolm Baptise, RN 09/06/2015, 3:24 PM [phone]

## 2015-09-06 NOTE — Progress Notes (Signed)
Pharmacy Protocol Note   This patient is receiving the antibiotic(s) azithromycin by the  intravenous route. Based on criteria approved by the Pharmacy and Therapeutics Committee, and the Infectious Disease Division, the antibiotic is being converted to equivalent oral dose form(s). These criteria include:  . Patient being treated for a respiratory tract infection, urinary tract infection, cellulitis, or Clostridium Difficile Associated Diarrhea . The patient is not neutropenic and does not exhibit a GI malabsorption state . The patient is eating (either orally or per tube) and/or has been taking other orally administered medications for at least 24 hours. . The patient is improving clinically (physician assessment and a 24-hour Tmax of ? 100.5? F).  If you have questions about this conversion, please contact the pharmacy department.  Thank you.   Governor Specking, PharmD Clinical Pharmacy Resident 09/06/2015, [phone]

## 2015-09-06 NOTE — Progress Notes (Signed)
New: Dr. Claiborne Billings  Subjective:  Dillon [name] is a 69 y.o. male with a history of hypertension, hyperlipidemia, polycythemia, thrombocytosis, GERD, former smoker (quit 1972) and LVH who came to [facility] ED 08/31/15 for evaluation of shortness of breath and chest pain.  Does not feel heart race. No CP, no SOB. Cath showed No CAD  Objective:  Vital Signs in the last 24 hours: Temp:  [97.4 F (36.3 C)-98.3 F (36.8 C)] 98.3 F (36.8 C) (12/21 0753) Pulse Rate:  [0-163] 68 (12/21 0900) Resp:  [0-26] 14 (12/21 0900) BP: (108-145)/(77-111) 126/86 mmHg (12/21 0900) SpO2:  [0 %-100 %] 98 % (12/21 0900)  Intake/Output from previous day: 12/20 0701 - 12/21 0700 In: [id number] [P.O.:500; I.V.:575; IV Piggyback:300] Out: 1100 [Urine:1100]   Physical Exam: General: Well developed, well nourished, in no acute distress. Head:  Normocephalic and atraumatic. Lungs: Clear to auscultation and percussion. Heart: Normal S1 and S2.  No murmur, rubs or gallops. Ectopy noted.  Abdomen: soft, non-tender, positive bowel sounds. Extremities: No clubbing or cyanosis. No edema. Neurologic: Alert and oriented x 3.    Lab Results:  Recent Labs  09/05/15 1850 09/06/15 0352  WBC 26.1* 25.4*  HGB 19.9* 18.6*  PLT 1060* 951*    Recent Labs  09/05/15 0231 09/05/15 1850 09/06/15 0352  NA 138  --  135  K 4.8  --  4.4  CL 103  --  101  CO2 22  --  22  GLUCOSE 166*  --  194*  BUN 36*  --  31*  CREATININE 1.36* 1.29* 1.37*    Hepatic Function Panel  Recent Labs  09/06/15 0352  PROT 5.1*  ALBUMIN 2.8*  AST 27  ALT 61  ALKPHOS 63  BILITOT 0.8    Telemetry: PAT. NSR Personally viewed.   EKG:  LVH, NSSTW changes, repol abn.  Personally viewed.  Cardiac Studies:  EF 15-20%  Meds: Scheduled Meds: . antiseptic oral rinse  7 mL Mouth Rinse BID  . aspirin EC  81 mg Oral Daily  . azithromycin  500 mg Intravenous Q24H  . carvedilol  12.5 mg Oral BID WC  . cefTRIAXone  (ROCEPHIN)  IV  1 g Intravenous Q24H  . enoxaparin (LOVENOX) injection  40 mg Subcutaneous Q24H  . guaiFENesin  600 mg Oral BID  . hydroxyurea  500 mg Oral BID  . methylPREDNISolone (SOLU-MEDROL) injection  60 mg Intravenous BID  . pantoprazole  40 mg Oral Daily  . pravastatin  20 mg Oral q1800  . sodium chloride  3 mL Intravenous Q12H  . sodium chloride  3 mL Intravenous Q12H  . tamsulosin  0.4 mg Oral Daily   Continuous Infusions:   PRN Meds:.sodium chloride, sodium chloride, acetaminophen, levalbuterol, ondansetron (ZOFRAN) IV, sodium chloride, sodium chloride  Assessment/Plan:  Principal Problem:   CAP (community acquired pneumonia) Active Problems:   Hyperlipidemia   Thrombocytosis (HCC)   Polycythemia   LVH (left ventricular hypertrophy)   Essential hypertension, benign   Sinus tachycardia (HCC)   Acute systolic HF (heart failure) (HCC)   NICM (nonischemic cardiomyopathy) (HCC)  Acute systolic heart failure  - cath shows no CAD, non ischemic cardiomyopathy  - increased coreg to 12.5mg  BID (help with PAT)   - resume lasix 20mg  QD  - add lisinopril 5mg  PO QD  - appears compensated     SVT  - reviewed Dr. Saunders Revel note. SVT/Atach. PRN lopressor. Hopefully increased coreg will help.  Demand ischemia  - low  level troponin likely secondary to CHF   Polycythemia  - chronic   - meds noted  - WBC from 19 to 27  - PLT from 855 to 1000  - per primary team.   Hyperlipidemia  - Pravastatin  CAP  - abx noted per primary team.   From cardiology perspective, OK for DC Follow up with Dr. Claiborne Billings or APP will be arranged.   Chondra Boyde, [place] 09/06/2015, 9:27 AM

## 2015-09-06 NOTE — Discharge Summary (Signed)
Physician Discharge Summary  Dillon [name] MRN: [id number] DOB/AGE: [birth year] 71 y.o.  PCP: Cathlean Cower, MD   Admit date: 08/31/2015 Discharge date: 09/06/2015  Discharge Diagnoses:     Principal Problem:   CAP (community acquired pneumonia) Active Problems:   Hyperlipidemia   Thrombocytosis ([place])   Polycythemia   LVH (left ventricular hypertrophy)   Essential hypertension, benign   Sinus tachycardia (HCC)   Acute systolic HF (heart failure) (HCC)   NICM (nonischemic cardiomyopathy) ([place])    Follow-up recommendations Follow-up with PCP in 3-5 days , including all  additional recommended appointments as below Follow-up CBC, CMP in 3-5 days Patient to follow-up with Dr. Claiborne Billings, cardiology will arrange for follow-up Patient to follow-up with Dr Alen Blew for polycythemia     Medication List    STOP taking these medications        amLODipine 5 MG tablet  Commonly known as:  NORVASC     naproxen sodium 220 MG tablet  Commonly known as:  ANAPROX     tadalafil 20 MG tablet  Commonly known as:  CIALIS      TAKE these medications        aspirin 81 MG tablet  Take 81 mg by mouth daily.     azithromycin 500 MG tablet  Commonly known as:  ZITHROMAX  Take 1 tablet (500 mg total) by mouth daily.     carvedilol 12.5 MG tablet  Commonly known as:  COREG  Take 1 tablet (12.5 mg total) by mouth 2 (two) times daily with a meal.     furosemide 20 MG tablet  Commonly known as:  LASIX  Take 1 tablet (20 mg total) by mouth daily.     guaiFENesin 600 MG 12 hr tablet  Commonly known as:  MUCINEX  Take 1 tablet (600 mg total) by mouth 2 (two) times daily.     hydroxyurea 500 MG capsule  Commonly known as:  HYDREA  Take 1 capsule (500 mg total) by mouth 2 (two) times daily. May take with food to minimize GI side effects.     lisinopril 5 MG tablet  Commonly known as:  PRINIVIL,ZESTRIL  Take 1 tablet (5 mg total) by mouth daily.     lovastatin 40 MG tablet  Commonly  known as:  MEVACOR  TAKE 1 TABLET (40 MG TOTAL) BY MOUTH DAILY.     pantoprazole 40 MG tablet  Commonly known as:  PROTONIX  Take 1 tablet (40 mg total) by mouth daily.     tamsulosin 0.4 MG Caps capsule  Commonly known as:  FLOMAX  TAKE 1 CAPSULE (0.4 MG TOTAL) BY MOUTH DAILY.         Discharge Condition: Stable Discharge Instructions       Discharge Instructions    Diet - low sodium heart healthy    Complete by:  As directed      Increase activity slowly    Complete by:  As directed            No Known Allergies    Disposition: 02-Short Term Hospital   Consults: * Cardiology   Significant Diagnostic Studies:  Dg Chest 2 View  08/31/2015  CLINICAL DATA:  SOB. Pt c/o SOB with exertion x 1 day. No other chest complaints. Pt currently on 2 liters of oxygen that he states helps. Hx ex-smoker as of 45 years ago and HTN controlled with medication. EXAM: CHEST  2 VIEW COMPARISON:  None. FINDINGS: Midline trachea. Mild cardiomegaly. Mediastinal  contours otherwise within normal limits. No pleural effusion or pneumothorax. Thickening of the fissures. Pulmonary interstitial prominence and indistinctness. Retrocardiac left lower lobe patchy airspace disease. IMPRESSION: Mild cardiomegaly with interstitial edema. Patchy left base airspace disease is suspicious for concurrent infection. Followup PA and lateral chest X-ray is recommended in 3-4 weeks following trial of antibiotic therapy to ensure resolution and exclude underlying malignancy. Electronically Signed   By: Abigail Miyamoto M.D.   On: 08/31/2015 19:56   Ct Angio Chest Pe W/cm &/or Wo Cm  08/31/2015  CLINICAL DATA:  Shortness of breath when lying flat. Elevated D-dimer. EXAM: CT ANGIOGRAPHY CHEST WITH CONTRAST TECHNIQUE: Multidetector CT imaging of the chest was performed using the standard protocol during bolus administration of intravenous contrast. Multiplanar CT image reconstructions and MIPs were obtained to evaluate  the vascular anatomy. CONTRAST:  76m OMNIPAQUE IOHEXOL 350 MG/ML SOLN COMPARISON:  Chest radiograph 08/31/2015 FINDINGS: Technically adequate study with good opacification of the central and segmental pulmonary arteries. No focal filling defects demonstrated. No evidence of significant pulmonary embolus. Diffuse cardiac enlargement. Normal caliber thoracic aorta. Lymph nodes demonstrated throughout the mediastinum and hilar regions are not pathologically enlarged and are likely reactive. Esophagus is decompressed. Evaluation of lungs is limited due to respiratory motion artifact. There are small bilateral pleural effusions. There is diffuse interstitial infiltration with airspace infiltration predominantly in the lung bases. This is likely due to edema although superimposed pneumonia is not excluded. Airways thickening suggesting bronchitis. No pneumothorax. Included portions of the upper abdominal organs are grossly unremarkable. Incidental note of a lipoma in the right anterior chest wall. Degenerative changes in the spine. Review of the MIP images confirms the above findings. IMPRESSION: No evidence of significant pulmonary embolus. Cardiac enlargement with vascular congestion and diffuse interstitial edema. Airspace edema versus pneumonia in the lung bases. Bilateral pleural effusions. Peribronchial thickening suggesting bronchitis. Electronically Signed   By: WLucienne CapersM.D.   On: 08/31/2015 22:19        Filed Weights   08/31/15 1854 09/01/15 0300 09/03/15 0900  Weight: 79.379 kg (175 lb) 83.9 kg (184 lb 15.5 oz) 83.9 kg (184 lb 15.5 oz)     Microbiology: Recent Results (from the past 240 hour(s))  MRSA PCR Screening     Status: None   Collection Time: 09/01/15  2:09 AM  Result Value Ref Range Status   MRSA by PCR NEGATIVE NEGATIVE Final    Comment:        The GeneXpert MRSA Assay (FDA approved for NASAL specimens only), is one component of a comprehensive MRSA  colonization surveillance program. It is not intended to diagnose MRSA infection nor to guide or monitor treatment for MRSA infections.   Culture, blood (routine x 2) Call MD if unable to obtain prior to antibiotics being given     Status: None   Collection Time: 09/01/15  4:24 AM  Result Value Ref Range Status   Specimen Description BLOOD RIGHT ARM  Final   Special Requests IN PEDIATRIC BOTTLE 5ML  Final   Culture NO GROWTH 5 DAYS  Final   Report Status 09/06/2015 FINAL  Final  Culture, blood (routine x 2) Call MD if unable to obtain prior to antibiotics being given     Status: None   Collection Time: 09/01/15  4:31 AM  Result Value Ref Range Status   Specimen Description BLOOD RIGHT ANTECUBITAL  Final   Special Requests BOTTLES DRAWN AEROBIC AND ANAEROBIC 5ML  Final   Culture NO GROWTH 5  DAYS  Final   Report Status 09/06/2015 FINAL  Final       Blood Culture    Component Value Date/Time   SDES URINE, RANDOM 09/01/2015 0739   SPECREQUEST NONE 09/01/2015 0739   CULT NO GROWTH 5 DAYS 09/01/2015 0431   REPTSTATUS 09/04/2015 FINAL 09/01/2015 0739      Labs: Results for orders placed or performed during the hospital encounter of 08/31/15 (from the past 48 hour(s))  CBC WITH DIFFERENTIAL     Status: Abnormal   Collection Time: 09/05/15  2:31 AM  Result Value Ref Range   WBC 25.7 (H) 4.0 - 10.5 K/uL   RBC 7.10 (H) 4.22 - 5.81 MIL/uL   Hemoglobin 19.6 (H) 13.0 - 17.0 g/dL   HCT 59.7 (H) 39.0 - 52.0 %   MCV 84.1 78.0 - 100.0 fL   MCH 27.6 26.0 - 34.0 pg   MCHC 32.8 30.0 - 36.0 g/dL   RDW 17.4 (H) 11.5 - 15.5 %   Platelets 930 (HH) 150 - 400 K/uL    Comment: REPEATED TO VERIFY PLATELET COUNT CONFIRMED BY SMEAR CRITICAL VALUE NOTED.  VALUE IS CONSISTENT WITH PREVIOUSLY REPORTED AND CALLED VALUE.    Neutrophils Relative % 93 %   Neutro Abs 24.0 (H) 1.7 - 7.7 K/uL   Lymphocytes Relative 2 %   Lymphs Abs 0.6 (L) 0.7 - 4.0 K/uL   Monocytes Relative 5 %   Monocytes  Absolute 1.2 (H) 0.1 - 1.0 K/uL   Eosinophils Relative 0 %   Eosinophils Absolute 0.0 0.0 - 0.7 K/uL   Basophils Relative 0 %   Basophils Absolute 0.0 0.0 - 0.1 K/uL   WBC Morphology VACUOLATED NEUTROPHILS     Comment: TOXIC GRANULATION  Comprehensive metabolic panel     Status: Abnormal   Collection Time: 09/05/15  2:31 AM  Result Value Ref Range   Sodium 138 135 - 145 mmol/L   Potassium 4.8 3.5 - 5.1 mmol/L   Chloride 103 101 - 111 mmol/L   CO2 22 22 - 32 mmol/L   Glucose, Bld 166 (H) 65 - 99 mg/dL   BUN 36 (H) 6 - 20 mg/dL   Creatinine, Ser 1.36 (H) 0.61 - 1.24 mg/dL   Calcium 8.4 (L) 8.9 - 10.3 mg/dL   Total Protein 5.6 (L) 6.5 - 8.1 g/dL   Albumin 3.1 (L) 3.5 - 5.0 g/dL   AST 36 15 - 41 U/L   ALT 51 17 - 63 U/L   Alkaline Phosphatase 71 38 - 126 U/L   Total Bilirubin 0.7 0.3 - 1.2 mg/dL   GFR calc non Af Amer 51 (L) >60 mL/min   GFR calc Af Amer 59 (L) >60 mL/min    Comment: (NOTE) The eGFR has been calculated using the CKD EPI equation. This calculation has not been validated in all clinical situations. eGFR's persistently <60 mL/min signify possible Chronic Kidney Disease.    Anion gap 13 5 - 15  Pathologist smear review     Status: None   Collection Time: 09/05/15  2:31 AM  Result Value Ref Range   Path Review THROMBOCYTOSIS AND NEUTROPHILIA.     Comment: REACTIVE APPEARING LYMPHOCYTES. Reviewed by Lennox Solders Lyndon Code, M.D. 09/05/15.   Lactate dehydrogenase     Status: Abnormal   Collection Time: 09/05/15  6:50 PM  Result Value Ref Range   LDH 372 (H) 98 - 192 U/L  CBC     Status: Abnormal   Collection Time: 09/05/15  6:50 PM  Result Value Ref Range   WBC 26.1 (H) 4.0 - 10.5 K/uL   RBC 7.18 (H) 4.22 - 5.81 MIL/uL   Hemoglobin 19.9 (H) 13.0 - 17.0 g/dL   HCT 61.6 (H) 39.0 - 52.0 %   MCV 85.8 78.0 - 100.0 fL   MCH 27.7 26.0 - 34.0 pg   MCHC 32.3 30.0 - 36.0 g/dL   RDW 18.3 (H) 11.5 - 15.5 %   Platelets 1060 (HH) 150 - 400 K/uL    Comment: REPEATED TO  VERIFY CRITICAL RESULT CALLED TO, READ BACK BY AND VERIFIED WITH: Jerral Bonito 1932 09/05/2015 WBOND   Creatinine, serum     Status: Abnormal   Collection Time: 09/05/15  6:50 PM  Result Value Ref Range   Creatinine, Ser 1.29 (H) 0.61 - 1.24 mg/dL   GFR calc non Af Amer 54 (L) >60 mL/min   GFR calc Af Amer >60 >60 mL/min    Comment: (NOTE) The eGFR has been calculated using the CKD EPI equation. This calculation has not been validated in all clinical situations. eGFR's persistently <60 mL/min signify possible Chronic Kidney Disease.   CBC WITH DIFFERENTIAL     Status: Abnormal   Collection Time: 09/06/15  3:52 AM  Result Value Ref Range   WBC 25.4 (H) 4.0 - 10.5 K/uL   RBC 6.74 (H) 4.22 - 5.81 MIL/uL   Hemoglobin 18.6 (H) 13.0 - 17.0 g/dL   HCT 58.4 (H) 39.0 - 52.0 %   MCV 86.6 78.0 - 100.0 fL   MCH 27.6 26.0 - 34.0 pg   MCHC 31.8 30.0 - 36.0 g/dL   RDW 18.2 (H) 11.5 - 15.5 %   Platelets 951 (HH) 150 - 400 K/uL    Comment: REPEATED TO VERIFY CRITICAL VALUE NOTED.  VALUE IS CONSISTENT WITH PREVIOUSLY REPORTED AND CALLED VALUE.    Neutrophils Relative % 95 %   Lymphocytes Relative 3 %   Monocytes Relative 2 %   Eosinophils Relative 0 %   Basophils Relative 0 %   Neutro Abs 24.1 (H) 1.7 - 7.7 K/uL   Lymphs Abs 0.8 0.7 - 4.0 K/uL   Monocytes Absolute 0.5 0.1 - 1.0 K/uL   Eosinophils Absolute 0.0 0.0 - 0.7 K/uL   Basophils Absolute 0.0 0.0 - 0.1 K/uL   Smear Review MORPHOLOGY UNREMARKABLE   Comprehensive metabolic panel     Status: Abnormal   Collection Time: 09/06/15  3:52 AM  Result Value Ref Range   Sodium 135 135 - 145 mmol/L   Potassium 4.4 3.5 - 5.1 mmol/L   Chloride 101 101 - 111 mmol/L   CO2 22 22 - 32 mmol/L   Glucose, Bld 194 (H) 65 - 99 mg/dL   BUN 31 (H) 6 - 20 mg/dL   Creatinine, Ser 1.37 (H) 0.61 - 1.24 mg/dL   Calcium 8.4 (L) 8.9 - 10.3 mg/dL   Total Protein 5.1 (L) 6.5 - 8.1 g/dL   Albumin 2.8 (L) 3.5 - 5.0 g/dL   AST 27 15 - 41 U/L   ALT 61 17 - 63  U/L   Alkaline Phosphatase 63 38 - 126 U/L   Total Bilirubin 0.8 0.3 - 1.2 mg/dL   GFR calc non Af Amer 50 (L) >60 mL/min   GFR calc Af Amer 58 (L) >60 mL/min    Comment: (NOTE) The eGFR has been calculated using the CKD EPI equation. This calculation has not been validated in all clinical situations. eGFR's persistently <60 mL/min signify possible Chronic Kidney  Disease.    Anion gap 12 5 - 15     Lipid Panel     Component Value Date/Time   CHOL 125 11/11/2014 1715   TRIG 132.0 11/11/2014 1715   HDL 55.60 11/11/2014 1715   CHOLHDL 2 11/11/2014 1715   VLDL 26.4 11/11/2014 1715   LDLCALC 43 11/11/2014 1715   LDLDIRECT 101.0 10/29/2013 1713     Lab Results  Component Value Date   HGBA1C 5.6 04/28/2013   HGBA1C 5.6 04/27/2012   HGBA1C 5.6 04/18/2011     Lab Results  Component Value Date   LDLCALC 43 11/11/2014   CREATININE 1.37* 09/06/2015     HPI :Dillon [name] is a 69 y.o. male with a past medical history of hyperlipidemia, GERD, essential hypertension, LVH, osteoarthritis, polycythemia, thrombocytosis who comes to the emergency department with complaints of shortness of breath and chest tightness for several days, but particularly worse in the last 2 days. The pain is substernal, pressure-like, nonradiating, pleuritic, associated with palpitations. He denies nausea, edema, dizziness or diaphoresis. Patient has been having low-grade temperatures at home and cough mostly nonproductive. He denies travel history or sick contacts. He received his pneumonia vaccine and flu vaccine this year. He has a remote history of smoking, but stopped in the 1970s. The patient smoked half a pack of cigarettes for several years. He does not use inhalers or oxygen at home.   When the patient was in the emergency department, he had several brief episodes of sinus tachycardia up in the 150s without worsening of his chest pain. The patient had already received multiple nebulizer treatments  at that time. Tachycardia subsided with metoprolol 5 mg IV and magnesium sulfate 2 g IV   HOSPITAL COURSE:   1. Probable CAP. CT no PE. Patient is afebrile. Patient received IV antibiotics namely Rocephin and azithromycin, switched to azithromycin 5 more days, blood culture no growth so far -? Underlying COPD. Marland Kitchen Continue bronchodilators prn, steroids started on admission. Increasing white count likely secondary to steroids , , steroids discontinued at the time of discharge   2. New LV dysfunction, CM. Mild acute systolic CHF. EF 15-20% -cont diuresis per cardiology. Lt heart cath shows no CAD, non ischemic cardiomyopathy - increased coreg to 12.7m BID (help with PAT)  - resume lasix 241mQD - add lisinopril 56m54mO QD - appears compensated Received Pepcid, Benadryl, Solu-Medrol as a part of pre-cath orders suspected allergy to IV dye  3. Elevated trop on admission. ? NSTEMI with typical symptoms. Cont ASA, statins, started on low-dose beta blocker. Cardiac cath negative for coronary artery disease  4. PULMONARY EDEMA: Manage as above.Started Lasix 20 mg by mouth daily by cardiology  5. Polycythemia- chronic , on hydrourea , LDH elevated at 372, sees Dr ShaAlen Blewill set up follow-up with him , case management not replaced. White count elevated likely secondary to steroids vs polycythemia  Discharge Exam:  Blood pressure 120/86, pulse 81, temperature 98.3 F (36.8 C), temperature source Oral, resp. rate 12, height 5' 9" (1.753 m), weight 83.9 kg (184 lb 15.5 oz), SpO2 97 %.   General: No distress   Cardiovascular: s1,s2 rrr  Respiratory: few crackels LL  Abdomen: soft, nt,nd   Musculoskeletal: no led edema    Follow-up Information    Follow up with KELT[facility] SineD.   Specialty:  Cardiology   Why:  our office will call you with a follow-up cardiology appointment in 1-2 weeks   Contact information:   [address] [place]  [address] [phone]       Follow up with Cathlean Cower, MD. Schedule an appointment as soon as possible for a visit in 3 days.   Specialties:  Internal Medicine, Radiology   Contact information:   [address] [place] [address] [phone]       Follow up with Shelva Majestic A, MD. Schedule an appointment as soon as possible for a visit in 3 days.   Specialty:  Cardiology   Contact information:   [address] [place] [address] [phone]       Signed: Reyne Dumas 09/06/2015, 2:56 PM        Time spent >45 mins

## 2015-09-07 ENCOUNTER — Telehealth: Payer: Self-pay | Admitting: Oncology

## 2015-09-07 ENCOUNTER — Telehealth: Payer: Self-pay | Admitting: *Deleted

## 2015-09-07 NOTE — Telephone Encounter (Signed)
Transition Care Management Follow-up Telephone Call  Date discharged? 09/06/15  How have you been since you were released from the hospital? Pt states he is doing pretty good   Do you understand why you were in the hospital? YES   Do you understand the discharge instructions? YES   Where were you discharged to? Home   Items Reviewed:  Medications reviewed: YES  Allergies reviewed: YES  Dietary changes reviewed: YES  Referrals reviewed: YES   Functional Questionnaire:   Activities of Daily Living (ADLs):   He states he are independent in the following: ambulation, bathing and hygiene, feeding, continence, grooming, toileting and dressing States he doesn't require assistance    Any transportation issues/concerns?: NO   Any patient concerns? NO   Confirmed importance and date/time of follow-up visits scheduled YES  Provider Appointment booked with 09/12/15  Confirmed with patient if condition begins to worsen call PCP or go to the ER.  Patient was given the office number and encouraged to call back with question or concerns.  : YES

## 2015-09-07 NOTE — Telephone Encounter (Signed)
[place] F/U ON 09/22/15 AND MAILED OUT [facility]

## 2015-09-12 ENCOUNTER — Ambulatory Visit (INDEPENDENT_AMBULATORY_CARE_PROVIDER_SITE_OTHER): Payer: PRIVATE HEALTH INSURANCE | Admitting: Internal Medicine

## 2015-09-12 ENCOUNTER — Other Ambulatory Visit (INDEPENDENT_AMBULATORY_CARE_PROVIDER_SITE_OTHER): Payer: PRIVATE HEALTH INSURANCE

## 2015-09-12 ENCOUNTER — Telehealth: Payer: Self-pay

## 2015-09-12 ENCOUNTER — Encounter: Payer: Self-pay | Admitting: Internal Medicine

## 2015-09-12 VITALS — BP 130/86 | HR 81 | Temp 98.3°F | Ht 69.0 in | Wt 188.0 lb

## 2015-09-12 DIAGNOSIS — N183 Chronic kidney disease, stage 3 unspecified: Secondary | ICD-10-CM

## 2015-09-12 DIAGNOSIS — D751 Secondary polycythemia: Secondary | ICD-10-CM

## 2015-09-12 DIAGNOSIS — J189 Pneumonia, unspecified organism: Secondary | ICD-10-CM

## 2015-09-12 DIAGNOSIS — I5021 Acute systolic (congestive) heart failure: Secondary | ICD-10-CM

## 2015-09-12 HISTORY — DX: Chronic kidney disease, stage 3 unspecified: N18.30

## 2015-09-12 LAB — CBC WITH DIFFERENTIAL/PLATELET
BASOS ABS: 0 10*3/uL (ref 0.0–0.1)
Basophils Relative: 0.1 % (ref 0.0–3.0)
EOS ABS: 0.2 10*3/uL (ref 0.0–0.7)
EOS PCT: 1.3 % (ref 0.0–5.0)
HCT: 60.7 % (ref 39.0–52.0)
Hemoglobin: 18.8 g/dL (ref 13.0–17.0)
LYMPHS ABS: 1.9 10*3/uL (ref 0.7–4.0)
Lymphocytes Relative: 12.3 % (ref 12.0–46.0)
MCHC: 30.9 g/dL (ref 30.0–36.0)
MCV: 85 fl (ref 78.0–100.0)
MONO ABS: 0.9 10*3/uL (ref 0.1–1.0)
Monocytes Relative: 5.6 % (ref 3.0–12.0)
NEUTROS PCT: 80.7 % — AB (ref 43.0–77.0)
Neutro Abs: 12.2 10*3/uL — ABNORMAL HIGH (ref 1.4–7.7)
Platelets: 657 10*3/uL — ABNORMAL HIGH (ref 150.0–400.0)
RBC: 7.15 Mil/uL — AB (ref 4.22–5.81)
RDW: 18.1 % — ABNORMAL HIGH (ref 11.5–15.5)
WBC: 15.1 10*3/uL — AB (ref 4.0–10.5)

## 2015-09-12 NOTE — Progress Notes (Signed)
Subjective:    Patient ID: Dillon [name], male    DOB: 03/09/1946, 69 y.o.   MRN: [id number]  HPI  Here to f/u recent hospn with bibasilar CAP by CT angio, no PE also diast chf.  Finished antibx, denies fever, cough,  Pt denies chest pain, increasing sob or doe, wheezing, orthopnea, PND, increased LE swelling, palpitations, dizziness or syncope.  Pt denies new neurological symptoms such as new headache, or facial or extremity weakness or numbness.  Pt denies polydipsia, polyuria. Asks for cialis refill.  Due for lab f/u today, Has not seen Dr Alen Blew in 1-2 yrs per pt.   Past Medical History  Diagnosis Date  . ERECTILE DYSFUNCTION 11/15/2008  . FATIGUE 11/15/2008  . GERD 11/15/2008  . HYPERLIPIDEMIA 11/15/2008  . LEG PAIN, LEFT 11/05/2010  . MIGRAINE, COMMON 08/06/2010  . Nocturia 12/19/2008  . Swelling of limb 11/05/2010  . THROMBOCYTOSIS 12/19/2008    myleoproliferative disorder  . Impaired glucose tolerance 04/18/2011  . Bladder neck obstruction 04/18/2011  . Polycythemia 04/27/2012  . LVH (left ventricular hypertrophy) 04/27/2012  . Essential hypertension, benign 04/28/2013  . History of adenomatous polyp of colon 05/06/2014    2010  . Arthritis     right shoulder   Past Surgical History  Procedure Laterality Date  . Appendectomy    . Tonsillectomy    . S/p esophageal dilation    . Upper gastrointestinal endoscopy    . Colonoscopy    . Polypectomy    . Circumcision      30+yrs ago  . Cardiac catheterization N/A 09/05/2015    Procedure: Left Heart Cath and Coronary Angiography;  Surgeon: Troy Sine, MD;  Location: [facility];  Service: Cardiovascular;  Laterality: N/A;    reports that he quit smoking about 45 years ago. His smoking use included Cigarettes. He has a 6 pack-year smoking history. He has never used smokeless tobacco. He reports that he drinks alcohol. He reports that he does not use illicit drugs. family history includes Cancer in his father; Hypertension in his  brother, mother, and sister; Prostate cancer in his father. There is no history of Colon cancer, Rectal cancer, Stomach cancer, Esophageal cancer, or Pancreatic cancer. No Known Allergies Current Outpatient Prescriptions on File Prior to Visit  Medication Sig Dispense Refill  . aspirin 81 MG tablet Take 81 mg by mouth daily.      . carvedilol (COREG) 12.5 MG tablet Take 1 tablet (12.5 mg total) by mouth 2 (two) times daily with a meal. 60 tablet 1  . furosemide (LASIX) 20 MG tablet Take 1 tablet (20 mg total) by mouth daily. 30 tablet 1  . guaiFENesin (MUCINEX) 600 MG 12 hr tablet Take 1 tablet (600 mg total) by mouth 2 (two) times daily. 30 tablet 0  . hydroxyurea (HYDREA) 500 MG capsule Take 1 capsule (500 mg total) by mouth 2 (two) times daily. May take with food to minimize GI side effects. 90 capsule 3  . lisinopril (PRINIVIL,ZESTRIL) 5 MG tablet Take 1 tablet (5 mg total) by mouth daily. 30 tablet 0  . lovastatin (MEVACOR) 40 MG tablet TAKE 1 TABLET (40 MG TOTAL) BY MOUTH DAILY. 90 tablet 1  . pantoprazole (PROTONIX) 40 MG tablet Take 1 tablet (40 mg total) by mouth daily. 30 tablet 0  . tamsulosin (FLOMAX) 0.4 MG CAPS capsule TAKE 1 CAPSULE (0.4 MG TOTAL) BY MOUTH DAILY. 90 capsule 1  . azithromycin (ZITHROMAX) 500 MG tablet Take 1 tablet (  500 mg total) by mouth daily. (Patient not taking: Reported on 09/12/2015) 5 tablet 0   No current facility-administered medications on file prior to visit.   Review of Systems  Constitutional: Negative for unusual diaphoresis or night sweats HENT: Negative for ringing in ear or discharge Eyes: Negative for double vision or worsening visual disturbance.  Respiratory: Negative for choking and stridor.   Gastrointestinal: Negative for vomiting or other signifcant bowel change Genitourinary: Negative for hematuria or change in urine volume.  Musculoskeletal: Negative for other MSK pain or swelling Skin: Negative for color change and worsening wound.    Neurological: Negative for tremors and numbness other than noted  Psychiatric/Behavioral: Negative for decreased concentration or agitation other than above       Objective:   Physical Exam BP 130/86 mmHg  Pulse 81  Temp(Src) 98.3 F (36.8 C) (Oral)  Ht 5\' 9"  (1.753 m)  Wt 188 lb (85.276 kg)  BMI 27.75 kg/m2  SpO2 96% VS noted,  Constitutional: Pt appears in no significant distress HENT: Head: NCAT.  Right Ear: External ear normal.  Left Ear: External ear normal.  Eyes: . Pupils are equal, round, and reactive to light. Conjunctivae and EOM are normal Neck: Normal range of motion. Neck supple.  Cardiovascular: Normal rate and regular rhythm.   Pulmonary/Chest: Effort normal and breath sounds without rales or wheezing.  Abd:  Soft, NT, ND, + BS Neurological: Pt is alert. Not confused , motor grossly intact Skin: Skin is warm. No rash, no LE edema Psychiatric: Pt behavior is normal. No agitation.     Assessment & Plan:

## 2015-09-12 NOTE — Assessment & Plan Note (Signed)
Compensated,  to f/u any worsening symptoms or concerns  

## 2015-09-12 NOTE — Patient Instructions (Addendum)
Please continue all other medications as before, and refills have been done if requested.  Please have the pharmacy call with any other refills you may need.  Please continue your efforts at being more active, low cholesterol diet, and weight control.  You are otherwise up to date with prevention measures today.  Please keep your appointments with your specialists as you may have planned  You will be contacted regarding the referral for: Dr Alen Blew  Please go to the LAB in the Basement (turn left off the elevator) for the tests to be done today  You will be contacted by phone if any changes need to be made immediately.  Otherwise, you will receive a letter about your results with an explanation, but please check with MyChart first.  Please remember to sign up for MyChart if you have not done so, as this will be important to you in the future with finding out test results, communicating by private email, and scheduling acute appointments online when needed.  OK to change your Jan 4 appt with me to 1-2 mo later if you like

## 2015-09-12 NOTE — Telephone Encounter (Signed)
Critical Value - Hemoglobin 18.8  MD verbal informed

## 2015-09-12 NOTE — Assessment & Plan Note (Signed)
Clinically resolved,  to f/u any worsening symptoms or concerns 

## 2015-09-12 NOTE — Progress Notes (Signed)
Pre visit review using our clinic review tool, if applicable. No additional management support is needed unless otherwise documented below in the visit note. 

## 2015-09-12 NOTE — Assessment & Plan Note (Signed)
stable overall by history and exam, recent data reviewed with pt, and pt to continue medical treatment as before,  to f/u any worsening symptoms or concerns, for f/u lab Lab Results  Component Value Date   CREATININE 1.37* 09/06/2015

## 2015-09-12 NOTE — Assessment & Plan Note (Signed)
Asympt, for f/u labs, also refer to Dr Shadad/hematology

## 2015-09-13 ENCOUNTER — Encounter: Payer: Self-pay | Admitting: Internal Medicine

## 2015-09-13 LAB — BASIC METABOLIC PANEL
BUN: 24 mg/dL — ABNORMAL HIGH (ref 6–23)
CALCIUM: 10 mg/dL (ref 8.4–10.5)
CO2: 30 meq/L (ref 19–32)
Chloride: 100 mEq/L (ref 96–112)
Creatinine, Ser: 1.47 mg/dL (ref 0.40–1.50)
GFR: 60.57 mL/min (ref >60.00)
Glucose, Bld: 95 mg/dL (ref 70–99)
POTASSIUM: 4.4 meq/L (ref 3.5–5.1)
SODIUM: 138 meq/L (ref 135–145)

## 2015-09-20 ENCOUNTER — Encounter: Payer: PRIVATE HEALTH INSURANCE | Admitting: Internal Medicine

## 2015-09-21 ENCOUNTER — Encounter: Payer: Self-pay | Admitting: Physician Assistant

## 2015-09-21 ENCOUNTER — Ambulatory Visit (INDEPENDENT_AMBULATORY_CARE_PROVIDER_SITE_OTHER): Payer: PRIVATE HEALTH INSURANCE | Admitting: Physician Assistant

## 2015-09-21 VITALS — BP 128/80 | HR 110 | Ht 69.0 in | Wt 188.0 lb

## 2015-09-21 DIAGNOSIS — I5022 Chronic systolic (congestive) heart failure: Secondary | ICD-10-CM

## 2015-09-21 DIAGNOSIS — I471 Supraventricular tachycardia: Secondary | ICD-10-CM | POA: Diagnosis not present

## 2015-09-21 DIAGNOSIS — I10 Essential (primary) hypertension: Secondary | ICD-10-CM

## 2015-09-21 NOTE — Progress Notes (Signed)
Cardiology Office Note   Date:  09/21/2015   ID:  Dillon [name], DOB 1946-02-01, MRN [id number]  PCP:  Cathlean Cower, MD  Cardiologist:  Dr Meredeth Ide, PA-C   Chief Complaint  Patient presents with  . Hospitalization Follow-up    no chest pain, no shortness of breath, no edema, no pain or cramping in legs, no lightheaded or dizzinesss    History of Present Illness: Dillon [name] is a 70 y.o. male with a history of hypertension, hyperlipidemia, polycythemia, thrombocytosis, GERD, former smoker (quit 1972) and LVH w/ EF 60% 2013  Admitted 12/15-12/21 w/ SVT, S-CHF, NICM w/ EF 15-20% by echo, nl cors at cath  Dillon [name] presents for post hospital follow-up.   He has been doing well with a low sodium diet. He is not adding salt to things. He is not eating crackers or chips. He is trying to choose low-sodium options at lunch. He is tracking his weight and it is stable. He is not having dyspnea on exertion, shortness of breath at rest, orthopnea or PND. He has gone back to work and tolerating it well. No chest pain.  He does not have any palpitations. He has no presyncope or syncope. His he was being examined, his heart rate was irregular at intervals and he was completely unaware of this.   Past Medical History  Diagnosis Date  . ERECTILE DYSFUNCTION 11/15/2008  . FATIGUE 11/15/2008  . GERD 11/15/2008  . HYPERLIPIDEMIA 11/15/2008  . LEG PAIN, LEFT 11/05/2010  . MIGRAINE, COMMON 08/06/2010  . Nocturia 12/19/2008  . Swelling of limb 11/05/2010  . THROMBOCYTOSIS 12/19/2008    myleoproliferative disorder  . Impaired glucose tolerance 04/18/2011  . Bladder neck obstruction 04/18/2011  . Polycythemia 04/27/2012  . LVH (left ventricular hypertrophy) 04/27/2012  . Essential hypertension, benign 04/28/2013  . History of adenomatous polyp of colon 05/06/2014    2010  . Arthritis     right shoulder  . CKD (chronic kidney disease), stage III 09/12/2015    Past Surgical  History  Procedure Laterality Date  . Appendectomy    . Tonsillectomy    . S/p esophageal dilation    . Upper gastrointestinal endoscopy    . Colonoscopy    . Polypectomy    . Circumcision      30+yrs ago  . Cardiac catheterization N/A 09/05/2015    Procedure: Left Heart Cath and Coronary Angiography;  Surgeon: Troy Sine, MD;  Location: [facility];  Service: Cardiovascular;  Laterality: N/A;    Current Outpatient Prescriptions  Medication Sig Dispense Refill  . amLODipine (NORVASC) 5 MG tablet TAKE 1 TABLET (5 MG TOTAL) BY MOUTH DAILY.  1  . aspirin 81 MG tablet Take 81 mg by mouth daily.      . carvedilol (COREG) 12.5 MG tablet Take 1 tablet (12.5 mg total) by mouth 2 (two) times daily with a meal. 60 tablet 1  . furosemide (LASIX) 20 MG tablet Take 1 tablet (20 mg total) by mouth daily. 30 tablet 1  . guaiFENesin (MUCINEX) 600 MG 12 hr tablet Take 1 tablet (600 mg total) by mouth 2 (two) times daily. 30 tablet 0  . hydroxyurea (HYDREA) 500 MG capsule Take 1 capsule (500 mg total) by mouth 2 (two) times daily. May take with food to minimize GI side effects. 90 capsule 3  . lisinopril (PRINIVIL,ZESTRIL) 5 MG tablet Take 1 tablet (5 mg total) by mouth daily. 30 tablet 0  .  lovastatin (MEVACOR) 40 MG tablet TAKE 1 TABLET (40 MG TOTAL) BY MOUTH DAILY. 90 tablet 1  . pantoprazole (PROTONIX) 40 MG tablet Take 1 tablet (40 mg total) by mouth daily. 30 tablet 0  . tamsulosin (FLOMAX) 0.4 MG CAPS capsule TAKE 1 CAPSULE (0.4 MG TOTAL) BY MOUTH DAILY. 90 capsule 1   No current facility-administered medications for this visit.    Allergies:   Review of patient's allergies indicates no known allergies.    Social History:  The patient  reports that he quit smoking about 45 years ago. His smoking use included Cigarettes. He has a 6 pack-year smoking history. He has never used smokeless tobacco. He reports that he drinks alcohol. He reports that he does not use illicit drugs.    Family History:  The patient's family history includes Cancer in his father; Hypertension in his brother, mother, and sister; Prostate cancer in his father. There is no history of Colon cancer, Rectal cancer, Stomach cancer, Esophageal cancer, or Pancreatic cancer.    ROS:  Please see the history of present illness. All other systems are reviewed and negative.    PHYSICAL EXAM: VS:  BP 128/80 mmHg  Pulse 110  Ht 5\' 9"  (1.753 m)  Wt 188 lb (85.276 kg)  BMI 27.75 kg/m2 , BMI Body mass index is 27.75 kg/(m^2). GEN: Well nourished, well developed, male in no acute distress HEENT: normal for age  Neck: no JVD, no carotid bruit, no masses Cardiac: Irregular in that his heart rate speeds up at times; no murmur, no rubs, or gallops Respiratory:  clear to auscultation bilaterally, normal work of breathing GI: soft, nontender, nondistended, + BS MS: no deformity or atrophy; no edema; distal pulses are 2+ in all 4 extremities  Skin: warm and dry, no rash Neuro:  Strength and sensation are intact Psych: euthymic mood, full affect   EKG:  EKG is ordered today. The ekg ordered today demonstrates sinus rhythm with frequent bursts of SVT. 12-lead rhythm strip performed to confirm this and does   Recent Labs: 08/31/2015: B Natriuretic Peptide 705.8*; Magnesium 2.0 09/03/2015: TSH 0.887 09/06/2015: ALT 61 09/12/2015: BUN 24*; Creatinine, Ser 1.47; Hemoglobin 18.8 Repeated and verified X2.*; Platelets 657.0*; Potassium 4.4; Sodium 138    Lipid Panel    Component Value Date/Time   CHOL 125 11/11/2014 1715   TRIG 132.0 11/11/2014 1715   HDL 55.60 11/11/2014 1715   CHOLHDL 2 11/11/2014 1715   VLDL 26.4 11/11/2014 1715   LDLCALC 43 11/11/2014 1715   LDLDIRECT 101.0 10/29/2013 1713     Wt Readings from Last 3 Encounters:  09/21/15 188 lb (85.276 kg)  09/12/15 188 lb (85.276 kg)  09/03/15 184 lb 15.5 oz (83.9 kg)     Other studies Reviewed: Additional studies/ records that were  reviewed today include: Hospital records, labs and ECGs.  ASSESSMENT AND PLAN:  1.  Chronic systolic CHF: His weight is stable and he is doing well. He is encouraged to continue daily weights, continue low-sodium diet and taking his medications. We will check a BMET in a week or 2.  2. SVT: He is having frequent bursts of SVT. He is asymptomatic with them and unaware. His TSH was checked during his recent hospitalization and was normal. His blood pressure is not high enough to increase his beta blocker. He is compliant with the carvedilol twice a day.  3. Hypertension: His blood pressure is under good control, no changes  ECG reviewed with Dr. Ellyn Hack. He agrees that  EP referral is indicated and we will do this.   Current medicines are reviewed at length with the patient today.  The patient does not have concerns regarding medicines.  The following changes have been made:  no change  Labs/ tests ordered today include:   Orders Placed This Encounter  Procedures  . EKG 12-Lead     Disposition:   FU with Dr. Claiborne Billings in 3 months and EP first available  Signed, [name]  09/21/2015 4:38 PM    [address], [place], [place]  [address] Phone: [phone]; Fax: [phone]

## 2015-09-21 NOTE — Patient Instructions (Signed)
Your physician recommends that you schedule a follow-up appointment in: 3 months with Dr. Kelly. 

## 2015-09-22 ENCOUNTER — Ambulatory Visit (HOSPITAL_BASED_OUTPATIENT_CLINIC_OR_DEPARTMENT_OTHER): Payer: PRIVATE HEALTH INSURANCE | Admitting: Oncology

## 2015-09-22 ENCOUNTER — Telehealth: Payer: Self-pay | Admitting: Oncology

## 2015-09-22 VITALS — BP 130/88 | HR 83 | Temp 97.8°F | Resp 18 | Ht 69.0 in | Wt 188.3 lb

## 2015-09-22 DIAGNOSIS — Z9114 Patient's other noncompliance with medication regimen: Secondary | ICD-10-CM | POA: Diagnosis not present

## 2015-09-22 DIAGNOSIS — D75839 Thrombocytosis, unspecified: Secondary | ICD-10-CM

## 2015-09-22 DIAGNOSIS — D473 Essential (hemorrhagic) thrombocythemia: Secondary | ICD-10-CM

## 2015-09-22 MED ORDER — HYDROXYUREA 500 MG PO CAPS
500.0000 mg | ORAL_CAPSULE | Freq: Two times a day (BID) | ORAL | Status: DC
Start: 2015-09-22 — End: 2016-04-04

## 2015-09-22 NOTE — Telephone Encounter (Signed)
per pof to sch pt appt-gave pt copy of avs °

## 2015-09-22 NOTE — Progress Notes (Signed)
Hematology and Oncology Follow Up Visit  TRIGG DELAROCHA [id number] 25-Jul-1946 70 y.o. 09/22/2015 4:06 PM  CC: Biagio Borg, MD    Principle Diagnosis: This is a 70 year old gentleman with a thrombocytosis JAK2 positive diagnosed in March 2010 presented with myeloproliferative disorder confirmed by a bone marrow biopsy.   Current therapy:  1. He is supposed to be Hydrea 500 mg twice a day. He has not been taking this regularly since his diagnosis. 2. He is on aspirin 81 mg daily.    Interim History: Mr. Crevier presents today for a followup visit. Since his last visit, he have failed to follow-up for the last 2 years. He has not been taking hydroxyurea and have been doing relatively well. He was hospitalized in December 2016 for community-acquired pneumonia as well as SVT. During his hospitalization, he was found to have elevated platelet count up to 1060. His hemoglobin was also elevated around 19.  Repeat a CBC on 09/12/2015 showed a hemoglobin of 18.8, white cell count of 15.1 and a platelet count of 657. He is asymptomatic from these findings without any recent thrombosis episodes. He does not report any migraine headaches, syncope or seizures. He resumed all his work-related duties without any decline.   He does not report any syncope, dizziness or seizures. He does not report any fevers or chills or sweats. He did not report any chest pain or difficulty breathing.  He did not report any nausea.  He did not report any vomiting.  He does not report any other satiety or abdominal distention. He does not report any frequency urgency or hesitancy. He does not report any skeletal complaints. Remaining review of systems unremarkable.   Medications: I have reviewed the patient's current medications.   Current Outpatient Prescriptions  Medication Sig Dispense Refill  . amLODipine (NORVASC) 5 MG tablet TAKE 1 TABLET (5 MG TOTAL) BY MOUTH DAILY.  1  . aspirin 81 MG tablet Take 81 mg by  mouth daily.      . carvedilol (COREG) 12.5 MG tablet Take 1 tablet (12.5 mg total) by mouth 2 (two) times daily with a meal. 60 tablet 1  . furosemide (LASIX) 20 MG tablet Take 1 tablet (20 mg total) by mouth daily. 30 tablet 1  . guaiFENesin (MUCINEX) 600 MG 12 hr tablet Take 1 tablet (600 mg total) by mouth 2 (two) times daily. 30 tablet 0  . hydroxyurea (HYDREA) 500 MG capsule Take 1 capsule (500 mg total) by mouth 2 (two) times daily. May take with food to minimize GI side effects. 90 capsule 3  . lisinopril (PRINIVIL,ZESTRIL) 5 MG tablet Take 1 tablet (5 mg total) by mouth daily. 30 tablet 0  . lovastatin (MEVACOR) 40 MG tablet TAKE 1 TABLET (40 MG TOTAL) BY MOUTH DAILY. 90 tablet 1  . pantoprazole (PROTONIX) 40 MG tablet Take 1 tablet (40 mg total) by mouth daily. 30 tablet 0  . tamsulosin (FLOMAX) 0.4 MG CAPS capsule TAKE 1 CAPSULE (0.4 MG TOTAL) BY MOUTH DAILY. 90 capsule 1   No current facility-administered medications for this visit.    Allergies: No Known Allergies  Past Medical History, Surgical history, Social history, and Family History were reviewed and updated.   Physical Exam: Blood pressure 130/88, pulse 83, temperature 97.8 F (36.6 C), temperature source Oral, resp. rate 18, height _0  (1.753 m), weight 188 lb 4.8 oz (85.412 kg), SpO2 99 %. ECOG: 1 General appearance: alert awake gentleman without distress. Head: Normocephalic, without obvious abnormality  Neck: no adenopathy and no carotid bruit Lymph nodes: Cervical, supraclavicular, and axillary nodes normal. Heart:regular rate and rhythm, S1, S2 normal, no murmur, click, rub or gallop Lung:chest clear, no wheezing, rales, normal symmetric air entry Abdomin: soft, non-tender, without masses or organomegaly no shifting dullness or ascites. EXT:no erythema, induration, or nodules  Lab Results: Lab Results  Component Value Date   WBC 15.1* 09/12/2015   HGB 18.8 Repeated and verified X2.* 09/12/2015   HCT 60.7  Repeated and verified X2.* 09/12/2015   MCV 85.0 09/12/2015   PLT 657.0* 09/12/2015     Chemistry      Component Value Date/Time   NA 138 09/12/2015 1725   K 4.4 09/12/2015 1725   CL 100 09/12/2015 1725   CO2 30 09/12/2015 1725   BUN 24* 09/12/2015 1725   CREATININE 1.47 09/12/2015 1725      Component Value Date/Time   CALCIUM 10.0 09/12/2015 1725   ALKPHOS 63 09/06/2015 0352   AST 27 09/06/2015 0352   ALT 61 09/06/2015 0352   BILITOT 0.8 09/06/2015 0352     Impression and Plan:  ASSESSMENT/PLAN:  This is a pleasant 70 year old gentleman with the following issues. 1. Essential thrombocythemia. This was diagnosed back in 2012 and was treated with hydroxyurea starting in April of 2013. But has been noncompliant in his medication as well as follow-up. Last time was seen was in 2015 and have been off hydroxyurea for over a year. His hemoglobin and platelet count are elevated but he is asymptomatic. I have recommended restarting hydroxyurea 500 mg twice a day to reduce his hemoglobin and platelet count. He understands without this maneuver, he is at risk of developing strokes and myocardial infarction. He is agreeable to take this medication at this time. I will recheck his counts in 6 weeks and in 3 months.  2. Thrombosis prophylaxis.  He continues to be on low-dose aspirin.   3. Follow up in 6 weeks to recheck his counts and if they are adequate will followup again in 3 months.       ZBMZTA,EWYBR 1/6/20174:06 PM

## 2015-11-02 ENCOUNTER — Telehealth: Payer: Self-pay | Admitting: *Deleted

## 2015-11-02 NOTE — Telephone Encounter (Signed)
"  When is my appointment with Dr. Alen Blew?" Lab appointment tomorrow, 11-03-2015 at 4:00 pm.  12-22-2015 lab and F/U begins at 3:15 pm.  No further questions.

## 2015-11-03 ENCOUNTER — Other Ambulatory Visit (HOSPITAL_BASED_OUTPATIENT_CLINIC_OR_DEPARTMENT_OTHER): Payer: PRIVATE HEALTH INSURANCE

## 2015-11-03 DIAGNOSIS — D473 Essential (hemorrhagic) thrombocythemia: Secondary | ICD-10-CM | POA: Diagnosis not present

## 2015-11-03 DIAGNOSIS — D75839 Thrombocytosis, unspecified: Secondary | ICD-10-CM

## 2015-11-03 LAB — COMPREHENSIVE METABOLIC PANEL
ALBUMIN: 4 g/dL (ref 3.5–5.0)
ALK PHOS: 74 U/L (ref 40–150)
ALT: 29 U/L (ref 0–55)
ANION GAP: 11 meq/L (ref 3–11)
AST: 21 U/L (ref 5–34)
BUN: 16.5 mg/dL (ref 7.0–26.0)
CALCIUM: 9.4 mg/dL (ref 8.4–10.4)
CHLORIDE: 106 meq/L (ref 98–109)
CO2: 24 mEq/L (ref 22–29)
Creatinine: 1.2 mg/dL (ref 0.7–1.3)
EGFR: 68 mL/min/{1.73_m2} — AB (ref >90)
Glucose: 93 mg/dl (ref 70–140)
POTASSIUM: 4.3 meq/L (ref 3.5–5.1)
Sodium: 141 mEq/L (ref 136–145)
Total Bilirubin: 0.78 mg/dL (ref 0.20–1.20)
Total Protein: 6.9 g/dL (ref 6.4–8.3)

## 2015-11-03 LAB — CBC WITH DIFFERENTIAL/PLATELET
BASO%: 0.2 % (ref 0.0–2.0)
BASOS ABS: 0 10*3/uL (ref 0.0–0.1)
EOS%: 2.5 % (ref 0.0–7.0)
Eosinophils Absolute: 0.3 10*3/uL (ref 0.0–0.5)
HEMATOCRIT: 60.3 % — AB (ref 38.4–49.9)
HEMOGLOBIN: 19.4 g/dL — AB (ref 13.0–17.1)
LYMPH%: 17 % (ref 14.0–49.0)
MCH: 28.6 pg (ref 27.2–33.4)
MCHC: 32.2 g/dL (ref 32.0–36.0)
MCV: 88.8 fL (ref 79.3–98.0)
MONO#: 0.9 10*3/uL (ref 0.1–0.9)
MONO%: 6.4 % (ref 0.0–14.0)
NEUT#: 9.9 10*3/uL — ABNORMAL HIGH (ref 1.5–6.5)
NEUT%: 73.9 % (ref 39.0–75.0)
Platelets: 605 10*3/uL — ABNORMAL HIGH (ref 140–400)
RBC: 6.79 10*6/uL — ABNORMAL HIGH (ref 4.20–5.82)
RDW: 18.9 % — ABNORMAL HIGH (ref 11.0–14.6)
WBC: 13.4 10*3/uL — ABNORMAL HIGH (ref 4.0–10.3)
lymph#: 2.3 10*3/uL (ref 0.9–3.3)
nRBC: 0 % (ref 0–0)

## 2015-11-05 ENCOUNTER — Other Ambulatory Visit: Payer: Self-pay | Admitting: Internal Medicine

## 2015-12-04 ENCOUNTER — Encounter: Payer: Self-pay | Admitting: Cardiology

## 2015-12-22 ENCOUNTER — Other Ambulatory Visit: Payer: PRIVATE HEALTH INSURANCE

## 2015-12-22 ENCOUNTER — Ambulatory Visit: Payer: PRIVATE HEALTH INSURANCE | Admitting: Oncology

## 2016-01-10 ENCOUNTER — Other Ambulatory Visit: Payer: Self-pay | Admitting: Internal Medicine

## 2016-01-16 ENCOUNTER — Other Ambulatory Visit: Payer: Self-pay | Admitting: *Deleted

## 2016-01-16 MED ORDER — LISINOPRIL 5 MG PO TABS: 5.0000 mg | ORAL_TABLET | Freq: Every day | ORAL | Status: DC

## 2016-01-16 MED ORDER — CARVEDILOL 12.5 MG PO TABS: 12.5000 mg | ORAL_TABLET | Freq: Two times a day (BID) | ORAL | Status: DC

## 2016-01-16 MED ORDER — AMLODIPINE BESYLATE 5 MG PO TABS: ORAL_TABLET | ORAL | Status: DC

## 2016-01-16 NOTE — Telephone Encounter (Signed)
Received call from pt wife stating pt needing refills on BP meds. Verified which medication sent rx's to [facility].../lmb

## 2016-01-23 ENCOUNTER — Encounter: Payer: Self-pay | Admitting: Internal Medicine

## 2016-01-23 ENCOUNTER — Other Ambulatory Visit (INDEPENDENT_AMBULATORY_CARE_PROVIDER_SITE_OTHER): Payer: Managed Care, Other (non HMO)

## 2016-01-23 ENCOUNTER — Ambulatory Visit (INDEPENDENT_AMBULATORY_CARE_PROVIDER_SITE_OTHER): Payer: Managed Care, Other (non HMO) | Admitting: Internal Medicine

## 2016-01-23 VITALS — BP 130/78 | HR 72 | Temp 98.3°F | Resp 20 | Wt 194.0 lb

## 2016-01-23 DIAGNOSIS — F528 Other sexual dysfunction not due to a substance or known physiological condition: Secondary | ICD-10-CM

## 2016-01-23 DIAGNOSIS — Z0001 Encounter for general adult medical examination with abnormal findings: Secondary | ICD-10-CM

## 2016-01-23 DIAGNOSIS — R7302 Impaired glucose tolerance (oral): Secondary | ICD-10-CM

## 2016-01-23 DIAGNOSIS — I10 Essential (primary) hypertension: Secondary | ICD-10-CM | POA: Diagnosis not present

## 2016-01-23 DIAGNOSIS — Z1159 Encounter for screening for other viral diseases: Secondary | ICD-10-CM | POA: Diagnosis not present

## 2016-01-23 DIAGNOSIS — R6889 Other general symptoms and signs: Secondary | ICD-10-CM

## 2016-01-23 DIAGNOSIS — M7032 Other bursitis of elbow, left elbow: Secondary | ICD-10-CM | POA: Diagnosis not present

## 2016-01-23 LAB — URINALYSIS, ROUTINE W REFLEX MICROSCOPIC
Bilirubin Urine: NEGATIVE
Ketones, ur: NEGATIVE
LEUKOCYTES UA: NEGATIVE
Nitrite: NEGATIVE
Specific Gravity, Urine: 1.01 (ref 1.000–1.030)
TOTAL PROTEIN, URINE-UPE24: NEGATIVE
Urine Glucose: NEGATIVE
Urobilinogen, UA: 0.2 (ref 0.0–1.0)
pH: 6 (ref 5.0–8.0)

## 2016-01-23 LAB — CBC WITH DIFFERENTIAL/PLATELET
BASOS PCT: 0.3 % (ref 0.0–3.0)
Basophils Absolute: 0 10*3/uL (ref 0.0–0.1)
EOS PCT: 3.1 % (ref 0.0–5.0)
Eosinophils Absolute: 0.4 10*3/uL (ref 0.0–0.7)
HCT: 56.2 % — ABNORMAL HIGH (ref 39.0–52.0)
Hemoglobin: 17.8 g/dL — ABNORMAL HIGH (ref 13.0–17.0)
LYMPHS ABS: 1.9 10*3/uL (ref 0.7–4.0)
Lymphocytes Relative: 13.2 % (ref 12.0–46.0)
MCHC: 31.8 g/dL (ref 30.0–36.0)
MCV: 91.8 fl (ref 78.0–100.0)
MONOS PCT: 4.3 % (ref 3.0–12.0)
Monocytes Absolute: 0.6 10*3/uL (ref 0.1–1.0)
NEUTROS ABS: 11.3 10*3/uL — AB (ref 1.4–7.7)
NEUTROS PCT: 79.1 % — AB (ref 43.0–77.0)
PLATELETS: 629 10*3/uL — AB (ref 150.0–400.0)
RBC: 6.12 Mil/uL — AB (ref 4.22–5.81)
RDW: 17.7 % — ABNORMAL HIGH (ref 11.5–15.5)
WBC: 14.3 10*3/uL — ABNORMAL HIGH (ref 4.0–10.5)

## 2016-01-23 LAB — HEMOGLOBIN A1C: Hgb A1c MFr Bld: 6 % (ref 4.6–6.5)

## 2016-01-23 MED ORDER — TADALAFIL 20 MG PO TABS: 20.0000 mg | ORAL_TABLET | Freq: Every day | ORAL | Status: DC | PRN

## 2016-01-23 NOTE — Patient Instructions (Signed)

## 2016-01-23 NOTE — Progress Notes (Signed)
Pre visit review using our clinic review tool, if applicable. No additional management support is needed unless otherwise documented below in the visit note. 

## 2016-01-23 NOTE — Progress Notes (Signed)
Subjective:    Patient ID: Dillon [name], male    DOB: Jan 22, 1946, 70 y.o.   MRN: [id number]  HPI  Here for wellness and f/u;  Overall doing ok;  Pt denies Chest pain, worsening SOB, DOE, wheezing, orthopnea, PND, worsening LE edema, palpitations, dizziness or syncope.  Pt denies neurological change such as new headache, facial or extremity weakness.  Pt denies polydipsia, polyuria, or low sugar symptoms. Pt states overall good compliance with treatment and medications, good tolerability, and has been trying to follow appropriate diet.  Pt denies worsening depressive symptoms, suicidal ideation or panic. No fever, night sweats, wt loss, loss of appetite, or other constitutional symptoms.  Pt states good ability with ADL's, has low fall risk, home safety reviewed and adequate, no other significant changes in hearing or vision, and only occasionally active with exercise.  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 No trauma, fever, hx of gout or similar prior.  Has had worsening ED symtpoms in last year as well, just cannot seem to maintain. Past Medical History  Diagnosis Date  . ERECTILE DYSFUNCTION 11/15/2008  . FATIGUE 11/15/2008  . GERD 11/15/2008  . HYPERLIPIDEMIA 11/15/2008  . LEG PAIN, LEFT 11/05/2010  . MIGRAINE, COMMON 08/06/2010  . Nocturia 12/19/2008  . Swelling of limb 11/05/2010  . THROMBOCYTOSIS 12/19/2008    myleoproliferative disorder  . Impaired glucose tolerance 04/18/2011  . Bladder neck obstruction 04/18/2011  . Polycythemia 04/27/2012  . LVH (left ventricular hypertrophy) 04/27/2012  . Essential hypertension, benign 04/28/2013  . History of adenomatous polyp of colon 05/06/2014    2010  . Arthritis     right shoulder  . CKD (chronic kidney disease), stage III 09/12/2015   Past Surgical History  Procedure Laterality Date  . Appendectomy    . Tonsillectomy    . S/p esophageal dilation    . Upper gastrointestinal  endoscopy    . Colonoscopy    . Polypectomy    . Circumcision      30+yrs ago  . Cardiac catheterization N/A 09/05/2015    Procedure: Left Heart Cath and Coronary Angiography;  Surgeon: Troy Sine, MD;  Location: [facility];  Service: Cardiovascular;  Laterality: N/A;    reports that he quit smoking about 45 years ago. His smoking use included Cigarettes. He has a 6 pack-year smoking history. He has never used smokeless tobacco. He reports that he drinks alcohol. He reports that he does not use illicit drugs. family history includes Cancer in his father; Hypertension in his brother, mother, and sister; Prostate cancer in his father. There is no history of Colon cancer, Rectal cancer, Stomach cancer, Esophageal cancer, or Pancreatic cancer. No Known Allergies Current Outpatient Prescriptions on File Prior to Visit  Medication Sig Dispense Refill  . amLODipine (NORVASC) 5 MG tablet TAKE 1 TABLET (5 MG TOTAL) BY MOUTH DAILY. 30 tablet 5  . aspirin 81 MG tablet Take 81 mg by mouth daily.      . carvedilol (COREG) 12.5 MG tablet Take 1 tablet (12.5 mg total) by mouth 2 (two) times daily with a meal. 60 tablet 5  . furosemide (LASIX) 20 MG tablet Take 1 tablet (20 mg total) by mouth daily. 30 tablet 1  . guaiFENesin (MUCINEX) 600 MG 12 hr tablet Take 1 tablet (600 mg total) by mouth 2 (two) times daily. 30 tablet 0  . hydroxyurea (HYDREA) 500 MG capsule Take 1  capsule (500 mg total) by mouth 2 (two) times daily. May take with food to minimize GI side effects. 90 capsule 3  . lisinopril (PRINIVIL,ZESTRIL) 5 MG tablet Take 1 tablet (5 mg total) by mouth daily. 30 tablet 5  . lovastatin (MEVACOR) 40 MG tablet TAKE 1 TABLET (40 MG TOTAL) BY MOUTH DAILY. 90 tablet 0  . pantoprazole (PROTONIX) 40 MG tablet Take 1 tablet (40 mg total) by mouth daily. 30 tablet 0  . tamsulosin (FLOMAX) 0.4 MG CAPS capsule TAKE 1 CAPSULE (0.4 MG TOTAL) BY MOUTH DAILY. 90 capsule 0   No current  facility-administered medications on file prior to visit.   Review of Systems Constitutional: Negative for increased diaphoresis, or other activity, appetite or siginficant weight change other than noted HENT: Negative for worsening hearing loss, ear pain, facial swelling, mouth sores and neck stiffness.   Eyes: Negative for other worsening pain, redness or visual disturbance.  Respiratory: Negative for choking or stridor Cardiovascular: Negative for other chest pain and palpitations.  Gastrointestinal: Negative for worsening diarrhea, blood in stool, or abdominal distention Genitourinary: Negative for hematuria, flank pain or change in urine volume.  Musculoskeletal: Negative for myalgias or other joint complaints.  Skin: Negative for other color change and wound or drainage.  Neurological: Negative for syncope and numbness. other than noted Hematological: Negative for adenopathy. or other swelling Psychiatric/Behavioral: Negative for hallucinations, SI, self-injury, decreased concentration or other worsening agitation.      Objective:   Physical Exam BP 130/78 mmHg  Pulse 72  Temp(Src) 98.3 F (36.8 C) (Oral)  Resp 20  Wt 194 lb (87.998 kg)  SpO2 95% VS noted,  Constitutional: Pt is oriented to person, place, and time. Appears well-developed and well-nourished, in no significant distress Head: Normocephalic and atraumatic  Eyes: Conjunctivae and EOM are normal. Pupils are equal, round, and reactive to light Right Ear: External ear normal.  Left Ear: External ear normal Nose: Nose normal.  Mouth/Throat: Oropharynx is clear and moist  Neck: Normal range of motion. Neck supple. No JVD present. No tracheal deviation present or significant neck LA or mass Cardiovascular: Normal rate, regular rhythm, normal heart sounds and intact distal pulses.   Pulmonary/Chest: Effort normal and breath sounds without rales or wheezing  Abdominal: Soft. Bowel sounds are normal. NT. No HSM    Musculoskeletal: Normal range of motion. Exhibits no edema Lymphadenopathy: Has no cervical adenopathy.  Neurological: Pt is alert and oriented to person, place, and time. Pt has normal reflexes. No cranial nerve deficit. Motor grossly intact Skin: Skin is warm and dry. No rash noted or new ulcers Psychiatric:  Has normal mood and affect. Behavior is normal.  Left elbow with 2 cm warm, slight erythema, swelling c/w bursitis    Assessment & Plan:

## 2016-01-24 ENCOUNTER — Encounter: Payer: Self-pay | Admitting: Internal Medicine

## 2016-01-24 LAB — LIPID PANEL
CHOL/HDL RATIO: 3
Cholesterol: 147 mg/dL (ref 0–200)
HDL: 52.4 mg/dL (ref >39.00)
LDL Cholesterol: 56 mg/dL (ref 0–99)
NONHDL: 94.4
Triglycerides: 192 mg/dL — ABNORMAL HIGH (ref 0.0–149.0)
VLDL: 38.4 mg/dL (ref 0.0–40.0)

## 2016-01-24 LAB — BASIC METABOLIC PANEL
BUN: 22 mg/dL (ref 6–23)
CALCIUM: 10.2 mg/dL (ref 8.4–10.5)
CHLORIDE: 102 meq/L (ref 96–112)
CO2: 24 meq/L (ref 19–32)
CREATININE: 1.25 mg/dL (ref 0.40–1.50)
GFR: 72.95 mL/min (ref >60.00)
Glucose, Bld: 98 mg/dL (ref 70–99)
Potassium: 4.4 mEq/L (ref 3.5–5.1)
SODIUM: 136 meq/L (ref 135–145)

## 2016-01-24 LAB — PSA: PSA: 0.97 ng/mL (ref 0.10–4.00)

## 2016-01-24 LAB — HEPATIC FUNCTION PANEL
ALK PHOS: 59 U/L (ref 39–117)
ALT: 25 U/L (ref 0–53)
AST: 21 U/L (ref 0–37)
Albumin: 4.6 g/dL (ref 3.5–5.2)
BILIRUBIN DIRECT: 0.2 mg/dL (ref 0.0–0.3)
BILIRUBIN TOTAL: 0.8 mg/dL (ref 0.2–1.2)
TOTAL PROTEIN: 6.9 g/dL (ref 6.0–8.3)

## 2016-01-24 LAB — TSH: TSH: 2.8 u[IU]/mL (ref 0.35–4.50)

## 2016-01-24 LAB — HEPATITIS C ANTIBODY: HCV AB: NEGATIVE

## 2016-01-28 DIAGNOSIS — M7032 Other bursitis of elbow, left elbow: Secondary | ICD-10-CM | POA: Insufficient documentation

## 2016-01-28 NOTE — Assessment & Plan Note (Signed)

## 2016-01-28 NOTE — Assessment & Plan Note (Signed)
stable overall by history and exam, recent data reviewed with pt, and pt to continue medical treatment as before,  to f/u any worsening symptoms or concerns Lab Results  Component Value Date   WBC 14.3* 01/23/2016   HGB 17.8* 01/23/2016   HCT 56.2 Repeated and verified X2.* 01/23/2016   PLT 629.0* 01/23/2016   GLUCOSE 98 01/23/2016   CHOL 147 01/23/2016   TRIG 192.0* 01/23/2016   HDL 52.40 01/23/2016   LDLDIRECT 101.0 10/29/2013   LDLCALC 56 01/23/2016   ALT 25 01/23/2016   AST 21 01/23/2016   NA 136 01/23/2016   K 4.4 01/23/2016   CL 102 01/23/2016   CREATININE 1.25 01/23/2016   BUN 22 01/23/2016   CO2 24 01/23/2016   TSH 2.80 01/23/2016   PSA 0.97 01/23/2016   INR 1.32 09/04/2015   HGBA1C 6.0 01/23/2016

## 2016-01-28 NOTE — Assessment & Plan Note (Addendum)
Mild, does not require specific tx at this time except to avoid trauma, should resolved in next few wks  In addition to the time spent performing CPE, I spent an additional 40 minutes face to face,in which greater than 50% of this time was spent in counseling and coordination of care for patient's acute illness as documented.

## 2016-01-28 NOTE — Assessment & Plan Note (Signed)
stable overall by history and exam, recent data reviewed with pt, and pt to continue medical treatment as before,  to f/u any worsening symptoms or concerns Lab Results  Component Value Date   HGBA1C 6.0 01/23/2016    

## 2016-01-28 NOTE — Assessment & Plan Note (Signed)
stable overall by history and exam, and pt to continue medical treatment as before,  to f/u any worsening symptoms or concerns m=, Ok for med refill,  to f/u any worsening symptoms or concerns

## 2016-01-28 NOTE — Assessment & Plan Note (Signed)
stable overall by history and exam, recent data reviewed with pt, and pt to continue medical treatment as before,  to f/u any worsening symptoms or concerns BP Readings from Last 3 Encounters:  01/23/16 130/78  09/22/15 130/88  09/21/15 128/80

## 2016-04-04 ENCOUNTER — Other Ambulatory Visit: Payer: Self-pay | Admitting: Oncology

## 2016-04-06 ENCOUNTER — Other Ambulatory Visit: Payer: Self-pay | Admitting: Internal Medicine

## 2016-07-17 ENCOUNTER — Encounter: Payer: Self-pay | Admitting: Internal Medicine

## 2016-07-17 ENCOUNTER — Ambulatory Visit (INDEPENDENT_AMBULATORY_CARE_PROVIDER_SITE_OTHER): Payer: Managed Care, Other (non HMO) | Admitting: Internal Medicine

## 2016-07-17 VITALS — BP 130/80 | HR 88 | Temp 98.5°F | Resp 20 | Wt 192.0 lb

## 2016-07-17 DIAGNOSIS — N183 Chronic kidney disease, stage 3 unspecified: Secondary | ICD-10-CM

## 2016-07-17 DIAGNOSIS — E785 Hyperlipidemia, unspecified: Secondary | ICD-10-CM | POA: Diagnosis not present

## 2016-07-17 DIAGNOSIS — I10 Essential (primary) hypertension: Secondary | ICD-10-CM | POA: Diagnosis not present

## 2016-07-17 DIAGNOSIS — R7302 Impaired glucose tolerance (oral): Secondary | ICD-10-CM

## 2016-07-17 MED ORDER — TADALAFIL 20 MG PO TABS: 20.0000 mg | ORAL_TABLET | Freq: Every day | ORAL | 11 refills | Status: DC | PRN

## 2016-07-17 NOTE — Progress Notes (Signed)
Subjective:    Patient ID: Dillon [name], male    DOB: 1946-09-06, 70 y.o.   MRN: [id number]  HPI  Here to f/u; overall doing ok,  Pt denies chest pain, increasing sob or doe, wheezing, orthopnea, PND, increased LE swelling, palpitations, dizziness or syncope.  Pt denies new neurological symptoms such as new headache, or facial or extremity weakness or numbness.  Pt denies polydipsia, polyuria, or low sugar episode.   Pt denies new neurological symptoms such as new headache, or facial or extremity weakness or numbness.   Pt states overall good compliance with meds, mostly trying to follow DM low chl diet, with wt overall stable,  but little exercise however.  Wt Readings from Last 3 Encounters:  07/17/16 192 lb (87.1 kg)  01/23/16 194 lb (88 kg)  09/22/15 188 lb 4.8 oz (85.4 kg)  No other changes to basic hx Past Medical History:  Diagnosis Date  . Arthritis    right shoulder  . Bladder neck obstruction 04/18/2011  . CKD (chronic kidney disease), stage III 09/12/2015  . ERECTILE DYSFUNCTION 11/15/2008  . Essential hypertension, benign 04/28/2013  . FATIGUE 11/15/2008  . GERD 11/15/2008  . History of adenomatous polyp of colon 05/06/2014   2010  . HYPERLIPIDEMIA 11/15/2008  . Impaired glucose tolerance 04/18/2011  . LEG PAIN, LEFT 11/05/2010  . LVH (left ventricular hypertrophy) 04/27/2012  . MIGRAINE, COMMON 08/06/2010  . Nocturia 12/19/2008  . Polycythemia 04/27/2012  . Swelling of limb 11/05/2010  . THROMBOCYTOSIS 12/19/2008   myleoproliferative disorder   Past Surgical History:  Procedure Laterality Date  . APPENDECTOMY    . CARDIAC CATHETERIZATION N/A 09/05/2015   Procedure: Left Heart Cath and Coronary Angiography;  Surgeon: Troy Sine, MD;  Location: [facility];  Service: Cardiovascular;  Laterality: N/A;  . CIRCUMCISION     30+yrs ago  . COLONOSCOPY    . POLYPECTOMY    . s/p esophageal dilation    . TONSILLECTOMY    . UPPER GASTROINTESTINAL ENDOSCOPY      reports  that he quit smoking about 45 years ago. His smoking use included Cigarettes. He has a 6.00 pack-year smoking history. He has never used smokeless tobacco. He reports that he drinks alcohol. He reports that he does not use drugs. family history includes Cancer in his father; Hypertension in his brother, mother, and sister; Prostate cancer in his father. No Known Allergies Current Outpatient Prescriptions on File Prior to Visit  Medication Sig Dispense Refill  . amLODipine (NORVASC) 5 MG tablet TAKE 1 TABLET (5 MG TOTAL) BY MOUTH DAILY. 30 tablet 5  . aspirin 81 MG tablet Take 81 mg by mouth daily.      . carvedilol (COREG) 12.5 MG tablet Take 1 tablet (12.5 mg total) by mouth 2 (two) times daily with a meal. 60 tablet 5  . furosemide (LASIX) 20 MG tablet Take 1 tablet (20 mg total) by mouth daily. 30 tablet 1  . guaiFENesin (MUCINEX) 600 MG 12 hr tablet Take 1 tablet (600 mg total) by mouth 2 (two) times daily. 30 tablet 0  . hydroxyurea (HYDREA) 500 MG capsule TAKE 1 CAPSULE BY MOUTH TWO TIMES DAILY. TAKE WITH FOOD TO MINIMIZE GI SIDE EFFECTS 90 capsule 3  . lisinopril (PRINIVIL,ZESTRIL) 5 MG tablet Take 1 tablet (5 mg total) by mouth daily. 30 tablet 5  . lovastatin (MEVACOR) 40 MG tablet TAKE 1 TABLET (40 MG TOTAL) BY MOUTH DAILY. 90 tablet 0  . pantoprazole (PROTONIX)  40 MG tablet Take 1 tablet (40 mg total) by mouth daily. 30 tablet 0  . tamsulosin (FLOMAX) 0.4 MG CAPS capsule TAKE 1 CAPSULE (0.4 MG TOTAL) BY MOUTH DAILY. 90 capsule 0   No current facility-administered medications on file prior to visit.    Review of Systems  Constitutional: Negative for unusual diaphoresis or night sweats HENT: Negative for ear swelling or discharge Eyes: Negative for worsening visual haziness  Respiratory: Negative for choking and stridor.   Gastrointestinal: Negative for distension or worsening eructation Genitourinary: Negative for retention or change in urine volume.  Musculoskeletal: Negative for  other MSK pain or swelling Skin: Negative for color change and worsening wound Neurological: Negative for tremors and numbness other than noted  Psychiatric/Behavioral: Negative for decreased concentration or agitation other than above   All other system neg per pt    Objective:   Physical Exam BP 130/80   Pulse 88   Temp 98.5 F (36.9 C) (Oral)   Resp 20   Wt 192 lb (87.1 kg)   SpO2 95%   BMI 28.35 kg/m  VS noted, not ill appearing Constitutional: Pt appears in no apparent distress HENT: Head: NCAT.  Right Ear: External ear normal.  Left Ear: External ear normal.  Eyes: . Pupils are equal, round, and reactive to light. Conjunctivae and EOM are normal Neck: Normal range of motion. Neck supple.  Cardiovascular: Normal rate and regular rhythm.   Pulmonary/Chest: Effort normal and breath sounds without rales or wheezing.  Neurological: Pt is alert. Not confused , motor grossly intact Skin: Skin is warm. No rash, no LE edema Psychiatric: Pt behavior is normal. No agitation.  No other significant exam changes    Assessment & Plan:

## 2016-07-17 NOTE — Progress Notes (Signed)
Pre visit review using our clinic review tool, if applicable. No additional management support is needed unless otherwise documented below in the visit note. 

## 2016-07-17 NOTE — Patient Instructions (Signed)
You had the Pneumovax pneumonia shot  Please continue all other medications as before, and refills have been done if requested.  Please have the pharmacy call with any other refills you may need.  Please continue your efforts at being more active, low cholesterol diet, and weight control.  Please keep your appointments with your specialists as you may have planned  Please return in 6 months, or sooner if needed, with Lab testing done 3-5 days before

## 2016-07-19 NOTE — Assessment & Plan Note (Signed)
stable overall by history and exam, recent data reviewed with pt, and pt to continue medical treatment as before,  to f/u any worsening symptoms or concerns Lab Results  Component Value Date   HGBA1C 6.0 01/23/2016    

## 2016-07-19 NOTE — Assessment & Plan Note (Signed)
stable overall by history and exam, recent data reviewed with pt, and pt to continue medical treatment as before,  to f/u any worsening symptoms or concerns BP Readings from Last 3 Encounters:  07/17/16 130/80  01/23/16 130/78  09/22/15 130/88

## 2016-07-19 NOTE — Assessment & Plan Note (Signed)
stable overall by history and exam, recent data reviewed with pt, and pt to continue medical treatment as before,  to f/u any worsening symptoms or concerns Lab Results  Component Value Date   CREATININE 1.25 01/23/2016

## 2016-07-19 NOTE — Assessment & Plan Note (Signed)
stable overall by history and exam, recent data reviewed with pt, and pt to continue medical treatment as before,  to f/u any worsening symptoms or concerns Lab Results  Component Value Date   LDLCALC 56 01/23/2016

## 2016-07-20 ENCOUNTER — Other Ambulatory Visit: Payer: Self-pay | Admitting: Internal Medicine

## 2016-10-04 ENCOUNTER — Other Ambulatory Visit: Payer: Self-pay | Admitting: Oncology

## 2017-02-05 ENCOUNTER — Ambulatory Visit (INDEPENDENT_AMBULATORY_CARE_PROVIDER_SITE_OTHER): Payer: 59 | Admitting: Internal Medicine

## 2017-02-05 ENCOUNTER — Encounter: Payer: Self-pay | Admitting: Internal Medicine

## 2017-02-05 ENCOUNTER — Other Ambulatory Visit: Payer: Self-pay | Admitting: Internal Medicine

## 2017-02-05 VITALS — BP 126/88 | HR 77 | Ht 69.0 in | Wt 204.0 lb

## 2017-02-05 DIAGNOSIS — I10 Essential (primary) hypertension: Secondary | ICD-10-CM

## 2017-02-05 DIAGNOSIS — M7989 Other specified soft tissue disorders: Secondary | ICD-10-CM | POA: Diagnosis not present

## 2017-02-05 DIAGNOSIS — R7302 Impaired glucose tolerance (oral): Secondary | ICD-10-CM

## 2017-02-05 MED ORDER — CEPHALEXIN 500 MG PO CAPS: 500.0000 mg | ORAL_CAPSULE | Freq: Three times a day (TID) | ORAL | 0 refills | Status: DC

## 2017-02-05 MED ORDER — RIVAROXABAN (XARELTO) VTE STARTER PACK (15 & 20 MG): ORAL_TABLET | ORAL | 0 refills | Status: DC

## 2017-02-05 NOTE — Assessment & Plan Note (Signed)
stable overall by history and exam, recent data reviewed with pt, and pt to continue medical treatment as before,  to f/u any worsening symptoms or concerns Lab Results  Component Value Date   HGBA1C 6.0 01/23/2016

## 2017-02-05 NOTE — Assessment & Plan Note (Signed)
Etiology unclear, have high suspcion for acute DVT given severity of swelling, but cant r/o cellulitis, though seems less likely without more pain or fever.  Pt declines presenting to ED tonight, for cephalexin course, also start xarelto starter pack, and for stat Venous doppler u/s in AM

## 2017-02-05 NOTE — Progress Notes (Signed)
Subjective:    Patient ID: Dillon [name], male    DOB: [birth year], 71 y.o.   MRN: [id number]  HPI  Here with c/o 1 wk onset large RLE swelling to upper thigh with surprisingly little discomfort except an area to post distal LLE with nondiscrete area erythema and mild tenderness at worst; no ulcer or drainage, no f/c, red streaks.  No recent trauma or rash.  Pt denies chest pain, increased sob or doe, wheezing, orthopnea, PND,  palpitations, dizziness or syncope.  Swelling seems better at night with legs up to sleep, but not completely and worse again the next day.  Pt denies polydipsia, polyuria     Past Medical History:  Diagnosis Date  . Arthritis    right shoulder  . Bladder neck obstruction 04/18/2011  . CKD (chronic kidney disease), stage III 09/12/2015  . ERECTILE DYSFUNCTION 11/15/2008  . Essential hypertension, benign 04/28/2013  . FATIGUE 11/15/2008  . GERD 11/15/2008  . History of adenomatous polyp of colon 05/06/2014   2010  . HYPERLIPIDEMIA 11/15/2008  . Impaired glucose tolerance 04/18/2011  . LEG PAIN, LEFT 11/05/2010  . LVH (left ventricular hypertrophy) 04/27/2012  . MIGRAINE, COMMON 08/06/2010  . Nocturia 12/19/2008  . Polycythemia 04/27/2012  . Swelling of limb 11/05/2010  . THROMBOCYTOSIS 12/19/2008   myleoproliferative disorder   Past Surgical History:  Procedure Laterality Date  . APPENDECTOMY    . CARDIAC CATHETERIZATION N/A 09/05/2015   Procedure: Left Heart Cath and Coronary Angiography;  Surgeon: Troy Sine, MD;  Location: [facility];  Service: Cardiovascular;  Laterality: N/A;  . CIRCUMCISION     30+yrs ago  . COLONOSCOPY    . POLYPECTOMY    . s/p esophageal dilation    . TONSILLECTOMY    . UPPER GASTROINTESTINAL ENDOSCOPY      reports that he quit smoking about 46 years ago. His smoking use included Cigarettes. He has a 6.00 pack-year smoking history. He has never used smokeless tobacco. He reports that he drinks alcohol. He reports that he does not  use drugs. family history includes Cancer in his father; Hypertension in his brother, mother, and sister; Prostate cancer in his father. No Known Allergies Current Outpatient Prescriptions on File Prior to Visit  Medication Sig Dispense Refill  . amLODipine (NORVASC) 5 MG tablet TAKE 1 TABLET BY MOUTH DAILY 30 tablet 5  . aspirin 81 MG tablet Take 81 mg by mouth daily.      . carvedilol (COREG) 12.5 MG tablet TAKE 1 TABLET (12.5 MG TOTAL) BY MOUTH 2 (TWO) TIMES DAILY WITH A MEAL. 60 tablet 5  . furosemide (LASIX) 20 MG tablet Take 1 tablet (20 mg total) by mouth daily. 30 tablet 1  . guaiFENesin (MUCINEX) 600 MG 12 hr tablet Take 1 tablet (600 mg total) by mouth 2 (two) times daily. 30 tablet 0  . hydroxyurea (HYDREA) 500 MG capsule TAKE 1 CAPSULE BY MOUTH TWO TIMES DAILY. TAKE WITH FOOD TO MINIMIZE GI SIDE EFFECTS 90 capsule 3  . lisinopril (PRINIVIL,ZESTRIL) 5 MG tablet TAKE 1 TABLET (5 MG TOTAL) BY MOUTH DAILY. 30 tablet 5  . lovastatin (MEVACOR) 40 MG tablet TAKE 1 TABLET (40 MG TOTAL) BY MOUTH DAILY. 90 tablet 0  . pantoprazole (PROTONIX) 40 MG tablet Take 1 tablet (40 mg total) by mouth daily. 30 tablet 0  . tadalafil (CIALIS) 20 MG tablet Take 1 tablet (20 mg total) by mouth daily as needed for erectile dysfunction. 40 tablet 11  .  tamsulosin (FLOMAX) 0.4 MG CAPS capsule TAKE 1 CAPSULE (0.4 MG TOTAL) BY MOUTH DAILY. 90 capsule 0   No current facility-administered medications on file prior to visit.    Review of Systems  Constitutional: Negative for other unusual diaphoresis or sweats HENT: Negative for ear discharge or swelling Eyes: Negative for other worsening visual disturbances Respiratory: Negative for stridor or other swelling  Gastrointestinal: Negative for worsening distension or other blood Genitourinary: Negative for retention or other urinary change Musculoskeletal: Negative for other MSK pain or swelling Skin: Negative for color change or other new lesions Neurological:  Negative for worsening tremors and other numbness  Psychiatric/Behavioral: Negative for worsening agitation or other fatigue All other system neg per pt    Objective:   Physical Exam BP 126/88   Pulse 77   Ht 5\' 9"  (1.753 m)   Wt 204 lb (92.5 kg)   SpO2 99%   BMI 30.13 kg/m  VS noted,  Constitutional: Pt appears in NAD HENT: Head: NCAT.  Right Ear: External ear normal.  Left Ear: External ear normal.  Eyes: . Pupils are equal, round, and reactive to light. Conjunctivae and EOM are normal Nose: without d/c or deformity Neck: Neck supple. Gross normal ROM Cardiovascular: Normal rate and regular rhythm.   Pulmonary/Chest: Effort normal and breath sounds without rales or wheezing.  Neurological: Pt is alert. At baseline orientation, motor grossly intact Skin: RLE with large 2-3+ edema to above the knee to mid thigh; also with vague non discrete area of surprisingly minimally tender 8 x 4 cm area erythema to distal RLE posteriorly just above the ankle; RLE o/w neurovasc intact Psychiatric: Pt behavior is normal without agitation  No other exam findings    Assessment & Plan:

## 2017-02-05 NOTE — Assessment & Plan Note (Signed)
stable overall by history and exam, recent data reviewed with pt, and pt to continue medical treatment as before,  to f/u any worsening symptoms or concerns ' BP Readings from Last 3 Encounters:  02/05/17 126/88  07/17/16 130/80  01/23/16 130/78

## 2017-02-05 NOTE — Patient Instructions (Signed)
Please take all new medication as prescribed - the antibiotic, and the blood thinner if not too expensive  You will be contacted regarding the referral for: Vein test for the right leg asap (tomorrow)  Please continue all other medications as before, and refills have been done if requested.  Please have the pharmacy call with any other refills you may need.  Please keep your appointments with your specialists as you may have planned

## 2017-02-11 ENCOUNTER — Ambulatory Visit (INDEPENDENT_AMBULATORY_CARE_PROVIDER_SITE_OTHER): Payer: 59 | Admitting: Internal Medicine

## 2017-02-11 VITALS — BP 128/84 | HR 72 | Ht 69.0 in | Wt 204.0 lb

## 2017-02-11 DIAGNOSIS — I10 Essential (primary) hypertension: Secondary | ICD-10-CM

## 2017-02-11 DIAGNOSIS — M7989 Other specified soft tissue disorders: Secondary | ICD-10-CM | POA: Diagnosis not present

## 2017-02-11 DIAGNOSIS — R7302 Impaired glucose tolerance (oral): Secondary | ICD-10-CM

## 2017-02-11 MED ORDER — TRAMADOL HCL 50 MG PO TABS: 50.0000 mg | ORAL_TABLET | Freq: Four times a day (QID) | ORAL | 0 refills | Status: DC | PRN

## 2017-02-11 MED ORDER — SULFAMETHOXAZOLE-TRIMETHOPRIM 800-160 MG PO TABS: 1.0000 | ORAL_TABLET | Freq: Two times a day (BID) | ORAL | 0 refills | Status: DC

## 2017-02-11 NOTE — Assessment & Plan Note (Signed)
Very surprising for this worsening presentation that he still does not feel overly ill or having f/c; has declines venous dopplers, erythema much worse in size of area and intensity with mild to mod tender, but without abscess or ulcer; has failed cephalexin as has taken this with good compliance; ok for change to septra ds but if any worsening in the next even 1-2 days, should present to ED for failed outpt tx

## 2017-02-11 NOTE — Assessment & Plan Note (Signed)
stable overall by history and exam, recent data reviewed with pt, and pt to continue medical treatment as before,  to f/u any worsening symptoms or concerns BP Readings from Last 3 Encounters:  02/11/17 128/84  02/05/17 126/88  07/17/16 130/80

## 2017-02-11 NOTE — Progress Notes (Signed)
Subjective:    Patient ID: Dillon [name], male    DOB: [birth year], 71 y.o.   MRN: [id number]  HPI  Here to f/u right leg pain and swelling; denies f/c but has markedly worsening redness/swelling/pain mostly to post leg above the ankle.  Has some mild LE edema as well but Pt denies chest pain, increased sob or doe, wheezing, orthopnea, PND, palpitations, dizziness or syncope. Has not had the venous doppler testing done and declines this.   Pt denies polydipsia, polyuria Past Medical History:  Diagnosis Date  . Arthritis    right shoulder  . Bladder neck obstruction 04/18/2011  . CKD (chronic kidney disease), stage III 09/12/2015  . ERECTILE DYSFUNCTION 11/15/2008  . Essential hypertension, benign 04/28/2013  . FATIGUE 11/15/2008  . GERD 11/15/2008  . History of adenomatous polyp of colon 05/06/2014   2010  . HYPERLIPIDEMIA 11/15/2008  . Impaired glucose tolerance 04/18/2011  . LEG PAIN, LEFT 11/05/2010  . LVH (left ventricular hypertrophy) 04/27/2012  . MIGRAINE, COMMON 08/06/2010  . Nocturia 12/19/2008  . Polycythemia 04/27/2012  . Swelling of limb 11/05/2010  . THROMBOCYTOSIS 12/19/2008   myleoproliferative disorder   Past Surgical History:  Procedure Laterality Date  . APPENDECTOMY    . CARDIAC CATHETERIZATION N/A 09/05/2015   Procedure: Left Heart Cath and Coronary Angiography;  Surgeon: Troy Sine, MD;  Location: [facility];  Service: Cardiovascular;  Laterality: N/A;  . CIRCUMCISION     30+yrs ago  . COLONOSCOPY    . POLYPECTOMY    . s/p esophageal dilation    . TONSILLECTOMY    . UPPER GASTROINTESTINAL ENDOSCOPY      reports that he quit smoking about 46 years ago. His smoking use included Cigarettes. He has a 6.00 pack-year smoking history. He has never used smokeless tobacco. He reports that he drinks alcohol. He reports that he does not use drugs. family history includes Cancer in his father; Hypertension in his brother, mother, and sister; Prostate cancer in his  father. No Known Allergies Current Outpatient Prescriptions on File Prior to Visit  Medication Sig Dispense Refill  . amLODipine (NORVASC) 5 MG tablet TAKE 1 TABLET BY MOUTH DAILY 30 tablet 5  . aspirin 81 MG tablet Take 81 mg by mouth daily.      . carvedilol (COREG) 12.5 MG tablet TAKE 1 TABLET (12.5 MG TOTAL) BY MOUTH 2 (TWO) TIMES DAILY WITH A MEAL. 60 tablet 5  . furosemide (LASIX) 20 MG tablet Take 1 tablet (20 mg total) by mouth daily. 30 tablet 1  . guaiFENesin (MUCINEX) 600 MG 12 hr tablet Take 1 tablet (600 mg total) by mouth 2 (two) times daily. 30 tablet 0  . hydroxyurea (HYDREA) 500 MG capsule TAKE 1 CAPSULE BY MOUTH TWO TIMES DAILY. TAKE WITH FOOD TO MINIMIZE GI SIDE EFFECTS 90 capsule 3  . lisinopril (PRINIVIL,ZESTRIL) 5 MG tablet TAKE 1 TABLET (5 MG TOTAL) BY MOUTH DAILY. 30 tablet 5  . lovastatin (MEVACOR) 40 MG tablet TAKE 1 TABLET (40 MG TOTAL) BY MOUTH DAILY. 90 tablet 0  . pantoprazole (PROTONIX) 40 MG tablet Take 1 tablet (40 mg total) by mouth daily. 30 tablet 0  . Rivaroxaban (XARELTO STARTER PACK) 15 & 20 MG TBPK Take as directed on package: Start with one 15mg  tablet by mouth twice a day with food. On Day 22, switch to one 20mg  tablet once a day with food. 51 each 0  . tadalafil (CIALIS) 20 MG tablet Take 1 tablet (  20 mg total) by mouth daily as needed for erectile dysfunction. 40 tablet 11  . tamsulosin (FLOMAX) 0.4 MG CAPS capsule TAKE 1 CAPSULE (0.4 MG TOTAL) BY MOUTH DAILY. 90 capsule 0   No current facility-administered medications on file prior to visit.    Review of Systems  Constitutional: Negative for other unusual diaphoresis or sweats HENT: Negative for ear discharge or swelling Eyes: Negative for other worsening visual disturbances Respiratory: Negative for stridor or other swelling  Gastrointestinal: Negative for worsening distension or other blood Genitourinary: Negative for retention or other urinary change Musculoskeletal: Negative for other MSK  pain or swelling Skin: Negative for color change or other new lesions Neurological: Negative for worsening tremors and other numbness  Psychiatric/Behavioral: Negative for worsening agitation or other fatigue All other system neg per pt    Objective:   Physical Exam BP 128/84   Pulse 72   Ht 5\' 9"  (1.753 m)   Wt 204 lb (92.5 kg)   SpO2 98%   BMI 30.13 kg/m  VS noted,  Constitutional: Pt appears in NAD HENT: Head: NCAT.  Right Ear: External ear normal.  Left Ear: External ear normal.  Eyes: . Pupils are equal, round, and reactive to light. Conjunctivae and EOM are normal Nose: without d/c or deformity Neck: Neck supple. Gross normal ROM Cardiovascular: Normal rate and regular rhythm.   Pulmonary/Chest: Effort normal and breath sounds without rales or wheezing.  Abd:  Soft, NT, ND, + BS, no organomegaly Neurological: Pt is alert. At baseline orientation, motor grossly intact Skin: Skin is warm/tender/burnished red to entire post LLE from above the ankle to just below ghe knee by 1-2 cm, assoc with 2-3+ RLE edema and trace LLE edema as well; without abscesds Psychiatric: Pt behavior is normal without agitation  No other exam findings    Assessment & Plan:

## 2017-02-11 NOTE — Assessment & Plan Note (Signed)
stable overall by history and exam, recent data reviewed with pt, and pt to continue medical treatment as before,  to f/u any worsening symptoms or concerns Lab Results  Component Value Date   HGBA1C 6.0 01/23/2016

## 2017-02-11 NOTE — Patient Instructions (Signed)
OK to stop the cephalexin  Please take all new medication as prescribed - the septra DS antibiotic  Please continue all other medications as before, and refills have been done if requested.  Please have the pharmacy call with any other refills you may need.  Please keep your appointments with your specialists as you may have planned  Please go the ER in 1-2 days if you are not improving, as you may need I antibiotics

## 2017-02-14 ENCOUNTER — Emergency Department (HOSPITAL_COMMUNITY): Payer: 59

## 2017-02-14 ENCOUNTER — Inpatient Hospital Stay (HOSPITAL_COMMUNITY)
Admission: EM | Admit: 2017-02-14 | Discharge: 2017-02-17 | DRG: 872 | Disposition: A | Discharge status: Home / Self Care | Payer: 59 | Attending: Family Medicine | Admitting: Family Medicine

## 2017-02-14 ENCOUNTER — Encounter (HOSPITAL_COMMUNITY): Payer: Self-pay

## 2017-02-14 ENCOUNTER — Other Ambulatory Visit: Payer: Self-pay

## 2017-02-14 DIAGNOSIS — K219 Gastro-esophageal reflux disease without esophagitis: Secondary | ICD-10-CM | POA: Diagnosis present

## 2017-02-14 DIAGNOSIS — L03119 Cellulitis of unspecified part of limb: Secondary | ICD-10-CM

## 2017-02-14 DIAGNOSIS — Z7901 Long term (current) use of anticoagulants: Secondary | ICD-10-CM | POA: Diagnosis not present

## 2017-02-14 DIAGNOSIS — I5022 Chronic systolic (congestive) heart failure: Secondary | ICD-10-CM | POA: Diagnosis present

## 2017-02-14 DIAGNOSIS — Z8249 Family history of ischemic heart disease and other diseases of the circulatory system: Secondary | ICD-10-CM

## 2017-02-14 DIAGNOSIS — Z8042 Family history of malignant neoplasm of prostate: Secondary | ICD-10-CM | POA: Diagnosis not present

## 2017-02-14 DIAGNOSIS — I130 Hypertensive heart and chronic kidney disease with heart failure and stage 1 through stage 4 chronic kidney disease, or unspecified chronic kidney disease: Secondary | ICD-10-CM | POA: Diagnosis present

## 2017-02-14 DIAGNOSIS — L03115 Cellulitis of right lower limb: Secondary | ICD-10-CM | POA: Diagnosis present

## 2017-02-14 DIAGNOSIS — I428 Other cardiomyopathies: Secondary | ICD-10-CM

## 2017-02-14 DIAGNOSIS — Z7982 Long term (current) use of aspirin: Secondary | ICD-10-CM

## 2017-02-14 DIAGNOSIS — D751 Secondary polycythemia: Secondary | ICD-10-CM | POA: Diagnosis present

## 2017-02-14 DIAGNOSIS — A419 Sepsis, unspecified organism: Principal | ICD-10-CM | POA: Diagnosis present

## 2017-02-14 DIAGNOSIS — M7989 Other specified soft tissue disorders: Secondary | ICD-10-CM | POA: Diagnosis not present

## 2017-02-14 DIAGNOSIS — I10 Essential (primary) hypertension: Secondary | ICD-10-CM | POA: Diagnosis present

## 2017-02-14 DIAGNOSIS — R509 Fever, unspecified: Secondary | ICD-10-CM

## 2017-02-14 DIAGNOSIS — D45 Polycythemia vera: Secondary | ICD-10-CM | POA: Diagnosis not present

## 2017-02-14 DIAGNOSIS — N183 Chronic kidney disease, stage 3 unspecified: Secondary | ICD-10-CM | POA: Diagnosis present

## 2017-02-14 DIAGNOSIS — Z79899 Other long term (current) drug therapy: Secondary | ICD-10-CM

## 2017-02-14 DIAGNOSIS — E785 Hyperlipidemia, unspecified: Secondary | ICD-10-CM | POA: Diagnosis present

## 2017-02-14 DIAGNOSIS — L02419 Cutaneous abscess of limb, unspecified: Secondary | ICD-10-CM | POA: Diagnosis not present

## 2017-02-14 DIAGNOSIS — N179 Acute kidney failure, unspecified: Secondary | ICD-10-CM | POA: Diagnosis present

## 2017-02-14 DIAGNOSIS — J9811 Atelectasis: Secondary | ICD-10-CM | POA: Diagnosis not present

## 2017-02-14 DIAGNOSIS — I429 Cardiomyopathy, unspecified: Secondary | ICD-10-CM | POA: Diagnosis not present

## 2017-02-14 LAB — COMPREHENSIVE METABOLIC PANEL
ALBUMIN: 4 g/dL (ref 3.5–5.0)
ALT: 19 U/L (ref 17–63)
AST: 23 U/L (ref 15–41)
Alkaline Phosphatase: 87 U/L (ref 38–126)
Anion gap: 10 (ref 5–15)
BILIRUBIN TOTAL: 1.6 mg/dL — AB (ref 0.3–1.2)
BUN: 21 mg/dL — AB (ref 6–20)
CHLORIDE: 102 mmol/L (ref 101–111)
CO2: 21 mmol/L — ABNORMAL LOW (ref 22–32)
CREATININE: 1.65 mg/dL — AB (ref 0.61–1.24)
Calcium: 9.4 mg/dL (ref 8.9–10.3)
GFR calc Af Amer: 46 mL/min — ABNORMAL LOW (ref >60)
GFR, EST NON AFRICAN AMERICAN: 40 mL/min — AB (ref >60)
GLUCOSE: 151 mg/dL — AB (ref 65–99)
Potassium: 4.3 mmol/L (ref 3.5–5.1)
Sodium: 133 mmol/L — ABNORMAL LOW (ref 135–145)
Total Protein: 7.4 g/dL (ref 6.5–8.1)

## 2017-02-14 LAB — CBC WITH DIFFERENTIAL/PLATELET
Basophils Absolute: 0 10*3/uL (ref 0.0–0.1)
Basophils Relative: 0 %
EOS ABS: 0.1 10*3/uL (ref 0.0–0.7)
EOS PCT: 1 %
HCT: 59.6 % — ABNORMAL HIGH (ref 39.0–52.0)
Hemoglobin: 19.4 g/dL — ABNORMAL HIGH (ref 13.0–17.0)
LYMPHS ABS: 0.9 10*3/uL (ref 0.7–4.0)
LYMPHS PCT: 6 %
MCH: 30.5 pg (ref 26.0–34.0)
MCHC: 32.6 g/dL (ref 30.0–36.0)
MCV: 93.6 fL (ref 78.0–100.0)
MONO ABS: 0.9 10*3/uL (ref 0.1–1.0)
Monocytes Relative: 6 %
Neutro Abs: 13.5 10*3/uL — ABNORMAL HIGH (ref 1.7–7.7)
Neutrophils Relative %: 88 %
PLATELETS: 580 10*3/uL — AB (ref 150–400)
RBC: 6.37 MIL/uL — ABNORMAL HIGH (ref 4.22–5.81)
RDW: 14.5 % (ref 11.5–15.5)
WBC: 15.4 10*3/uL — ABNORMAL HIGH (ref 4.0–10.5)

## 2017-02-14 LAB — I-STAT CG4 LACTIC ACID, ED
LACTIC ACID, VENOUS: 2.04 mmol/L — AB (ref 0.5–1.9)
LACTIC ACID, VENOUS: 1.23 mmol/L (ref 0.5–1.9)

## 2017-02-14 LAB — BRAIN NATRIURETIC PEPTIDE: B Natriuretic Peptide: 55.7 pg/mL (ref 0.0–100.0)

## 2017-02-14 IMAGING — CR DG CHEST 2V
2 series · 2 of 2 positions shown · non-contrast
Comparison: 08/31/2015

CLINICAL DATA: Ankle, knee swelling

EXAM:
CHEST  2 VIEW

[chest pa]
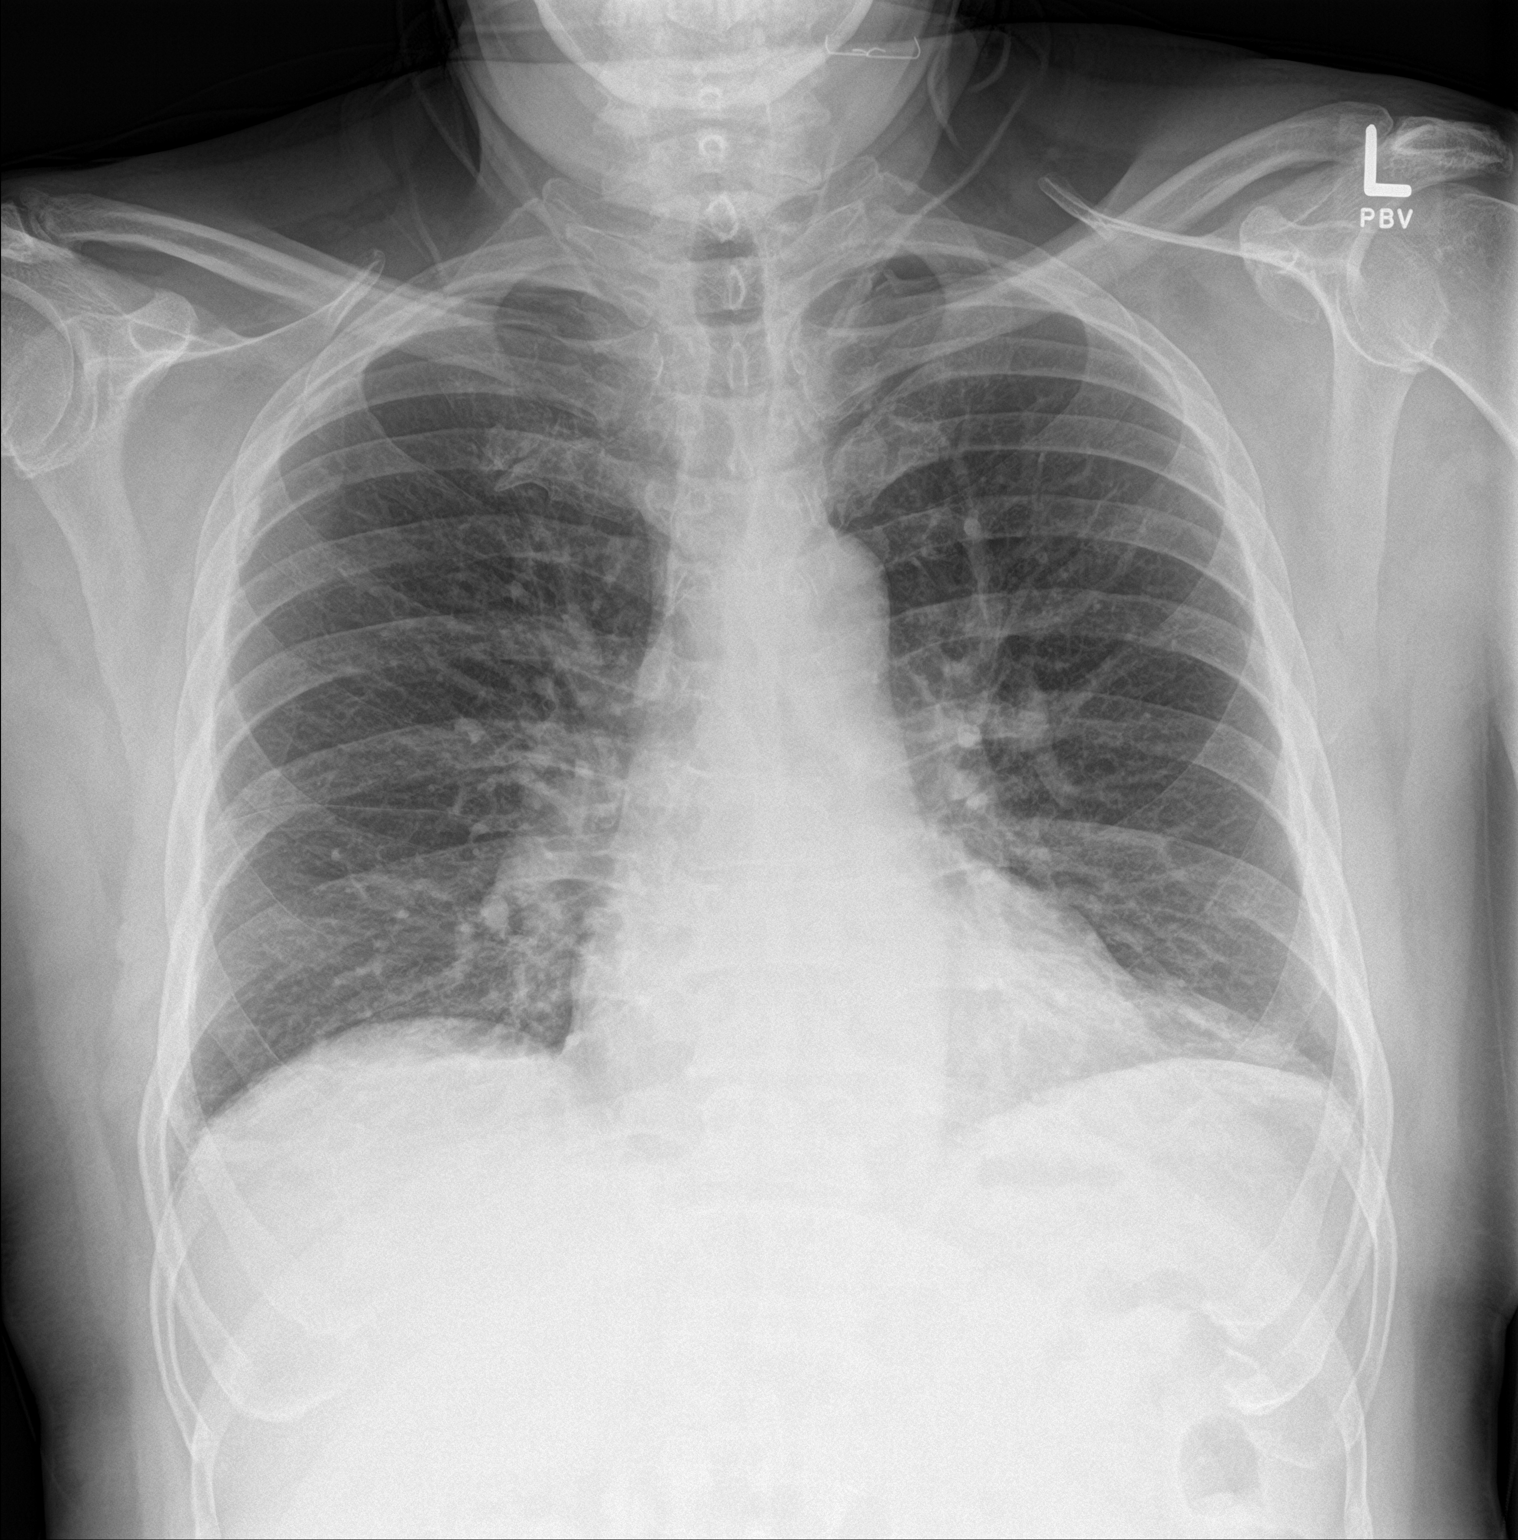

[chest lat]
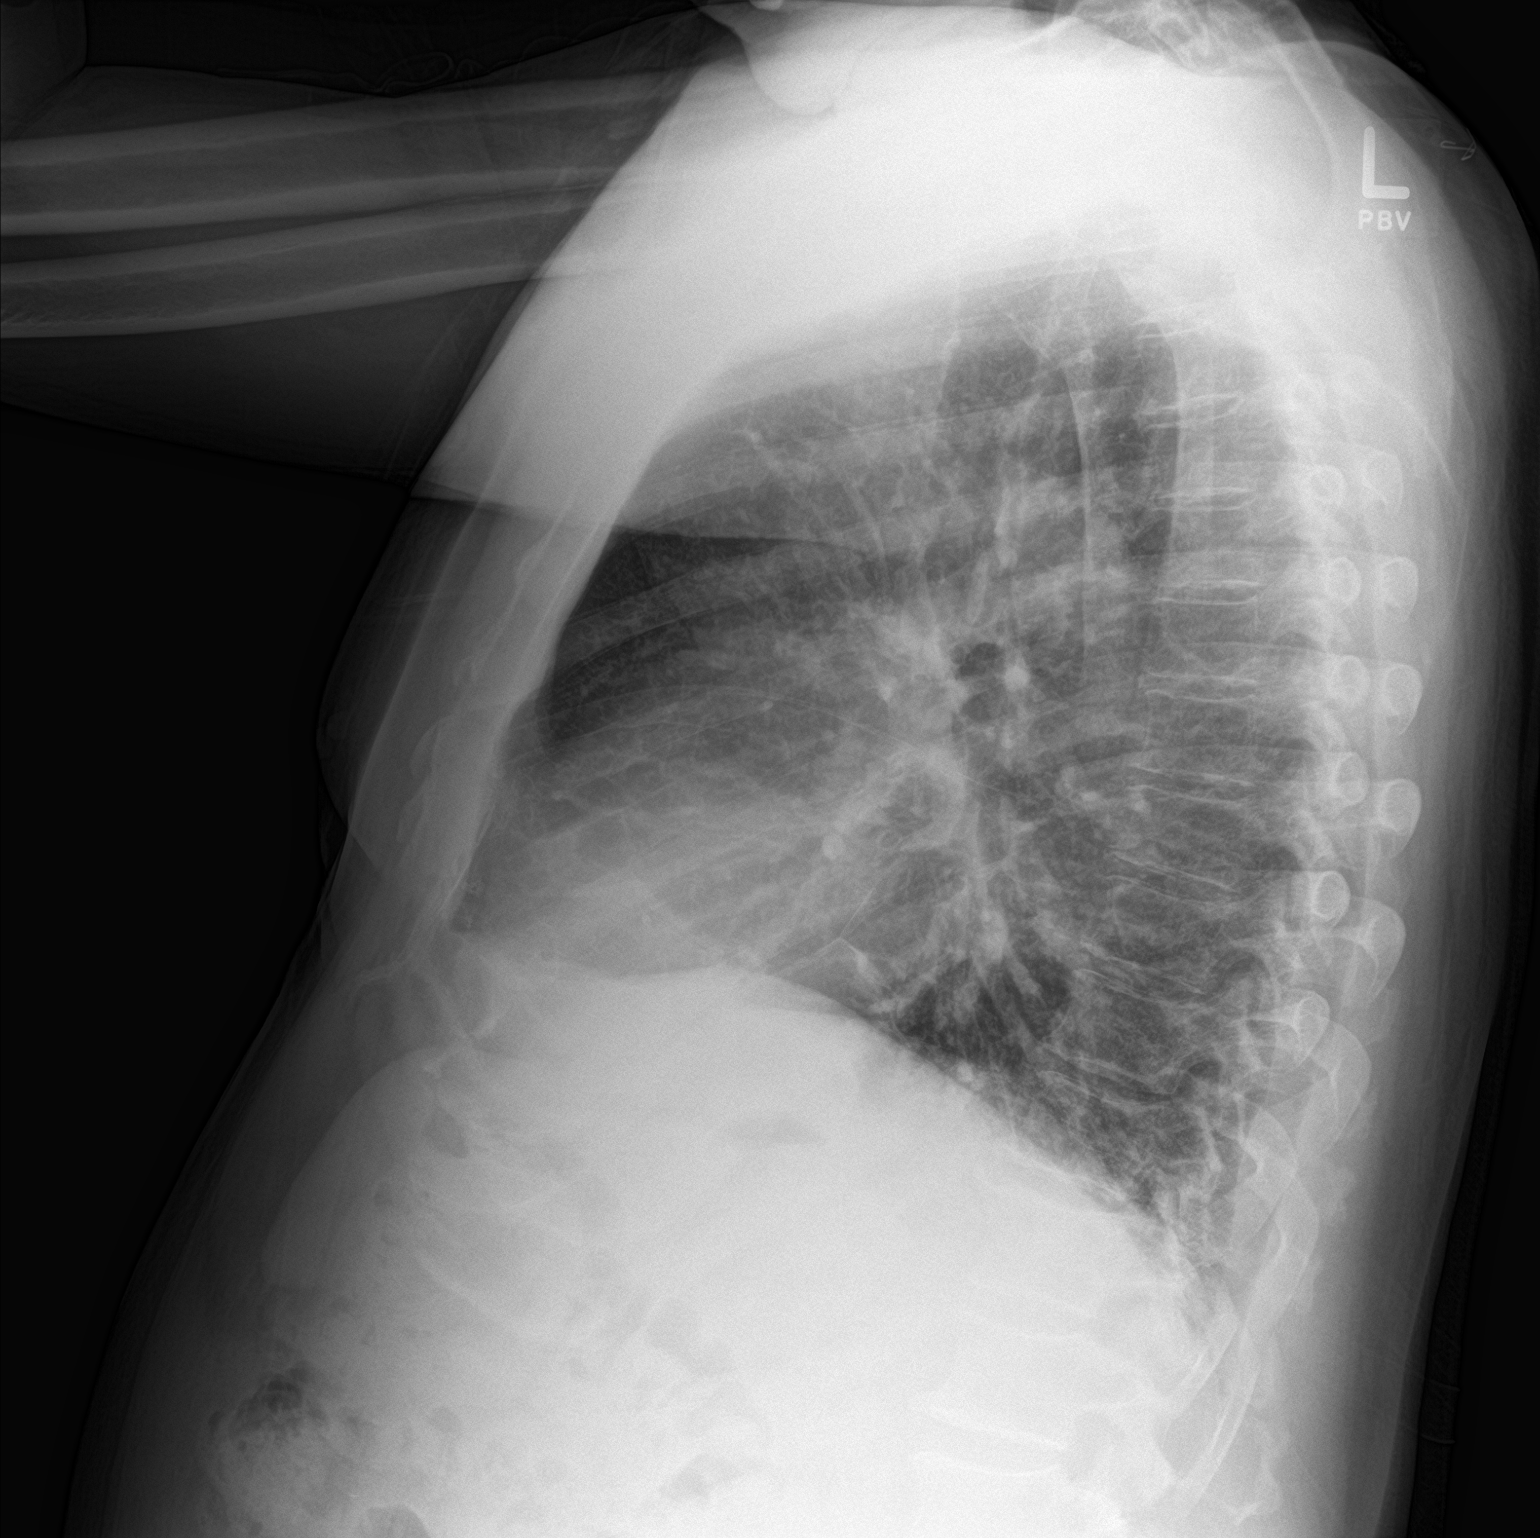

[2 of 2 positions shown; findings below may reference images not displayed]

FINDINGS: Linear atelectasis or scarring at the left lung base. No pleural
effusion. Cardiomediastinal silhouette within normal limits. No
pneumothorax.
IMPRESSION: Linear atelectasis or scarring at the left lung base.

## 2017-02-14 MED ORDER — RIVAROXABAN 15 MG PO TABS
15.0000 mg | ORAL_TABLET | Freq: Two times a day (BID) | ORAL | Status: DC
  Administered 2017-02-15 – 2017-02-16 (×4): 15 mg via ORAL
  Filled 2017-02-14 (×6): qty 1

## 2017-02-14 MED ORDER — RIVAROXABAN 20 MG PO TABS: 20.0000 mg | ORAL_TABLET | Freq: Every day | ORAL | Status: DC

## 2017-02-14 MED ORDER — PIPERACILLIN-TAZOBACTAM 3.375 G IVPB
3.3750 g | Freq: Three times a day (TID) | INTRAVENOUS | Status: DC
  Administered 2017-02-15 – 2017-02-17 (×7): 3.375 g via INTRAVENOUS
  Filled 2017-02-14 (×8): qty 50

## 2017-02-14 MED ORDER — PIPERACILLIN-TAZOBACTAM 3.375 G IVPB 30 MIN
3.3750 g | Freq: Once | INTRAVENOUS | Status: AC
Start: 2017-02-14 — End: 2017-02-14
  Administered 2017-02-14: 3.375 g via INTRAVENOUS
  Filled 2017-02-14: qty 50

## 2017-02-14 MED ORDER — HYDROXYUREA 500 MG PO CAPS
500.0000 mg | ORAL_CAPSULE | Freq: Two times a day (BID) | ORAL | Status: DC
  Administered 2017-02-15 – 2017-02-17 (×5): 500 mg via ORAL
  Filled 2017-02-14 (×6): qty 1

## 2017-02-14 MED ORDER — RIVAROXABAN (XARELTO) VTE STARTER PACK (15 & 20 MG): 15.0000 mg | ORAL_TABLET | Freq: Two times a day (BID) | ORAL | Status: DC

## 2017-02-14 MED ORDER — ACETAMINOPHEN 325 MG PO TABS
ORAL_TABLET | ORAL | Status: AC
  Filled 2017-02-14: qty 2

## 2017-02-14 MED ORDER — VANCOMYCIN HCL IN DEXTROSE 1-5 GM/200ML-% IV SOLN
1000.0000 mg | INTRAVENOUS | Status: DC
  Administered 2017-02-15 – 2017-02-16 (×2): 1000 mg via INTRAVENOUS
  Filled 2017-02-14 (×2): qty 200

## 2017-02-14 MED ORDER — ACETAMINOPHEN 325 MG PO TABS
650.0000 mg | ORAL_TABLET | Freq: Four times a day (QID) | ORAL | Status: DC | PRN
  Administered 2017-02-15: 650 mg via ORAL
  Filled 2017-02-14: qty 2

## 2017-02-14 MED ORDER — ACETAMINOPHEN 325 MG PO TABS
650.0000 mg | ORAL_TABLET | Freq: Once | ORAL | Status: AC
  Administered 2017-02-14: 650 mg via ORAL

## 2017-02-14 MED ORDER — CARVEDILOL 12.5 MG PO TABS
12.5000 mg | ORAL_TABLET | Freq: Two times a day (BID) | ORAL | Status: DC
Start: 2017-02-15 — End: 2017-02-17
  Administered 2017-02-15 – 2017-02-17 (×5): 12.5 mg via ORAL
  Filled 2017-02-14 (×5): qty 1

## 2017-02-14 MED ORDER — VANCOMYCIN HCL IN DEXTROSE 1-5 GM/200ML-% IV SOLN
1000.0000 mg | Freq: Once | INTRAVENOUS | Status: AC
  Administered 2017-02-14: 1000 mg via INTRAVENOUS
  Filled 2017-02-14: qty 200

## 2017-02-14 NOTE — Progress Notes (Signed)
Pharmacy Antibiotic Note  Dillon [name] is a 71 y.o. male admitted on 02/14/2017 with cellulitis.  Pharmacy has been consulted for vancomycin and Zosyn dosing.  One time doses of vancomycin 1g and Zosyn 3.375g ordered by EDP. SCr 1.65- appears to be elevated from baseline (~1.2 during 2017)  Plan: Vancomycin 1g IV q24h Zosyn 3.375g IV q8h EI Follow c/s, clinical progression, renal function, level at steady state     Temp (24hrs), Avg:102 F (38.9 C), Min:102 F (38.9 C), Max:102 F (38.9 C)   Recent Labs Lab 02/14/17 1833 02/14/17 1857  WBC 15.4*  --   CREATININE 1.65*  --   LATICACIDVEN  --  2.04*    Estimated Creatinine Clearance: 44.8 mL/min (A) (by C-G formula based on SCr of 1.65 mg/dL (H)).    No Known Allergies  Antimicrobials this admission: vancomycin 6/1 >>  Zosyn 6/1 >>   Dose adjustments this admission: n/a  Microbiology results: 6/1 BCx:   Thank you for allowing pharmacy to be a part of this patient's care.  Judit Awad D. Irwin, PharmD, [facility] Clinical Pharmacist [phone] 02/14/2017 9:04 PM

## 2017-02-14 NOTE — ED Notes (Signed)
Dorthula Matas, [phone] cell or [phone] home

## 2017-02-14 NOTE — Progress Notes (Signed)
Called ER RN for report. Room ready.  

## 2017-02-14 NOTE — ED Triage Notes (Signed)
Pt complaining of bilateral ankle swelling. Pt states onset x 1 week ago. Pt states increased weight gain past week. Pt denies any chest pain or SOB.

## 2017-02-14 NOTE — ED Notes (Signed)
Admitting provider at bedside.

## 2017-02-14 NOTE — ED Triage Notes (Signed)
Pt with redness and swelling to back or R lower leg.

## 2017-02-14 NOTE — H&P (Addendum)
History and Physical    Dillon [name] RJJ:[id number] DOB: 11/28/1945 DOA: 02/14/2017  PCP: Biagio Borg, MD  Patient coming from: Home  I have personally briefly reviewed patient's old medical records in [place]  Chief Complaint: Cellulitis  HPI: Dillon [name] is a 71 y.o. male with medical history significant of NICM with EF 15-20% on last echo in 2016, normal coronaries on cath at that time, no follow up echo (looks like he may have been lost to follow up); Jack2 positive polycythemia on hydrea, CKD stage 3.  Patient presents to the ED for R lower leg swelling and erythema.  Symptoms onset about 10 days ago.  Went to PCP started on Keflex, xarelto, and was advised to get doppler of leg (which he didn't do).  Went back to PCP 5/29, prescribed bactrim.  Symptoms continued to worsen despite treatment.  No fevers, chills, severe pain.  No CP, no SOB.   ED Course: Tm 102, WBC 15.4k, lactate 2.0, BNP 55.   Review of Systems: As per HPI otherwise 10 point review of systems negative.   Past Medical History:  Diagnosis Date  . Arthritis    right shoulder  . Bladder neck obstruction 04/18/2011  . CKD (chronic kidney disease), stage III 09/12/2015  . ERECTILE DYSFUNCTION 11/15/2008  . Essential hypertension, benign 04/28/2013  . FATIGUE 11/15/2008  . GERD 11/15/2008  . History of adenomatous polyp of colon 05/06/2014   2010  . HYPERLIPIDEMIA 11/15/2008  . Impaired glucose tolerance 04/18/2011  . LEG PAIN, LEFT 11/05/2010  . LVH (left ventricular hypertrophy) 04/27/2012  . MIGRAINE, COMMON 08/06/2010  . Nocturia 12/19/2008  . Polycythemia 04/27/2012  . Swelling of limb 11/05/2010  . THROMBOCYTOSIS 12/19/2008   myleoproliferative disorder    Past Surgical History:  Procedure Laterality Date  . APPENDECTOMY    . CARDIAC CATHETERIZATION N/A 09/05/2015   Procedure: Left Heart Cath and Coronary Angiography;  Surgeon: Troy Sine, MD;  Location: [facility];  Service:  Cardiovascular;  Laterality: N/A;  . CIRCUMCISION     30+yrs ago  . COLONOSCOPY    . POLYPECTOMY    . s/p esophageal dilation    . TONSILLECTOMY    . UPPER GASTROINTESTINAL ENDOSCOPY       reports that he quit smoking about 46 years ago. His smoking use included Cigarettes. He has a 6.00 pack-year smoking history. He has never used smokeless tobacco. He reports that he drinks alcohol. He reports that he does not use drugs.  No Known Allergies  Family History  Problem Relation Age of Onset  . Hypertension Mother   . Cancer Father        prostate cancer  . Prostate cancer Father   . Hypertension Sister   . Hypertension Brother   . Colon cancer Neg Hx   . Rectal cancer Neg Hx   . Stomach cancer Neg Hx   . Esophageal cancer Neg Hx   . Pancreatic cancer Neg Hx      Prior to Admission medications   Medication Sig Start Date End Date Taking? Authorizing Provider  carvedilol (COREG) 12.5 MG tablet TAKE 1 TABLET (12.5 MG TOTAL) BY MOUTH 2 (TWO) TIMES DAILY WITH A MEAL. 07/22/16  Yes Biagio Borg, MD  cephALEXin (KEFLEX) 500 MG capsule Take 500 mg by mouth 2 (two) times daily.   Yes [provider]  hydroxyurea (HYDREA) 500 MG capsule TAKE 1 CAPSULE BY MOUTH TWO TIMES DAILY. TAKE WITH FOOD  TO MINIMIZE GI SIDE EFFECTS 10/04/16  Yes Shadad, Mathis Dad, MD  Rivaroxaban (XARELTO STARTER PACK) 15 & 20 MG TBPK Take as directed on package: Start with one 15mg  tablet by mouth twice a day with food. On Day 22, switch to one 20mg  tablet once a day with food. 02/05/17  Yes Biagio Borg, MD  sulfamethoxazole-trimethoprim (BACTRIM DS,SEPTRA DS) 800-160 MG tablet Take 1 tablet by mouth 2 (two) times daily. 02/11/17  Yes Biagio Borg, MD  tamsulosin (FLOMAX) 0.4 MG CAPS capsule TAKE 1 CAPSULE (0.4 MG TOTAL) BY MOUTH DAILY. 04/08/16  Yes Biagio Borg, MD  traMADol (ULTRAM) 50 MG tablet Take 1 tablet (50 mg total) by mouth every 6 (six) hours as needed. 02/11/17  Yes Biagio Borg, MD  amLODipine  (NORVASC) 5 MG tablet TAKE 1 TABLET BY MOUTH DAILY Patient not taking: Reported on 02/14/2017 07/22/16   Biagio Borg, MD  furosemide (LASIX) 20 MG tablet Take 1 tablet (20 mg total) by mouth daily. Patient not taking: Reported on 02/14/2017 09/06/15   Reyne Dumas, MD  guaiFENesin (MUCINEX) 600 MG 12 hr tablet Take 1 tablet (600 mg total) by mouth 2 (two) times daily. Patient not taking: Reported on 02/14/2017 09/06/15   Reyne Dumas, MD  lisinopril (PRINIVIL,ZESTRIL) 5 MG tablet TAKE 1 TABLET (5 MG TOTAL) BY MOUTH DAILY. Patient not taking: Reported on 02/14/2017 07/22/16   Biagio Borg, MD  lovastatin (MEVACOR) 40 MG tablet TAKE 1 TABLET (40 MG TOTAL) BY MOUTH DAILY. Patient not taking: Reported on 02/14/2017 01/11/16   Biagio Borg, MD  pantoprazole (PROTONIX) 40 MG tablet Take 1 tablet (40 mg total) by mouth daily. Patient not taking: Reported on 02/14/2017 09/06/15   Reyne Dumas, MD  tadalafil (CIALIS) 20 MG tablet Take 1 tablet (20 mg total) by mouth daily as needed for erectile dysfunction. Patient not taking: Reported on 02/14/2017 07/17/16   Biagio Borg, MD    Physical Exam: Vitals:   02/14/17 2105 02/14/17 2130 02/14/17 2200 02/14/17 2230  BP:  (!) 145/83 129/82 (!) 141/84  Pulse:  84 84 84  Resp:  12 13 12   Temp: 98.7 F (37.1 C)     SpO2:  93% 90% 92%    Constitutional: NAD, calm, comfortable Eyes: PERRL, lids and conjunctivae normal ENMT: Mucous membranes are moist. Posterior pharynx clear of any exudate or lesions.Normal dentition.  Neck: normal, supple, no masses, no thyromegaly Respiratory: clear to auscultation bilaterally, no wheezing, no crackles. Normal respiratory effort. No accessory muscle use.  Cardiovascular: Regular rate and rhythm, no murmurs / rubs / gallops. No extremity edema. 2+ pedal pulses. No carotid bruits.  Abdomen: no tenderness, no masses palpated. No hepatosplenomegaly. Bowel sounds positive.  Musculoskeletal: no clubbing / cyanosis. No joint deformity  upper and lower extremities. Good ROM, no contractures. Normal muscle tone.  Skin: Erythema of BLE ankles, R >> L. Neurologic: CN 2-12 grossly intact. Sensation intact, DTR normal. Strength 5/5 in all 4.  Psychiatric: Normal judgment and insight. Alert and oriented x 3. Normal mood.    Labs on Admission: I have personally reviewed following labs and imaging studies  CBC:  Recent Labs Lab 02/14/17 1833  WBC 15.4*  NEUTROABS 13.5*  HGB 19.4*  HCT 59.6*  MCV 93.6  PLT 195*   Basic Metabolic Panel:  Recent Labs Lab 02/14/17 1833  NA 133*  K 4.3  CL 102  CO2 21*  GLUCOSE 151*  BUN 21*  CREATININE 1.65*  CALCIUM 9.4   GFR: Estimated Creatinine Clearance: 44.8 mL/min (A) (by C-G formula based on SCr of 1.65 mg/dL (H)). Liver Function Tests:  Recent Labs Lab 02/14/17 1833  AST 23  ALT 19  ALKPHOS 87  BILITOT 1.6*  PROT 7.4  ALBUMIN 4.0   No results for input(s): LIPASE, AMYLASE in the last 168 hours. No results for input(s): AMMONIA in the last 168 hours. Coagulation Profile: No results for input(s): INR, PROTIME in the last 168 hours. Cardiac Enzymes: No results for input(s): CKTOTAL, CKMB, CKMBINDEX, TROPONINI in the last 168 hours. BNP (last 3 results) No results for input(s): PROBNP in the last 8760 hours. HbA1C: No results for input(s): HGBA1C in the last 72 hours. CBG: No results for input(s): GLUCAP in the last 168 hours. Lipid Profile: No results for input(s): CHOL, HDL, LDLCALC, TRIG, CHOLHDL, LDLDIRECT in the last 72 hours. Thyroid Function Tests: No results for input(s): TSH, T4TOTAL, FREET4, T3FREE, THYROIDAB in the last 72 hours. Anemia Panel: No results for input(s): VITAMINB12, FOLATE, FERRITIN, TIBC, IRON, RETICCTPCT in the last 72 hours. Urine analysis:    Component Value Date/Time   COLORURINE YELLOW 01/23/2016 1723   APPEARANCEUR CLEAR 01/23/2016 1723   LABSPEC 1.010 01/23/2016 1723   PHURINE 6.0 01/23/2016 1723   GLUCOSEU NEGATIVE  01/23/2016 1723   HGBUR MODERATE (A) 01/23/2016 1723   BILIRUBINUR NEGATIVE 01/23/2016 1723   KETONESUR NEGATIVE 01/23/2016 1723   UROBILINOGEN 0.2 01/23/2016 1723   NITRITE NEGATIVE 01/23/2016 1723   LEUKOCYTESUR NEGATIVE 01/23/2016 1723    Radiological Exams on Admission: Dg Chest 2 View  Result Date: 02/14/2017 CLINICAL DATA:  Ankle, knee swelling EXAM: CHEST  2 VIEW COMPARISON:  08/31/2015 FINDINGS: Linear atelectasis or scarring at the left lung base. No pleural effusion. Cardiomediastinal silhouette within normal limits. No pneumothorax. IMPRESSION: Linear atelectasis or scarring at the left lung base. Electronically Signed   By: Donavan Foil M.D.   On: 02/14/2017 19:03    EKG: Independently reviewed.  Assessment/Plan Principal Problem:   Cellulitis of right lower extremity Active Problems:   Polycythemia   Essential hypertension, benign   NICM (nonischemic cardiomyopathy) (HCC)   CKD (chronic kidney disease), stage III    1. Cellulitis of RLE - 1. Failed outpatient 2. Also note initial SIRS with fever and leukocytosis 3. Will continue zosyn / vanc for now 4. Venous duplex ordered to r/o DVT 5. Continue Xarelto for now 6. Repeat CBC in AM 7. Tylenol for fever 2. Polycythemia - 1. HGB 19 2. Says he is taking hydrea, but I note that the current bottle of hydrea which is mostly full was filled in Aug of last year. 3. HTN - 1. Continue coreg, he says he takes it every day 2. I note that his coreg was filled in Dec last year 4. CKD stage 3 - chronic, follow BMPs while here, Creat up to 1.6 from baseline 1.2 but suspect this may just be due to bactrim (BUN is unchanged) 5. NICM - will get 2d echo to check EF.  If still 15-20% needs referral and follow up with cards as outpatient (actually he likely qualifies for an AICD in this case).  DVT prophylaxis: Xarelto Code Status: Full Family Communication: No family in room Disposition Plan: Home after admit Consults called:  None Admission status: Admit to inpatient - failed outpatient ABx therapy   Etta Quill DO [facility] Pager [phone]  If 7AM-7PM, please contact day team taking care of patient [email or web address] Password [email or web address]  02/14/2017, 10:54 PM

## 2017-02-14 NOTE — ED Provider Notes (Signed)
[facility] DEPT Provider Note   CSN: [id number] Arrival date & time: 02/14/17  1806     History   Chief Complaint Chief Complaint  Patient presents with  . Leg Swelling  . Fever    HPI is Dillon [name] is a 71 y.o. male who presents with bilateral leg swelling and redness. PMH significant for CHF EF 15-20% in 2016, CKD stage 3, thrombocytosis JAK2 positive on Hydroxyurea. He states that he started having R lower leg swelling about 10 days ago. The redness initially started at his ankle. He went to his doctor on 5/23 and was started on Keflex, Xarelto and was advised to get a doppler of his leg which he did not do. The swelling and redness progressed and started in his left leg as well. He went back to his PCP on 5/29 and was prescribed Bactrim. He has continued to worsen despite compliance with these medicines. He denies fever, chills, or severe pain. No CP or SOB. No known bites or wound.  HPI  Past Medical History:  Diagnosis Date  . Arthritis    right shoulder  . Bladder neck obstruction 04/18/2011  . CKD (chronic kidney disease), stage III 09/12/2015  . ERECTILE DYSFUNCTION 11/15/2008  . Essential hypertension, benign 04/28/2013  . FATIGUE 11/15/2008  . GERD 11/15/2008  . History of adenomatous polyp of colon 05/06/2014   2010  . HYPERLIPIDEMIA 11/15/2008  . Impaired glucose tolerance 04/18/2011  . LEG PAIN, LEFT 11/05/2010  . LVH (left ventricular hypertrophy) 04/27/2012  . MIGRAINE, COMMON 08/06/2010  . Nocturia 12/19/2008  . Polycythemia 04/27/2012  . Swelling of limb 11/05/2010  . THROMBOCYTOSIS 12/19/2008   myleoproliferative disorder    Patient Active Problem List   Diagnosis Date Noted  . Right leg swelling 02/05/2017  . Other bursitis of elbow, left elbow 01/28/2016  . CKD (chronic kidney disease), stage III 09/12/2015  . NICM (nonischemic cardiomyopathy) ([place])   . Acute systolic HF (heart failure) ([place])   . CAP (community acquired pneumonia) 09/01/2015  . History  of adenomatous polyp of colon 05/06/2014  . Tooth pain 10/29/2013  . Essential hypertension, benign 04/28/2013  . Polycythemia 04/27/2012  . LVH (left ventricular hypertrophy) 04/27/2012  . Fatigue 04/27/2012  . Left knee pain 05/27/2011  . Impaired glucose tolerance 04/18/2011  . Bladder neck obstruction 04/18/2011  . Skin lesion 04/18/2011  . Encounter for preventative adult health care exam with abnormal findings 04/17/2011  . LEG PAIN, LEFT 11/05/2010  . MIGRAINE, COMMON 08/06/2010  . Thrombocytosis ([place]) 12/19/2008  . Nocturia 12/19/2008  . Hyperlipidemia 11/15/2008  . ERECTILE DYSFUNCTION 11/15/2008  . GERD 11/15/2008    Past Surgical History:  Procedure Laterality Date  . APPENDECTOMY    . CARDIAC CATHETERIZATION N/A 09/05/2015   Procedure: Left Heart Cath and Coronary Angiography;  Surgeon: Troy Sine, MD;  Location: [facility];  Service: Cardiovascular;  Laterality: N/A;  . CIRCUMCISION     30+yrs ago  . COLONOSCOPY    . POLYPECTOMY    . s/p esophageal dilation    . TONSILLECTOMY    . UPPER GASTROINTESTINAL ENDOSCOPY         Home Medications    Prior to Admission medications   Medication Sig Start Date End Date Taking? Authorizing Provider  amLODipine (NORVASC) 5 MG tablet TAKE 1 TABLET BY MOUTH DAILY 07/22/16   Biagio Borg, MD  aspirin 81 MG tablet Take 81 mg by mouth daily.      [provider]  carvedilol (COREG) 12.5 MG tablet TAKE 1 TABLET (12.5 MG TOTAL) BY MOUTH 2 (TWO) TIMES DAILY WITH A MEAL. 07/22/16   Biagio Borg, MD  furosemide (LASIX) 20 MG tablet Take 1 tablet (20 mg total) by mouth daily. 09/06/15   Reyne Dumas, MD  guaiFENesin (MUCINEX) 600 MG 12 hr tablet Take 1 tablet (600 mg total) by mouth 2 (two) times daily. 09/06/15   Reyne Dumas, MD  hydroxyurea (HYDREA) 500 MG capsule TAKE 1 CAPSULE BY MOUTH TWO TIMES DAILY. TAKE WITH FOOD TO MINIMIZE GI SIDE EFFECTS 10/04/16   Wyatt Portela, MD  lisinopril (PRINIVIL,ZESTRIL)  5 MG tablet TAKE 1 TABLET (5 MG TOTAL) BY MOUTH DAILY. 07/22/16   Biagio Borg, MD  lovastatin (MEVACOR) 40 MG tablet TAKE 1 TABLET (40 MG TOTAL) BY MOUTH DAILY. 01/11/16   Biagio Borg, MD  pantoprazole (PROTONIX) 40 MG tablet Take 1 tablet (40 mg total) by mouth daily. 09/06/15   Reyne Dumas, MD  Rivaroxaban (XARELTO STARTER PACK) 15 & 20 MG TBPK Take as directed on package: Start with one 15mg  tablet by mouth twice a day with food. On Day 22, switch to one 20mg  tablet once a day with food. 02/05/17   Biagio Borg, MD  sulfamethoxazole-trimethoprim (BACTRIM DS,SEPTRA DS) 800-160 MG tablet Take 1 tablet by mouth 2 (two) times daily. 02/11/17   Biagio Borg, MD  tadalafil (CIALIS) 20 MG tablet Take 1 tablet (20 mg total) by mouth daily as needed for erectile dysfunction. 07/17/16   Biagio Borg, MD  tamsulosin (FLOMAX) 0.4 MG CAPS capsule TAKE 1 CAPSULE (0.4 MG TOTAL) BY MOUTH DAILY. 04/08/16   Biagio Borg, MD  traMADol (ULTRAM) 50 MG tablet Take 1 tablet (50 mg total) by mouth every 6 (six) hours as needed. 02/11/17   Biagio Borg, MD    Family History Family History  Problem Relation Age of Onset  . Hypertension Mother   . Cancer Father        prostate cancer  . Prostate cancer Father   . Hypertension Sister   . Hypertension Brother   . Colon cancer Neg Hx   . Rectal cancer Neg Hx   . Stomach cancer Neg Hx   . Esophageal cancer Neg Hx   . Pancreatic cancer Neg Hx     Social History Social History  Substance Use Topics  . Smoking status: Former Smoker    Packs/day: 0.50    Years: 12.00    Types: Cigarettes    Quit date: 09/16/1970  . Smokeless tobacco: Never Used     Comment: hasn't smoked in 46 years;   . Alcohol use 0.0 oz/week     Comment: occasional;      Allergies   Patient has no known allergies.   Review of Systems Review of Systems  Constitutional: Negative for chills and fever.  Respiratory: Negative for shortness of breath.   Cardiovascular: Negative for  chest pain.  Musculoskeletal: Positive for joint swelling. Negative for gait problem.  Skin: Positive for color change. Negative for wound.  Neurological: Negative for weakness and numbness.  All other systems reviewed and are negative.    Physical Exam Updated Vital Signs BP (!) 150/83   Pulse 100   Temp (!) 102 F (38.9 C)   Resp 16   SpO2 96%   Physical Exam  Constitutional: He is oriented to person, place, and time. He appears well-developed and well-nourished. No distress.  HENT:  Head: Normocephalic and atraumatic.  Eyes: Conjunctivae are normal. Pupils are equal, round, and reactive to light. Right eye exhibits no discharge. Left eye exhibits no discharge. No scleral icterus.  Neck: Normal range of motion.  Cardiovascular: Normal rate.   Pulmonary/Chest: Effort normal. No respiratory distress.  Abdominal: He exhibits no distension.  Neurological: He is alert and oriented to person, place, and time.  Skin: Skin is warm and dry.  3+ pitting edema R worse than L. Erythema of R leg from ankle to distal thigh. Minimal tenderness to palpation. Erythema of L ankle extending to distal L shin  Psychiatric: He has a normal mood and affect. His behavior is normal.  Nursing note and vitals reviewed.       ED Treatments / Results  Labs (all labs ordered are listed, but only abnormal results are displayed) Labs Reviewed  COMPREHENSIVE METABOLIC PANEL - Abnormal; Notable for the following:       Result Value   Sodium 133 (*)    CO2 21 (*)    Glucose, Bld 151 (*)    BUN 21 (*)    Creatinine, Ser 1.65 (*)    Total Bilirubin 1.6 (*)    GFR calc non Af Amer 40 (*)    GFR calc Af Amer 46 (*)    All other components within normal limits  CBC WITH DIFFERENTIAL/PLATELET - Abnormal; Notable for the following:    WBC 15.4 (*)    RBC 6.37 (*)    Hemoglobin 19.4 (*)    HCT 59.6 (*)    Platelets 580 (*)    Neutro Abs 13.5 (*)    All other components within normal limits  I-STAT  CG4 LACTIC ACID, ED - Abnormal; Notable for the following:    Lactic Acid, Venous 2.04 (*)    All other components within normal limits  CULTURE, BLOOD (ROUTINE X 2)  CULTURE, BLOOD (ROUTINE X 2)  BRAIN NATRIURETIC PEPTIDE  URINALYSIS, ROUTINE W REFLEX MICROSCOPIC  CBC  BASIC METABOLIC PANEL  I-STAT CG4 LACTIC ACID, ED    EKG  EKG Interpretation None       Radiology Dg Chest 2 View  Result Date: 02/14/2017 CLINICAL DATA:  Ankle, knee swelling EXAM: CHEST  2 VIEW COMPARISON:  08/31/2015 FINDINGS: Linear atelectasis or scarring at the left lung base. No pleural effusion. Cardiomediastinal silhouette within normal limits. No pneumothorax. IMPRESSION: Linear atelectasis or scarring at the left lung base. Electronically Signed   By: Donavan Foil M.D.   On: 02/14/2017 19:03    Procedures Procedures (including critical care time)  Medications Ordered in ED Medications  acetaminophen (TYLENOL) 325 MG tablet (not administered)  vancomycin (VANCOCIN) IVPB 1000 mg/200 mL premix (not administered)  piperacillin-tazobactam (ZOSYN) IVPB 3.375 g (not administered)  hydroxyurea (HYDREA) capsule 500 mg (not administered)  carvedilol (COREG) tablet 12.5 mg (not administered)  Rivaroxaban (XARELTO) tablet 15 mg (not administered)    Followed by  rivaroxaban (XARELTO) tablet 20 mg (not administered)  acetaminophen (TYLENOL) tablet 650 mg (650 mg Oral Given 02/14/17 1836)  piperacillin-tazobactam (ZOSYN) IVPB 3.375 g (0 g Intravenous Stopped 02/14/17 2151)  vancomycin (VANCOCIN) IVPB 1000 mg/200 mL premix (0 mg Intravenous Stopped 02/14/17 2221)     Initial Impression / Assessment and Plan / ED Course  I have reviewed the triage vital signs and the nursing notes.  Pertinent labs & imaging results that were available during my care of the patient were reviewed by me and considered in my medical decision making (see  chart for details).  71 year old male with cellulitis who has failed outpatient  therapy. He has a fever of 102 and is tachycardic. He is hypertensive as well. Code sepsis was initiated. He has a leukocytosis which is chronic due to polycythemia. CMP remarkable for hyponatremia, low bicarb, hyperglycemia (151), elevated Cr (1.65 compared to 1.25 a year ago). Lactic acid is 2.04. Blood cultures ordered and Vanc and Zosyn started. Will hold off on fluids at this point due to hx of severe CHF with no recent Echo. Spoke with Dr. Alcario Drought who will admit.  Final Clinical Impressions(s) / ED Diagnoses   Final diagnoses:  Cellulitis of right lower extremity  Sepsis, due to unspecified organism ([place])  Fever, unspecified fever cause    New Prescriptions New Prescriptions   No medications on file     Iris Pert 02/14/17 2304    Varney Biles, MD 02/16/17 [phone]

## 2017-02-15 ENCOUNTER — Inpatient Hospital Stay (HOSPITAL_COMMUNITY): Payer: 59

## 2017-02-15 ENCOUNTER — Encounter (HOSPITAL_COMMUNITY): Payer: Self-pay | Admitting: *Deleted

## 2017-02-15 DIAGNOSIS — N183 Chronic kidney disease, stage 3 (moderate): Secondary | ICD-10-CM

## 2017-02-15 DIAGNOSIS — I10 Essential (primary) hypertension: Secondary | ICD-10-CM

## 2017-02-15 DIAGNOSIS — I428 Other cardiomyopathies: Secondary | ICD-10-CM

## 2017-02-15 DIAGNOSIS — L03115 Cellulitis of right lower limb: Secondary | ICD-10-CM

## 2017-02-15 DIAGNOSIS — M7989 Other specified soft tissue disorders: Secondary | ICD-10-CM

## 2017-02-15 DIAGNOSIS — I429 Cardiomyopathy, unspecified: Secondary | ICD-10-CM

## 2017-02-15 LAB — ECHOCARDIOGRAM COMPLETE
EWDT: 317 ms
FS: 21 % — AB (ref 28–44)
Height: 69 in
IVS/LV PW RATIO, ED: 1.1
LA ID, A-P, ES: 48 mm
LA diam end sys: 48 mm
LA diam index: 2.32 cm/m2
LA vol A4C: 51.6 ml
LA vol: 57 mL
LAVOLIN: 27.5 mL/m2
LV TDI E'LATERAL: 7.72
LVELAT: 7.72 cm/s
LVOT VTI: 18.9 cm
LVOT area: 3.14 cm2
LVOT diameter: 20 mm
LVOT peak grad rest: 3 mmHg
LVOTPV: 87.9 cm/s
LVOTSV: 59 mL
Lateral S' vel: 15 cm/s
MV Dec: 317
MV pk E vel: 0.6 m/s
PW: 10 mm — AB (ref 0.6–1.1)
TAPSE: 19.4 mm
TDI e' medial: 4.13
WEIGHTICAEL: 3232 [oz_av]

## 2017-02-15 LAB — GLUCOSE, CAPILLARY: Glucose-Capillary: 124 mg/dL — ABNORMAL HIGH (ref 65–99)

## 2017-02-15 LAB — CBC
HCT: 56.8 % — ABNORMAL HIGH (ref 39.0–52.0)
Hemoglobin: 18.2 g/dL — ABNORMAL HIGH (ref 13.0–17.0)
MCH: 30.3 pg (ref 26.0–34.0)
MCHC: 32 g/dL (ref 30.0–36.0)
MCV: 94.5 fL (ref 78.0–100.0)
PLATELETS: 491 10*3/uL — AB (ref 150–400)
RBC: 6.01 MIL/uL — ABNORMAL HIGH (ref 4.22–5.81)
RDW: 14.6 % (ref 11.5–15.5)
WBC: 15.7 10*3/uL — ABNORMAL HIGH (ref 4.0–10.5)

## 2017-02-15 LAB — BASIC METABOLIC PANEL
Anion gap: 10 (ref 5–15)
BUN: 21 mg/dL — ABNORMAL HIGH (ref 6–20)
CALCIUM: 9.2 mg/dL (ref 8.9–10.3)
CO2: 25 mmol/L (ref 22–32)
Chloride: 103 mmol/L (ref 101–111)
Creatinine, Ser: 1.54 mg/dL — ABNORMAL HIGH (ref 0.61–1.24)
GFR calc non Af Amer: 43 mL/min — ABNORMAL LOW (ref >60)
GFR, EST AFRICAN AMERICAN: 50 mL/min — AB (ref >60)
Glucose, Bld: 92 mg/dL (ref 65–99)
Potassium: 4.2 mmol/L (ref 3.5–5.1)
SODIUM: 138 mmol/L (ref 135–145)

## 2017-02-15 NOTE — Progress Notes (Signed)
New Admission Note:  Arrival Method: Stretcher Mental Orientation: Alert and oriented x 4 Telemetry: Box 02, NSR Assessment: Completed Skin: Warm and dry  IV: NSL Pain: Denies  Tubes: N/A Safety Measures: Safety Fall Prevention Plan was given, discussed and signed. Admission: Completed [facility] [address] Orientation: Patient has been orientated to the room, unit and the staff. Family: None  Orders have been reviewed and implemented. Will continue to monitor the patient. Call light has been placed within reach and bed alarm has been activated.   Sima Matas BSN, RN  Phone Number: [phone]

## 2017-02-15 NOTE — Progress Notes (Signed)
PROGRESS NOTE  Dillon [name]  IWP:[id number] DOB: [birth year] DOA: 02/14/2017 PCP: Biagio Borg, MD   Brief Narrative: Dillon [name] is a 71 y.o. male with medical history significant of NICM with EF 15-20% on last echo in 2016, normal coronaries on cath at that time, no follow up echo (looks like he may have been lost to follow up); JAK2-positive polycythemia on hydrea, and stage III CKD who presented to the ED 6/1 with RLE swelling and redness worsening over the past 2 weeks despite antibiotic therapy. He reported worsening redness of the right lower leg and the beginning of swelling in the left lower leg for which he was seen by his PCP, started on keflex without improvement. Xarelto was also prescribed at that time with concern for DVT, though the pt did not go for dopplers. When leg failed to improve, the patient returned to PCP 5/29 and was prescribed bactrim without improvement. On arrival, he was febrile to 102F , WBC 15.4k, lactate 2.0, BNP 55. Broad spectrum antibiotics were started and the patient was admitted for cellulitis having failed outpatient management.  Assessment & Plan: Principal Problem:   Cellulitis of right lower extremity Active Problems:   Polycythemia   Essential hypertension, benign   NICM (nonischemic cardiomyopathy) ([facility])   CKD (chronic kidney disease), stage III  Sepsis due to right lower leg cellulitis: Fever and leukocytosis on admission. Failed outpatient management. Last HbA1c was 6%, treating impaired glucose tolerance with lifestyle modifications.  - Continue vancomycin, zosyn  - Monitor cultures - Venous duplex to r/o DVT, in the meantime, continue xarelto. If +DVT, would consider change to coumadin if renal function does not improve.  - Recheck HbA1c  Polycythemia, thrombocytosis: Followed by Dr. Alen Blew. Admitting MD with concern for nonadherence to hydrea.  - Continue hydrea.   HTN:  - Continue coreg - Monitor BP, if remains elevated,  consider titrating dose as outpatient, as values have been at goal.   Stage III CKD: With possible AKI/pseudoAKI due to bactrim. SCr baseline appears to be 1.2.  - With severe LV dysfunction, would consider low dose ACE/ARB if tolerable.   NICM with chronic HFrEF: EF 15-20% with negative cath.  - Repeating echo - BNP not elevated  DVT prophylaxis: Xarelto Code Status: Full Family Communication: None at bedside Disposition Plan: Anticipate DC to home  Consultants:   None  Procedures:   Echocardiogram 6/2  LE dopplers ordered  Antimicrobials:  Vancomycin, zosyn 6/1 >>    Subjective: No fevers. Endorses above history. LE edema unchanged, redness stable.   Objective: Vitals:   02/14/17 2330 02/15/17 0005 02/15/17 0434 02/15/17 0819  BP: 127/82 (!) 152/88 (!) 168/81 (!) 155/77  Pulse: 79 82 87 84  Resp: 13 16 16 16   Temp:  97.7 F (36.5 C) 97.7 F (36.5 C) 97.9 F (36.6 C)  TempSrc:  Oral Oral Oral  SpO2: 93% 99% 98% 98%  Weight:  91.6 kg (202 lb)    Height:  5\' 9"  (1.753 m)      Intake/Output Summary (Last 24 hours) at 02/15/17 1616 Last data filed at 02/15/17 1446  Gross per 24 hour  Intake              530 ml  Output             1275 ml  Net             -745 ml   Filed Weights   02/15/17 0005  Weight: 91.6  kg (202 lb)    Examination: General exam: 71 y.o. male in no distress Respiratory system: Non-labored breathing room air. Clear to auscultation bilaterally.  Cardiovascular system: Regular rate and rhythm. No murmur, rub, or gallop. No JVD. Gastrointestinal system: Abdomen soft, non-tender, non-distended, with normoactive bowel sounds. No organomegaly or masses felt. Central nervous system: Alert and oriented. No focal neurological deficits. Extremities: Warm, no deformities Skin: Bright, warm erythema along posteromedial left lower leg extending proximal to knee and including ankle. Ankle with full AROM. No discharge, fluctuance. Left posterior lower  calf is erythematous with induration as well.  Psychiatry: Judgement and insight appear normal. Mood & affect appropriate.   Data Reviewed: I have personally reviewed following labs and imaging studies  CBC:  Recent Labs Lab 02/14/17 1833 02/15/17 0456  WBC 15.4* 15.7*  NEUTROABS 13.5*  --   HGB 19.4* 18.2*  HCT 59.6* 56.8*  MCV 93.6 94.5  PLT 580* 026*   Basic Metabolic Panel:  Recent Labs Lab 02/14/17 1833 02/15/17 0456  NA 133* 138  K 4.3 4.2  CL 102 103  CO2 21* 25  GLUCOSE 151* 92  BUN 21* 21*  CREATININE 1.65* 1.54*  CALCIUM 9.4 9.2   GFR: Estimated Creatinine Clearance: 47.8 mL/min (A) (by C-G formula based on SCr of 1.54 mg/dL (H)). Liver Function Tests:  Recent Labs Lab 02/14/17 1833  AST 23  ALT 19  ALKPHOS 87  BILITOT 1.6*  PROT 7.4  ALBUMIN 4.0   No results for input(s): LIPASE, AMYLASE in the last 168 hours. No results for input(s): AMMONIA in the last 168 hours. Coagulation Profile: No results for input(s): INR, PROTIME in the last 168 hours. Cardiac Enzymes: No results for input(s): CKTOTAL, CKMB, CKMBINDEX, TROPONINI in the last 168 hours. BNP (last 3 results) No results for input(s): PROBNP in the last 8760 hours. HbA1C: No results for input(s): HGBA1C in the last 72 hours. CBG: No results for input(s): GLUCAP in the last 168 hours. Lipid Profile: No results for input(s): CHOL, HDL, LDLCALC, TRIG, CHOLHDL, LDLDIRECT in the last 72 hours. Thyroid Function Tests: No results for input(s): TSH, T4TOTAL, FREET4, T3FREE, THYROIDAB in the last 72 hours. Anemia Panel: No results for input(s): VITAMINB12, FOLATE, FERRITIN, TIBC, IRON, RETICCTPCT in the last 72 hours. Urine analysis:    Component Value Date/Time   COLORURINE YELLOW 01/23/2016 1723   APPEARANCEUR CLEAR 01/23/2016 1723   LABSPEC 1.010 01/23/2016 1723   PHURINE 6.0 01/23/2016 1723   GLUCOSEU NEGATIVE 01/23/2016 1723   HGBUR MODERATE (A) 01/23/2016 1723   BILIRUBINUR  NEGATIVE 01/23/2016 1723   KETONESUR NEGATIVE 01/23/2016 1723   UROBILINOGEN 0.2 01/23/2016 1723   NITRITE NEGATIVE 01/23/2016 1723   LEUKOCYTESUR NEGATIVE 01/23/2016 1723   No results found for this or any previous visit (from the past 240 hour(s)).    Radiology Studies: Dg Chest 2 View  Result Date: 02/14/2017 CLINICAL DATA:  Ankle, knee swelling EXAM: CHEST  2 VIEW COMPARISON:  08/31/2015 FINDINGS: Linear atelectasis or scarring at the left lung base. No pleural effusion. Cardiomediastinal silhouette within normal limits. No pneumothorax. IMPRESSION: Linear atelectasis or scarring at the left lung base. Electronically Signed   By: Donavan Foil M.D.   On: 02/14/2017 19:03    Scheduled Meds: . carvedilol  12.5 mg Oral BID WC  . hydroxyurea  500 mg Oral BID WC  . rivaroxaban  15 mg Oral BID WC   Followed by  . [START ON 02/25/2017] rivaroxaban  20 mg Oral Q  supper   Continuous Infusions: . piperacillin-tazobactam (ZOSYN)  IV 3.375 g (02/15/17 1329)  . vancomycin       LOS: 1 day   Time spent: 25 minutes.  Vance Gather, MD [facility] Pager [phone]  If 7PM-7AM, please contact night-coverage [email or web address] Password [email or web address] 02/15/2017, 4:16 PM

## 2017-02-15 NOTE — Progress Notes (Signed)
*  PRELIMINARY RESULTS* Vascular Ultrasound Lower extremity venous duplex has been completed.  Preliminary findings: No evidence of DVT or baker's cyst.    Landry Mellow, RDMS, RVT  02/15/2017, 6:04 PM

## 2017-02-15 NOTE — Progress Notes (Signed)
  Echocardiogram 2D Echocardiogram has been performed.  Shakima Nisley T Walt Geathers 02/15/2017, 4:49 PM

## 2017-02-16 DIAGNOSIS — A419 Sepsis, unspecified organism: Principal | ICD-10-CM

## 2017-02-16 LAB — BASIC METABOLIC PANEL
Anion gap: 8 (ref 5–15)
BUN: 17 mg/dL (ref 6–20)
CALCIUM: 9.2 mg/dL (ref 8.9–10.3)
CO2: 23 mmol/L (ref 22–32)
CREATININE: 1.27 mg/dL — AB (ref 0.61–1.24)
Chloride: 103 mmol/L (ref 101–111)
GFR, EST NON AFRICAN AMERICAN: 54 mL/min — AB (ref >60)
Glucose, Bld: 91 mg/dL (ref 65–99)
Potassium: 4 mmol/L (ref 3.5–5.1)
SODIUM: 134 mmol/L — AB (ref 135–145)

## 2017-02-16 LAB — CBC
HCT: 54 % — ABNORMAL HIGH (ref 39.0–52.0)
HEMOGLOBIN: 17.1 g/dL — AB (ref 13.0–17.0)
MCH: 30 pg (ref 26.0–34.0)
MCHC: 31.7 g/dL (ref 30.0–36.0)
MCV: 94.7 fL (ref 78.0–100.0)
PLATELETS: 502 10*3/uL — AB (ref 150–400)
RBC: 5.7 MIL/uL (ref 4.22–5.81)
RDW: 14.5 % (ref 11.5–15.5)
WBC: 14.8 10*3/uL — ABNORMAL HIGH (ref 4.0–10.5)

## 2017-02-16 NOTE — Progress Notes (Signed)
PROGRESS NOTE  Dillon [name]  WUJ:[id number] DOB: [birth year] DOA: 02/14/2017 PCP: Biagio Borg, MD   Brief Narrative: Dillon [name] is a 71 y.o. male with medical history significant of NICM with EF 15-20% on last echo in 2016, normal coronaries on cath at that time, no follow up echo (looks like he may have been lost to follow up); JAK2-positive polycythemia on hydrea, and stage III CKD who presented to the ED 6/1 with RLE swelling and redness worsening over the past 2 weeks despite antibiotic therapy. He reported worsening redness of the right lower leg and the beginning of swelling in the left lower leg for which he was seen by his PCP, started on keflex without improvement. Xarelto was also prescribed at that time with concern for DVT, though the pt did not go for dopplers. When leg failed to improve, the patient returned to PCP 5/29 and was prescribed bactrim without improvement. On arrival, he was febrile to 102F , WBC 15.4k, lactate 2.0, BNP 55. Broad spectrum antibiotics were started and the patient was admitted for cellulitis having failed outpatient management.  Assessment & Plan: Principal Problem:   Cellulitis of right lower extremity Active Problems:   Polycythemia   Essential hypertension, benign   NICM (nonischemic cardiomyopathy) (HCC)   CKD (chronic kidney disease), stage III  Sepsis due to right lower leg cellulitis: Fever and leukocytosis on admission. Failed outpatient management. Last HbA1c was 6%, treating impaired glucose tolerance with lifestyle modifications.  - Continue vancomycin, zosyn, will narrow 6/4 if leukocytosis and erythema continue current (modest) improvement. - Monitor cultures - Recheck HbA1c, in process. - Venous duplex negative for DVT, will DC xarelto.   Polycythemia, thrombocytosis: Followed by Dr. Alen Blew. Admitting MD with concern for nonadherence to hydrea.  - Continue hydrea. ?compliance as indices are not macrocytic.   HTN:  -  Continue coreg - Intermittently above goal for age here. If remains elevated, consider titrating dose as outpatient.  AKI on Stage III CKD: SCr 1.65 on admission, and has improved by >0.3 to approximate baseline 1.2.  - Continue monitoring.   NICM with chronic HFrEF: Updated echo shows improved LV systolic and diastolic function, EF 95-62%. Last EF 15-20% with negative cath. BNP not elevated, appears compensated.  - I/O, daily weights.  - DC tele.  - Will need cardiology follow up. - With severe LV dysfunction, would consider low dose ACE/ARB if tolerable. Defer to outpatient setting.    DVT prophylaxis: Xarelto Code Status: Full Family Communication: None at bedside Disposition Plan: Anticipate DC to home once improved. Likely 24-48hrs.  Consultants:   None  Procedures:   Echocardiogram 6/2: - Left ventricle: The cavity size was mildly dilated. Wall   thickness was normal. Systolic function was moderately reduced.   The estimated ejection fraction was in the range of 35% to 40%.   Diffuse hypokinesis. Doppler parameters are consistent with   abnormal left ventricular relaxation (grade 1 diastolic   dysfunction). - Aortic valve: There was trivial regurgitation. - Left atrium: The atrium was moderately dilated.  Impressions: - Compared to 1308, LV systolic and diastolic function have   improved.   LE dopplers 02/15/2017: Preliminary read: No evidence of DVT or baker's cyst.  Antimicrobials:  Vancomycin, zosyn 6/1 >>    Subjective: No fevers, feels redness is improved but still significantly painful. Hasn't been elevating extremity.   Objective: Vitals:   02/15/17 1722 02/15/17 2039 02/16/17 0403 02/16/17 0726  BP: (!) 145/79 (!) 163/95 140/68 Marland Kitchen)  143/65  Pulse: 77 92 88 80  Resp: 16 16 17 18   Temp: 98.2 F (36.8 C) 100.1 F (37.8 C) 100.1 F (37.8 C) 98.5 F (36.9 C)  TempSrc: Oral Oral Oral Oral  SpO2: 98% 99% 99% 98%  Weight:  92.2 kg (203 lb 3.2 oz)      Height:        Intake/Output Summary (Last 24 hours) at 02/16/17 1009 Last data filed at 02/16/17 3016  Gross per 24 hour  Intake             1350 ml  Output             2975 ml  Net            -1625 ml   Filed Weights   02/15/17 0005 02/15/17 2039  Weight: 91.6 kg (202 lb) 92.2 kg (203 lb 3.2 oz)    Examination: General exam: Calm, elderly male in no distress Respiratory system: Non-labored breathing room air. Clear to auscultation bilaterally.  Cardiovascular system: Regular rate and rhythm. No murmur, rub, or gallop. No JVD. Gastrointestinal system: Abdomen soft, non-tender, non-distended, with normoactive bowel sounds. No organomegaly or masses felt. Central nervous system: Alert and oriented. No focal neurological deficits. Extremities: Warm, no deformities Skin: Bright, warm erythema along posteromedial left lower leg with induration, extending proximal to knee and including ankle. Proximal margins less intensely erythematous today. Ankle with full AROM. No discharge, fluctuance. Left posterior lower calf is erythematous though induration has improved.   Psychiatry: Judgement and insight appear normal. Mood & affect appropriate.   Data Reviewed: I have personally reviewed following labs and imaging studies  CBC:  Recent Labs Lab 02/14/17 1833 02/15/17 0456 02/16/17 0400  WBC 15.4* 15.7* 14.8*  NEUTROABS 13.5*  --   --   HGB 19.4* 18.2* 17.1*  HCT 59.6* 56.8* 54.0*  MCV 93.6 94.5 94.7  PLT 580* 491* 010*   Basic Metabolic Panel:  Recent Labs Lab 02/14/17 1833 02/15/17 0456 02/16/17 0400  NA 133* 138 134*  K 4.3 4.2 4.0  CL 102 103 103  CO2 21* 25 23  GLUCOSE 151* 92 91  BUN 21* 21* 17  CREATININE 1.65* 1.54* 1.27*  CALCIUM 9.4 9.2 9.2   GFR: Estimated Creatinine Clearance: 58.1 mL/min (A) (by C-G formula based on SCr of 1.27 mg/dL (H)). Liver Function Tests:  Recent Labs Lab 02/14/17 1833  AST 23  ALT 19  ALKPHOS 87  BILITOT 1.6*  PROT 7.4   ALBUMIN 4.0   No results for input(s): LIPASE, AMYLASE in the last 168 hours. No results for input(s): AMMONIA in the last 168 hours. Coagulation Profile: No results for input(s): INR, PROTIME in the last 168 hours. Cardiac Enzymes: No results for input(s): CKTOTAL, CKMB, CKMBINDEX, TROPONINI in the last 168 hours. BNP (last 3 results) No results for input(s): PROBNP in the last 8760 hours. HbA1C: No results for input(s): HGBA1C in the last 72 hours. CBG:  Recent Labs Lab 02/15/17 2038  GLUCAP 124*   Lipid Profile: No results for input(s): CHOL, HDL, LDLCALC, TRIG, CHOLHDL, LDLDIRECT in the last 72 hours. Thyroid Function Tests: No results for input(s): TSH, T4TOTAL, FREET4, T3FREE, THYROIDAB in the last 72 hours. Anemia Panel: No results for input(s): VITAMINB12, FOLATE, FERRITIN, TIBC, IRON, RETICCTPCT in the last 72 hours. Urine analysis:    Component Value Date/Time   COLORURINE YELLOW 01/23/2016 [place] 01/23/2016 1723   LABSPEC 1.010 01/23/2016 1723   PHURINE  6.0 01/23/2016 1723   GLUCOSEU NEGATIVE 01/23/2016 1723   HGBUR MODERATE (A) 01/23/2016 1723   BILIRUBINUR NEGATIVE 01/23/2016 1723   KETONESUR NEGATIVE 01/23/2016 1723   UROBILINOGEN 0.2 01/23/2016 1723   NITRITE NEGATIVE 01/23/2016 1723   LEUKOCYTESUR NEGATIVE 01/23/2016 1723   No results found for this or any previous visit (from the past 240 hour(s)).    Radiology Studies: Dg Chest 2 View  Result Date: 02/14/2017 CLINICAL DATA:  Ankle, knee swelling EXAM: CHEST  2 VIEW COMPARISON:  08/31/2015 FINDINGS: Linear atelectasis or scarring at the left lung base. No pleural effusion. Cardiomediastinal silhouette within normal limits. No pneumothorax. IMPRESSION: Linear atelectasis or scarring at the left lung base. Electronically Signed   By: Donavan Foil M.D.   On: 02/14/2017 19:03    Scheduled Meds: . carvedilol  12.5 mg Oral BID WC  . hydroxyurea  500 mg Oral BID WC  . rivaroxaban  15  mg Oral BID WC   Followed by  . [START ON 02/25/2017] rivaroxaban  20 mg Oral Q supper   Continuous Infusions: . piperacillin-tazobactam (ZOSYN)  IV 3.375 g (02/16/17 0628)  . vancomycin Stopped (02/15/17 2304)     LOS: 2 days   Time spent: 25 minutes.  Vance Gather, MD [facility] Pager [phone]  If 7PM-7AM, please contact night-coverage [email or web address] Password [email or web address] 02/16/2017, 10:09 AM

## 2017-02-17 LAB — BASIC METABOLIC PANEL
Anion gap: 9 (ref 5–15)
BUN: 14 mg/dL (ref 6–20)
CHLORIDE: 105 mmol/L (ref 101–111)
CO2: 24 mmol/L (ref 22–32)
Calcium: 9.3 mg/dL (ref 8.9–10.3)
Creatinine, Ser: 1.12 mg/dL (ref 0.61–1.24)
GFR calc Af Amer: >60 mL/min (ref >60)
GFR calc non Af Amer: >60 mL/min (ref >60)
Glucose, Bld: 101 mg/dL — ABNORMAL HIGH (ref 65–99)
POTASSIUM: 4.3 mmol/L (ref 3.5–5.1)
SODIUM: 138 mmol/L (ref 135–145)

## 2017-02-17 LAB — HEMOGLOBIN A1C
Hgb A1c MFr Bld: 5.7 % — ABNORMAL HIGH (ref 4.8–5.6)
Mean Plasma Glucose: 117 mg/dL

## 2017-02-17 LAB — CBC
HEMATOCRIT: 53.5 % — AB (ref 39.0–52.0)
Hemoglobin: 17 g/dL (ref 13.0–17.0)
MCH: 30.1 pg (ref 26.0–34.0)
MCHC: 31.8 g/dL (ref 30.0–36.0)
MCV: 94.9 fL (ref 78.0–100.0)
Platelets: 454 10*3/uL — ABNORMAL HIGH (ref 150–400)
RBC: 5.64 MIL/uL (ref 4.22–5.81)
RDW: 14.6 % (ref 11.5–15.5)
WBC: 12.6 10*3/uL — AB (ref 4.0–10.5)

## 2017-02-17 MED ORDER — SACCHAROMYCES BOULARDII 250 MG PO CAPS: 250.0000 mg | ORAL_CAPSULE | Freq: Two times a day (BID) | ORAL | 0 refills | Status: DC

## 2017-02-17 MED ORDER — ENOXAPARIN SODIUM 40 MG/0.4ML ~~LOC~~ SOLN
40.0000 mg | SUBCUTANEOUS | Status: DC
  Administered 2017-02-17: 40 mg via SUBCUTANEOUS
  Filled 2017-02-17: qty 0.4

## 2017-02-17 MED ORDER — CLINDAMYCIN HCL 300 MG PO CAPS: 300.0000 mg | ORAL_CAPSULE | Freq: Three times a day (TID) | ORAL | 0 refills | Status: DC

## 2017-02-17 NOTE — Progress Notes (Signed)
Patient discharged to home, AVS reviewed, patient instructed to pick up prescriptions from their pharmacy and make follow up appointment, IV was removed. Patient left floor via wheelchair with volunteer

## 2017-02-17 NOTE — Discharge Summary (Signed)
Physician Discharge Summary  Dillon [name] TDD:[id number] DOB: [birth year] DOA: 02/14/2017  PCP: Biagio Borg, MD  Admit date: 02/14/2017 Discharge date: 02/17/2017  Admitted From: Home Disposition: Home   Recommendations for Outpatient Follow-up:  1. Follow up with PCP in 1-2 weeks for recheck of cellulitis.  2. Needs to reestablish with cardiology for nonischemic cardiomyopathy and chronic HFrEF (updated echo showing improved EF to 35-40%). 3. Discharged on clindamycin and saccharomyces probiotic.   Home Health: None Equipment/Devices: None Discharge Condition: Stable CODE STATUS: Full Diet recommendation: Heart healthy, carb-modified  Brief/Interim Summary: Dillon L Hendersonis a 71 y.o.malewith medical history significant of NICM with EF 15-20% on last echo in 2016, normal coronaries on cath at that time, no follow up echo (looks like he may have been lost to follow up); JAK2-positive polycythemia on hydrea, and stage III CKD who presented to the ED 6/1 with RLE swelling and redness worsening over the past 2 weeks despite antibiotic therapy. He reported worsening redness of the right lower leg and the beginning of swelling in the left lower leg for which he was seen by his PCP, started on keflex without improvement. Xarelto was also prescribed at that time with concern for DVT, though the pt did not go for dopplers. When leg failed to improve, the patient returned to PCP 5/29 and was prescribed bactrim without improvement. On arrival, he was febrile to 102F , WBC 15.4k, lactate 2.0, BNP 55. Broad spectrum antibiotics were started and the patient was admitted for cellulitis having failed outpatient management.  Discharge Diagnoses:  Principal Problem:   Cellulitis of right lower extremity Active Problems:   Polycythemia   Essential hypertension, benign   NICM (nonischemic cardiomyopathy) (HCC)   CKD (chronic kidney disease), stage III  Sepsis due to right lower leg cellulitis:  Fever and leukocytosis on admission. Failed outpatient management. Last HbA1c was 6%, treating impaired glucose tolerance with lifestyle modifications.  - Narrow vancomycin, zosyn to clindamycin 6/4, complete 10 days of therapy (started 6/1). Also prescribed probiotic. No h/o CDiff. - Monitor cultures (NGTD at discharge) - Recheck HbA1c 5.7%.  - Venous duplex negative for DVT   Polycythemia, thrombocytosis: Followed by Dr. Alen Blew. Admitting MD with concern for nonadherence to hydrea.  - Continue hydrea. ?compliance as indices are not macrocytic.   HTN:  - Continue coreg. Was on lisinopril, norvasc, but states he's not taking these.  - Intermittently above goal for age here. If remains elevated, consider titrating dose as outpatient.  AKI on Stage III CKD: SCr 1.65 on admission, and has improved by >0.3 to approximate baseline 1.12 at discharge.  - Continue monitoring.   NICM with chronic HFrEF: Updated echo shows improved LV systolic and diastolic function, EF 62-37%. Last EF 15-20% with negative cath. BNP not elevated, appears compensated.  - I/O, daily weights. Weight at discharge 195lbs. No diuretics.  - Will need cardiology follow up. - With LV dysfunction, would consider low dose ACE/ARB if tolerable. Pt was on lisinopril, but states he stopped taking this. Defer to outpatient setting.    Discharge Instructions Discharge Instructions    Discharge instructions    Complete by:  As directed    You were admitted with cellulitis which has improved with antibiotics. You are stable for discharge with the following recommendations:  - Take clindamycin three times daily for 7 days to treat the infection.  - Take florastor as well to reduce the risk of antibiotic-associated diarrhea. - Call to schedule a follow up with your  PCP within this week for recheck.  - If you have trouble taking the medications, notice fever, worsening rash, or abdominal pain with diarrhea, seek medical attention  right away.     Allergies as of 02/17/2017   No Known Allergies     Medication List    STOP taking these medications   amLODipine 5 MG tablet Commonly known as:  NORVASC   cephALEXin 500 MG capsule Commonly known as:  KEFLEX   furosemide 20 MG tablet Commonly known as:  LASIX   guaiFENesin 600 MG 12 hr tablet Commonly known as:  MUCINEX   lisinopril 5 MG tablet Commonly known as:  PRINIVIL,ZESTRIL   lovastatin 40 MG tablet Commonly known as:  MEVACOR   pantoprazole 40 MG tablet Commonly known as:  PROTONIX   Rivaroxaban 15 & 20 MG Tbpk Commonly known as:  XARELTO STARTER PACK   sulfamethoxazole-trimethoprim 800-160 MG tablet Commonly known as:  BACTRIM DS,SEPTRA DS   tadalafil 20 MG tablet Commonly known as:  CIALIS   traMADol 50 MG tablet Commonly known as:  ULTRAM     TAKE these medications   carvedilol 12.5 MG tablet Commonly known as:  COREG TAKE 1 TABLET (12.5 MG TOTAL) BY MOUTH 2 (TWO) TIMES DAILY WITH A MEAL.   clindamycin 300 MG capsule Commonly known as:  CLEOCIN Take 1 capsule (300 mg total) by mouth 3 (three) times daily.   hydroxyurea 500 MG capsule Commonly known as:  HYDREA TAKE 1 CAPSULE BY MOUTH TWO TIMES DAILY. TAKE WITH FOOD TO MINIMIZE GI SIDE EFFECTS   saccharomyces boulardii 250 MG capsule Commonly known as:  FLORASTOR Take 1 capsule (250 mg total) by mouth 2 (two) times daily.   tamsulosin 0.4 MG Caps capsule Commonly known as:  FLOMAX TAKE 1 CAPSULE (0.4 MG TOTAL) BY MOUTH DAILY.      Follow-up Information    Biagio Borg, MD. Schedule an appointment as soon as possible for a visit in 1 week(s).   Specialties:  Internal Medicine, Radiology Contact information: [address] [address] [phone]          No Known Allergies  Consultations:  None  Procedures/Studies:  Dg Chest 2 View 02/14/2017 Linear atelectasis or scarring at the left lung base.   Echocardiogram 6/2: - Left ventricle:  The cavity size was mildly dilated. Wall thickness was normal. Systolic function was moderately reduced. The estimated ejection fraction was in the range of 35% to 40%. Diffuse hypokinesis. Doppler parameters are consistent with abnormal left ventricular relaxation (grade 1 diastolic dysfunction). - Aortic valve: There was trivial regurgitation. - Left atrium: The atrium was moderately dilated.  Impressions: - Compared to 7564, LV systolic and diastolic function have improved.   LE dopplers 02/15/2017: No evidence of DVT or baker's cyst. +lymphadenopathy.  Subjective: Pt feels well, no fever. Ambulating and eating regularly. Swelling greatly improved and pain is minimal, improved.   Discharge Exam: BP (!) 146/78 (BP Location: Left Arm)   Pulse 78   Temp 97.8 F (36.6 C) (Oral)   Resp 18   Ht 5\' 9"  (1.753 m)   Wt 88.6 kg (195 lb 6.4 oz)   SpO2 99%   BMI 28.86 kg/m   General: Pt is alert, awake, not in acute distress Cardiovascular: RRR, S1/S2 +, no rubs, no gallops Respiratory: CTA bilaterally, no wheezing, no rhonchi Abdominal: Soft, NT, ND, bowel sounds + Skin: Warm erythema along posteromedial left lower leg with induration, extending proximal to  knee and including ankle. Ankle with full, painless, AROM. Proximal margins less intensely erythematous today, and overall margins receding.  No discharge, fluctuance. Left posterior lower calf erythema and induration resolved. +Proximal R > L inguinal lymphadenopathy.   Labs: BNP (last 3 results)  Recent Labs  02/14/17 2121  BNP 54.3   Basic Metabolic Panel:  Recent Labs Lab 02/14/17 1833 02/15/17 0456 02/16/17 0400 02/17/17 0618  NA 133* 138 134* 138  K 4.3 4.2 4.0 4.3  CL 102 103 103 105  CO2 21* 25 23 24   GLUCOSE 151* 92 91 101*  BUN 21* 21* 17 14  CREATININE 1.65* 1.54* 1.27* 1.12  CALCIUM 9.4 9.2 9.2 9.3   Liver Function Tests:  Recent Labs Lab 02/14/17 1833  AST 23  ALT 19  ALKPHOS 87   BILITOT 1.6*  PROT 7.4  ALBUMIN 4.0   CBC:  Recent Labs Lab 02/14/17 1833 02/15/17 0456 02/16/17 0400 02/17/17 0618  WBC 15.4* 15.7* 14.8* 12.6*  NEUTROABS 13.5*  --   --   --   HGB 19.4* 18.2* 17.1* 17.0  HCT 59.6* 56.8* 54.0* 53.5*  MCV 93.6 94.5 94.7 94.9  PLT 580* 491* 502* 454*   CBG:  Recent Labs Lab 02/15/17 2038  GLUCAP 124*   Hgb A1c  Recent Labs  02/16/17 0400  HGBA1C 5.7*   Microbiology Recent Results (from the past 240 hour(s))  Blood Culture (routine x 2)     Status: None (Preliminary result)   Collection Time: 02/14/17  9:12 PM  Result Value Ref Range Status   Specimen Description BLOOD BLOOD RIGHT FOREARM  Final   Special Requests   Final    BOTTLES DRAWN AEROBIC AND ANAEROBIC Blood Culture adequate volume   Culture NO GROWTH 1 DAY  Final   Report Status PENDING  Incomplete  Blood Culture (routine x 2)     Status: None (Preliminary result)   Collection Time: 02/14/17  9:18 PM  Result Value Ref Range Status   Specimen Description BLOOD LEFT ANTECUBITAL  Final   Special Requests   Final    BOTTLES DRAWN AEROBIC AND ANAEROBIC Blood Culture adequate volume   Culture NO GROWTH 1 DAY  Final   Report Status PENDING  Incomplete   Time coordinating discharge: Approximately 40 minutes  Vance Gather, MD  [facility] 02/17/2017, 9:59 AM Pager [phone]

## 2017-02-18 ENCOUNTER — Telehealth: Payer: Self-pay | Admitting: *Deleted

## 2017-02-18 NOTE — Telephone Encounter (Signed)
Transition Care Management Follow-up Telephone Call   Date discharged? 02/17/17   How have you been since you were released from the hospital? Pt states he seem to be doing ok   Do you understand why you were in the hospital? YES   Do you understand the discharge instructions? YES   Where were you discharged to? Home   Items Reviewed:  Medications reviewed: YES  Allergies reviewed: YES  Dietary changes reviewed: YES, heart healthy  Referrals reviewed: No referrals needed   Functional Questionnaire:   Activities of Daily Living (ADLs):   He states he are independent in the following: ambulation, bathing and hygiene, feeding, continence, grooming, toileting and dressing States he doesn't require assistance   Any transportation issues/concerns?: NO   Any patient concerns? NO   Confirmed importance and date/time of follow-up visits scheduled YES, appt 02/27/17  Provider Appointment booked with Dr Jenny Reichmann  Confirmed with patient if condition begins to worsen call PCP or go to the ER.  Patient was given the office number and encouraged to call back with question or concerns.  : YES

## 2017-02-18 NOTE — Consult Note (Signed)
           [facility] CM Primary Care Navigator  02/18/2017  Dillon [name] January 10, 1946 [id number]   Went to see patientat the bedsideearlier today to identify possible discharge needs but he was alreadydischarged.  Patient was discharged home yesterday per staff report.  Noted that primary care provider's office Lorre Nick) made a transition of care call today for post hospital follow-up.   For questions, please contact:  Dannielle Huh, BSN, RN- [facility] Primary Care Navigator  Telephone: [phone] [place]

## 2017-02-20 LAB — CULTURE, BLOOD (ROUTINE X 2)
CULTURE: NO GROWTH
Culture: NO GROWTH
SPECIAL REQUESTS: ADEQUATE
Special Requests: ADEQUATE

## 2017-02-27 ENCOUNTER — Encounter: Payer: Self-pay | Admitting: Internal Medicine

## 2017-02-27 ENCOUNTER — Ambulatory Visit (INDEPENDENT_AMBULATORY_CARE_PROVIDER_SITE_OTHER): Payer: 59 | Admitting: Internal Medicine

## 2017-02-27 VITALS — BP 146/74 | HR 74 | Temp 98.8°F | Resp 16 | Ht 69.0 in | Wt 199.4 lb

## 2017-02-27 DIAGNOSIS — Z0000 Encounter for general adult medical examination without abnormal findings: Secondary | ICD-10-CM | POA: Diagnosis not present

## 2017-02-27 DIAGNOSIS — I428 Other cardiomyopathies: Secondary | ICD-10-CM | POA: Diagnosis not present

## 2017-02-27 DIAGNOSIS — L03115 Cellulitis of right lower limb: Secondary | ICD-10-CM

## 2017-02-27 DIAGNOSIS — R7302 Impaired glucose tolerance (oral): Secondary | ICD-10-CM

## 2017-02-27 DIAGNOSIS — F528 Other sexual dysfunction not due to a substance or known physiological condition: Secondary | ICD-10-CM

## 2017-02-27 MED ORDER — TADALAFIL 20 MG PO TABS: 20.0000 mg | ORAL_TABLET | Freq: Every day | ORAL | 11 refills | Status: DC | PRN

## 2017-02-27 MED ORDER — TADALAFIL 20 MG PO TABS
20.0000 mg | ORAL_TABLET | Freq: Every day | ORAL | 11 refills | Status: DC | PRN
Start: 2017-02-27 — End: 2017-06-04

## 2017-02-27 NOTE — Patient Instructions (Addendum)
Please continue all other medications as before, and refills have been done if requested  - the cialis  Please have the pharmacy call with any other refills you may need.  Please continue your efforts at being more active, low cholesterol diet, and weight control.  Please keep your appointments with your specialists as you may have planned  You will be contacted regarding the referral for: Cardiology  You are given the work note  Please return in 3 months, or sooner if needed, with Lab testing done 3-5 days before

## 2017-02-27 NOTE — Progress Notes (Signed)
Subjective:    Patient ID: Dillon [name], male    DOB: 05/24/1946, 71 y.o.   MRN: [id number]  HPI  Here to f/u recent hospn 6.1 - 6/4 with RLE cellulitis requiring IV antibx, failed outpt tx.  No sepsis.  Tolerated IV meds and improved, then completed oral cleocin post hospn with probiotic.  No worsening red, tender, swelling though the post right calf is Still some swelling and warmish, with scaly rash peeling posteriiorly.   BP at home < 140/90.  Asks for cialis refill, still with ED symptoms. Pt denies chest pain, increased sob or doe, wheezing, orthopnea, PND, increased LE swelling, palpitations, dizziness or syncope, but due for cardiology f/u for NICM, for some reason did not see Dr Curt Bears with last referral done jan 2018.   Pt denies polydipsia, polyuria, Pt denies new neurological symptoms such as new headache, or facial or extremity weakness or numbness Past Medical History:  Diagnosis Date  . Arthritis    right shoulder  . Bladder neck obstruction 04/18/2011  . CKD (chronic kidney disease), stage III 09/12/2015  . ERECTILE DYSFUNCTION 11/15/2008  . Essential hypertension, benign 04/28/2013  . FATIGUE 11/15/2008  . GERD 11/15/2008  . History of adenomatous polyp of colon 05/06/2014   2010  . HYPERLIPIDEMIA 11/15/2008  . Impaired glucose tolerance 04/18/2011  . LEG PAIN, LEFT 11/05/2010  . LVH (left ventricular hypertrophy) 04/27/2012  . MIGRAINE, COMMON 08/06/2010  . Nocturia 12/19/2008  . Polycythemia 04/27/2012  . Swelling of limb 11/05/2010  . THROMBOCYTOSIS 12/19/2008   myleoproliferative disorder   Past Surgical History:  Procedure Laterality Date  . APPENDECTOMY    . CARDIAC CATHETERIZATION N/A 09/05/2015   Procedure: Left Heart Cath and Coronary Angiography;  Surgeon: Troy Sine, MD;  Location: [place] [facility];  Service: Cardiovascular;  Laterality: N/A;  . CIRCUMCISION     30+yrs ago  . COLONOSCOPY    . POLYPECTOMY    . s/p esophageal dilation    . TONSILLECTOMY    .  UPPER GASTROINTESTINAL ENDOSCOPY      reports that he quit smoking about 46 years ago. His smoking use included Cigarettes. He has a 6.00 pack-year smoking history. He has never used smokeless tobacco. He reports that he drinks alcohol. He reports that he does not use drugs. family history includes Cancer in his father; Hypertension in his brother, mother, and sister; Prostate cancer in his father. No Known Allergies Current Outpatient Prescriptions on File Prior to Visit  Medication Sig Dispense Refill  . carvedilol (COREG) 12.5 MG tablet TAKE 1 TABLET (12.5 MG TOTAL) BY MOUTH 2 (TWO) TIMES DAILY WITH A MEAL. 60 tablet 5  . clindamycin (CLEOCIN) 300 MG capsule Take 1 capsule (300 mg total) by mouth 3 (three) times daily. 21 capsule 0  . hydroxyurea (HYDREA) 500 MG capsule TAKE 1 CAPSULE BY MOUTH TWO TIMES DAILY. TAKE WITH FOOD TO MINIMIZE GI SIDE EFFECTS 90 capsule 3  . saccharomyces boulardii (FLORASTOR) 250 MG capsule Take 1 capsule (250 mg total) by mouth 2 (two) times daily. 28 capsule 0  . tamsulosin (FLOMAX) 0.4 MG CAPS capsule TAKE 1 CAPSULE (0.4 MG TOTAL) BY MOUTH DAILY. 90 capsule 0   No current facility-administered medications on file prior to visit.    Review of Systems  Constitutional: Negative for other unusual diaphoresis or sweats HENT: Negative for ear discharge or swelling Eyes: Negative for other worsening visual disturbances Respiratory: Negative for stridor or other swelling  Gastrointestinal: Negative for  worsening distension or other blood Genitourinary: Negative for retention or other urinary change Musculoskeletal: Negative for other MSK pain or swelling Skin: Negative for color change or other new lesions Neurological: Negative for worsening tremors and other numbness  Psychiatric/Behavioral: Negative for worsening agitation or other fatigue All other system neg per pt    Objective:   Physical Exam BP (!) 146/74   Pulse 74   Temp 98.8 F (37.1 C) (Oral)    Resp 16   Ht 5\' 9"  (1.753 m)   Wt 199 lb 6.4 oz (90.4 kg)   SpO2 95%   BMI 29.45 kg/m  VS noted,  Constitutional: Pt appears in NAD HENT: Head: NCAT.  Right Ear: External ear normal.  Left Ear: External ear normal.  Eyes: . Pupils are equal, round, and reactive to light. Conjunctivae and EOM are normal Nose: without d/c or deformity Neck: Neck supple. Gross normal ROM Cardiovascular: Normal rate and regular rhythm.   Pulmonary/Chest: Effort normal and breath sounds without rales or wheezing.  Neurological: Pt is alert. At baseline orientation, motor grossly intact Skin: Skin is warm. No rashes, other new lesions, no LLE edema, though does have persistent mild RLE swelling with minor erythema and induration to post calf, non tender, no ulcer or drainage Psychiatric: Pt behavior is normal without agitation  No other exam findings Lab Results  Component Value Date   WBC 12.6 (H) 02/17/2017   HGB 17.0 02/17/2017   HCT 53.5 (H) 02/17/2017   PLT 454 (H) 02/17/2017   GLUCOSE 101 (H) 02/17/2017   CHOL 147 01/23/2016   TRIG 192.0 (H) 01/23/2016   HDL 52.40 01/23/2016   LDLDIRECT 101.0 10/29/2013   LDLCALC 56 01/23/2016   ALT 19 02/14/2017   AST 23 02/14/2017   NA 138 02/17/2017   K 4.3 02/17/2017   CL 105 02/17/2017   CREATININE 1.12 02/17/2017   BUN 14 02/17/2017   CO2 24 02/17/2017   TSH 2.80 01/23/2016   PSA 0.97 01/23/2016   INR 1.32 09/04/2015   HGBA1C 5.7 (H) 02/16/2017      Assessment & Plan:

## 2017-03-02 NOTE — Assessment & Plan Note (Signed)
Lab Results  Component Value Date   HGBA1C 5.7 (H) 02/16/2017  stable overall by history and exam, recent data reviewed with pt, and pt to continue medical treatment as before,  to f/u any worsening symptoms or concerns

## 2017-03-02 NOTE — Assessment & Plan Note (Signed)
Stable, for cardiology referral as he is due for f/u

## 2017-03-02 NOTE — Assessment & Plan Note (Signed)
Improved, afeb, exam improved though still some swelling persists, would hold further antibx for now, cont to monitor

## 2017-03-02 NOTE — Assessment & Plan Note (Signed)
Ok for cialis prn as reqeusted

## 2017-03-18 ENCOUNTER — Ambulatory Visit (INDEPENDENT_AMBULATORY_CARE_PROVIDER_SITE_OTHER): Payer: 59 | Admitting: Physician Assistant

## 2017-03-18 ENCOUNTER — Encounter: Payer: Self-pay | Admitting: Physician Assistant

## 2017-03-18 VITALS — BP 116/62 | HR 68 | Ht 69.0 in | Wt 195.8 lb

## 2017-03-18 DIAGNOSIS — I5022 Chronic systolic (congestive) heart failure: Secondary | ICD-10-CM | POA: Diagnosis not present

## 2017-03-18 DIAGNOSIS — I471 Supraventricular tachycardia: Secondary | ICD-10-CM | POA: Diagnosis not present

## 2017-03-18 DIAGNOSIS — I10 Essential (primary) hypertension: Secondary | ICD-10-CM

## 2017-03-18 DIAGNOSIS — N289 Disorder of kidney and ureter, unspecified: Secondary | ICD-10-CM

## 2017-03-18 MED ORDER — FUROSEMIDE 20 MG PO TABS: 20.0000 mg | ORAL_TABLET | Freq: Every day | ORAL | 3 refills | Status: DC | PRN

## 2017-03-18 MED ORDER — LISINOPRIL 2.5 MG PO TABS: 2.5000 mg | ORAL_TABLET | Freq: Every day | ORAL | 3 refills | Status: DC

## 2017-03-18 NOTE — Patient Instructions (Addendum)
Medication Instructions:  START LISINOPRIL 2.5MG  DAILY  START FUROSEMIDE 20MG -AS NEED FOR LOWER EXTREMITY SWELLING-OR- >3 POUNDS IN ONE DAY OR  >5 POUNDS IN A WEEK  If you need a refill on your cardiac medications before your next appointment, please call your pharmacy.  Follow-Up: Your physician wants you to follow-up in: [place] will receive a reminder letter in the mail two months in advance. If you don't receive a letter, please call our office OCTOBER to schedule the DECEMBER follow-up appointment.   Special Instructions: WE HAVE SCHEDULED YOU AND APPT WITH DR Cathlean Cower Thursday 03-20-2017 @ 11:15AM. IF FOOT GETS WORSE IN ANY WAY OR YOU FEVER/CHILLS GO DIRECTLY TO THE ER OVER THE HOLIDAY  Thank you for choosing [facility] at [facility]!!

## 2017-03-18 NOTE — Progress Notes (Signed)
Cardiology Office Note   Date:  03/18/2017   ID:  Dillon [name], Dillon [name] January 01, 1946, MRN [id number]  PCP:  Biagio Borg, MD  Cardiologist:  Dr. Claiborne Billings saw in consult in the hospital 09/01/2015 Dillon [name] 09/21/2015  Chief Complaint  Patient presents with  . Follow-up    hosp, Cellulitis of right lower extremity     History of Present Illness: Dillon [name] is a 71 y.o. male with a history of HTN, HLD, JAK2-positive polycythemia on hydrea, thrombocytosis, GERD, former smoker (quit 1972) and LVH w/ EF 60% 2013, CKD III Admitted 12/15-12/21 w/ SVT, S-CHF, NICM w/ EF 15-20% by echo, nl cors at cath 09/21/2015 office visit with frequent bursts of SVT during hospitalization, EP referral recommended but did not happen  Admit 06/01-06/12/2016 with lower extremity cellulitis, Echo with EF improved to 35-40 percent cardiology follow-up recommended, radiology follow-up recommended  Dillon [name] presents for cardiology follow up.   Since d/c from the hospital, he has done pretty well. His R foot has started swelling a few days ago. The previous cellulitis started in his ankle. He has not had fevers or chills. No wound or drainage. Some pain in the area where his shoe presses on it. Elevation helps.   No chest pain. No SOB, denies DOE, orthopnea or PND. He is able to push-mow the yard but has not done so since he was in the hospital.   No LE edema since discharge. He watches the Na in his food. He rinses canned vegetables. He feels he is doing well from a cardiac standpoint. He is compliant with his meds, has not been on a diuretic.    Past Medical History:  Diagnosis Date  . Arthritis    right shoulder  . Bladder neck obstruction 04/18/2011  . CKD (chronic kidney disease), stage III 09/12/2015  . ERECTILE DYSFUNCTION 11/15/2008  . Essential hypertension, benign 04/28/2013  . FATIGUE 11/15/2008  . GERD 11/15/2008  . History of adenomatous polyp of colon 05/06/2014   2010   . HYPERLIPIDEMIA 11/15/2008  . Impaired glucose tolerance 04/18/2011  . LEG PAIN, LEFT 11/05/2010  . LVH (left ventricular hypertrophy) 04/27/2012  . MIGRAINE, COMMON 08/06/2010  . Nocturia 12/19/2008  . Polycythemia 04/27/2012  . Swelling of limb 11/05/2010  . THROMBOCYTOSIS 12/19/2008   myleoproliferative disorder    Past Surgical History:  Procedure Laterality Date  . APPENDECTOMY    . CARDIAC CATHETERIZATION N/A 09/05/2015   Procedure: Left Heart Cath and Coronary Angiography;  Surgeon: Troy Sine, MD;  Location: [place] [facility];  Service: Cardiovascular;  Laterality: N/A;  . CIRCUMCISION     30+yrs ago  . COLONOSCOPY    . POLYPECTOMY    . s/p esophageal dilation    . TONSILLECTOMY    . UPPER GASTROINTESTINAL ENDOSCOPY      Current Outpatient Prescriptions  Medication Sig Dispense Refill  . carvedilol (COREG) 12.5 MG tablet TAKE 1 TABLET (12.5 MG TOTAL) BY MOUTH 2 (TWO) TIMES DAILY WITH A MEAL. 60 tablet 5  . clindamycin (CLEOCIN) 300 MG capsule Take 1 capsule (300 mg total) by mouth 3 (three) times daily. 21 capsule 0  . hydroxyurea (HYDREA) 500 MG capsule TAKE 1 CAPSULE BY MOUTH TWO TIMES DAILY. TAKE WITH FOOD TO MINIMIZE GI SIDE EFFECTS 90 capsule 3  . saccharomyces boulardii (FLORASTOR) 250 MG capsule Take 1 capsule (250 mg total) by mouth 2 (two) times daily. 28 capsule 0  . tadalafil (CIALIS) 20 MG  tablet Take 1 tablet (20 mg total) by mouth daily as needed for erectile dysfunction. 40 tablet 11  . tamsulosin (FLOMAX) 0.4 MG CAPS capsule TAKE 1 CAPSULE (0.4 MG TOTAL) BY MOUTH DAILY. 90 capsule 0   No current facility-administered medications for this visit.     Allergies:   Patient has no known allergies.    Social History:  The patient  reports that he quit smoking about 46 years ago. His smoking use included Cigarettes. He has a 6.00 pack-year smoking history. He has never used smokeless tobacco. He reports that he drinks alcohol. He reports that he does not use  drugs.   Family History:  The patient's family history includes Cancer in his father; Hypertension in his brother, mother, and sister; Prostate cancer in his father.    ROS:  Please see the history of present illness. All other systems are reviewed and negative.    PHYSICAL EXAM: VS:  BP 116/62 (BP Location: Left Arm, Cuff Size: Normal)   Pulse 68   Ht 5\' 9"  (1.753 m)   Wt 195 lb 12.8 oz (88.8 kg)   SpO2 98%   BMI 28.91 kg/m  , BMI Body mass index is 28.91 kg/m. GEN: Well nourished, well developed, male in no acute distress  HEENT: normal for age  Neck: no JVD, no carotid bruit, no masses Cardiac: RRR; no murmur, no rubs, or gallops Respiratory:  clear to auscultation bilaterally, normal work of breathing GI: soft, nontender, nondistended, + BS MS: no deformity or atrophy; 1+ R pedal edema; distal pulses are 2+ in all 4 extremities; R foot/ankle are tender to palpation and movement.  Skin: warm and dry, no rash; 3x3 area of erythema on the top of his foot, shiny. No open wounds.  Neuro:  Strength and sensation are intact Psych: euthymic mood, full affect   EKG:  EKG is not ordered today.   Echocardiogram 02/15/2017: - Left ventricle: The cavity size was mildly dilated. Wall thickness was normal. Systolic function was moderately reduced. The estimated ejection fraction was in the range of 35% to 40%. Diffuse hypokinesis. Doppler parameters are consistent with abnormal left ventricular relaxation (grade 1 diastolicdysfunction). - Aortic valve: There was trivial regurgitation. - Left atrium: The atrium was moderately dilated. Impressions: - Compared to 1941, LV systolic and diastolic function have improved.   Recent Labs: 02/14/2017: ALT 19; B Natriuretic Peptide 55.7 02/17/2017: BUN 14; Creatinine, Ser 1.12; Hemoglobin 17.0; Platelets 454; Potassium 4.3; Sodium 138    Lipid Panel    Component Value Date/Time   CHOL 147 01/23/2016 1723   TRIG 192.0 (H)  01/23/2016 1723   HDL 52.40 01/23/2016 1723   CHOLHDL 3 01/23/2016 1723   VLDL 38.4 01/23/2016 1723   LDLCALC 56 01/23/2016 1723   LDLDIRECT 101.0 10/29/2013 1713     Wt Readings from Last 3 Encounters:  03/18/17 195 lb 12.8 oz (88.8 kg)  02/27/17 199 lb 6.4 oz (90.4 kg)  02/16/17 195 lb 6.4 oz (88.6 kg)     Other studies Reviewed: Additional studies/ records that were reviewed today include: office notes, hospital records and testing.  ASSESSMENT AND PLAN:  1.  Chronic systolic CHF: Pt volume status is at baseline. He is encouraged to continue as he is. Will give him prn Lasix 20 mg with parameters. Will restart lisinopril at 2.5 mg qd.   2. ARI: When pt was admitted to the hospital, BUN/CR were 21/1.65, up from previous 22/1.25. At d/c he was 14/1.12. As the  renal insufficiency was in the setting of acute illness, feel he may tolerate low-dose ACE. Dr Jenny Reichmann has orders in for labs, including a BMET. Will let him follow this.  3. NICM: EF improved at recent check, restart ACE at a low dose, continue BB.  4. SVT: No sx and EF is improved. Continue BB, defer EP referral for now.   5. Polycythemia: H&H normalized when in hospital due to blood draws. Pt used to give blood several times a year. Encouraged him to continue this at some level if it is ok with Dr Jenny Reichmann.    Current medicines are reviewed at length with the patient today.  The patient does not have concerns regarding medicines.  The following changes have been made:  Restart ACE and prn Lasix   Labs/ tests ordered today include:  No orders of the defined types were placed in this encounter.    Disposition:   FU with Dr Claiborne Billings  Signed, Rosaria Ferries, PA-C  03/18/2017 4:48 PM    [place] Phone: [phone]; Fax: [phone]  This note was written with the assistance of speech recognition software. Please excuse any transcriptional errors.

## 2017-03-20 ENCOUNTER — Encounter: Payer: Self-pay | Admitting: Internal Medicine

## 2017-03-20 ENCOUNTER — Ambulatory Visit (INDEPENDENT_AMBULATORY_CARE_PROVIDER_SITE_OTHER): Payer: 59 | Admitting: Internal Medicine

## 2017-03-20 DIAGNOSIS — I10 Essential (primary) hypertension: Secondary | ICD-10-CM

## 2017-03-20 DIAGNOSIS — L03115 Cellulitis of right lower limb: Secondary | ICD-10-CM

## 2017-03-20 DIAGNOSIS — R7302 Impaired glucose tolerance (oral): Secondary | ICD-10-CM

## 2017-03-20 MED ORDER — SULFAMETHOXAZOLE-TRIMETHOPRIM 800-160 MG PO TABS: 1.0000 | ORAL_TABLET | Freq: Two times a day (BID) | ORAL | 0 refills | Status: DC

## 2017-03-20 NOTE — Assessment & Plan Note (Signed)
S/p successful right calf tx, now with recurrent symptoms limited to the right dorsal foot, no ulcers, hx of DM or other significant immune disorder known; no hx of MRSA, no abscess today by exam; for Bactrim DS bid x 10 days, to ED if worsening pain, redness, swelling or drainage

## 2017-03-20 NOTE — Assessment & Plan Note (Signed)
stable overall by history and exam, recent data reviewed with pt, and pt to continue medical treatment as before,  to f/u any worsening symptoms or concerns BP Readings from Last 3 Encounters:  03/20/17 110/72  03/18/17 116/62  02/27/17 (!) 146/74

## 2017-03-20 NOTE — Patient Instructions (Signed)
Please take all new medication as prescribed - the antibiotic  Please continue all other medications as before, and refills have been done if requested.  Please have the pharmacy call with any other refills you may need.  Please keep your appointments with your specialists as you may have planned   

## 2017-03-20 NOTE — Assessment & Plan Note (Signed)
stable overall by history and exam, recent data reviewed with pt, and pt to continue medical treatment as before,  to f/u any worsening symptoms or concerns  

## 2017-03-20 NOTE — Progress Notes (Signed)
Subjective:    Patient ID: Dillon [name], male    DOB: 06/21/1946, 71 y.o.   MRN: [id number]  HPI  Here to f/u after recently seen with LE edema at cardiology 7/3 also noted was erythema of the dorsal right foot with pain, with ? Of cellulitis, and asked pt to present here.  Since that date pt has gotten mildly worse with size of redness, still with pain and swelling, but denies ulcer, drainage, fever, chills, red streaks or feeling of illness.  Just finished cleocin for right calf cellulitis with those symptoms now resolved.  Pt denies chest pain, increased sob or doe, wheezing, orthopnea, PND, palpitations, dizziness or syncope.  Pt denies fever, wt loss, night sweats, loss of appetite, or other constitutional symptoms  Pt denies polydipsia, polyuria Past Medical History:  Diagnosis Date  . Arthritis    right shoulder  . Bladder neck obstruction 04/18/2011  . CKD (chronic kidney disease), stage III 09/12/2015  . ERECTILE DYSFUNCTION 11/15/2008  . Essential hypertension, benign 04/28/2013  . FATIGUE 11/15/2008  . GERD 11/15/2008  . History of adenomatous polyp of colon 05/06/2014   2010  . HYPERLIPIDEMIA 11/15/2008  . Impaired glucose tolerance 04/18/2011  . LEG PAIN, LEFT 11/05/2010  . LVH (left ventricular hypertrophy) 04/27/2012  . MIGRAINE, COMMON 08/06/2010  . Nocturia 12/19/2008  . Polycythemia 04/27/2012  . Swelling of limb 11/05/2010  . THROMBOCYTOSIS 12/19/2008   myleoproliferative disorder   Past Surgical History:  Procedure Laterality Date  . APPENDECTOMY    . CARDIAC CATHETERIZATION N/A 09/05/2015   Procedure: Left Heart Cath and Coronary Angiography;  Surgeon: Troy Sine, MD;  Location: [place] [facility];  Service: Cardiovascular;  Laterality: N/A;  . CIRCUMCISION     30+yrs ago  . COLONOSCOPY    . POLYPECTOMY    . s/p esophageal dilation    . TONSILLECTOMY    . UPPER GASTROINTESTINAL ENDOSCOPY      reports that he quit smoking about 46 years ago. His smoking use included  Cigarettes. He has a 6.00 pack-year smoking history. He has never used smokeless tobacco. He reports that he drinks alcohol. He reports that he does not use drugs. family history includes Cancer in his father; Hypertension in his brother, mother, and sister; Prostate cancer in his father. No Known Allergies Current Outpatient Prescriptions on File Prior to Visit  Medication Sig Dispense Refill  . carvedilol (COREG) 12.5 MG tablet TAKE 1 TABLET (12.5 MG TOTAL) BY MOUTH 2 (TWO) TIMES DAILY WITH A MEAL. 60 tablet 5  . clindamycin (CLEOCIN) 300 MG capsule Take 1 capsule (300 mg total) by mouth 3 (three) times daily. 21 capsule 0  . furosemide (LASIX) 20 MG tablet Take 1 tablet (20 mg total) by mouth daily as needed. 30 tablet 3  . hydroxyurea (HYDREA) 500 MG capsule TAKE 1 CAPSULE BY MOUTH TWO TIMES DAILY. TAKE WITH FOOD TO MINIMIZE GI SIDE EFFECTS 90 capsule 3  . lisinopril (PRINIVIL,ZESTRIL) 2.5 MG tablet Take 1 tablet (2.5 mg total) by mouth daily. 90 tablet 3  . saccharomyces boulardii (FLORASTOR) 250 MG capsule Take 1 capsule (250 mg total) by mouth 2 (two) times daily. 28 capsule 0  . tadalafil (CIALIS) 20 MG tablet Take 1 tablet (20 mg total) by mouth daily as needed for erectile dysfunction. 40 tablet 11  . tamsulosin (FLOMAX) 0.4 MG CAPS capsule TAKE 1 CAPSULE (0.4 MG TOTAL) BY MOUTH DAILY. 90 capsule 0   No current facility-administered medications on file  prior to visit.    Review of Systems  Constitutional: Negative for other unusual diaphoresis or sweats HENT: Negative for ear discharge or swelling Eyes: Negative for other worsening visual disturbances Respiratory: Negative for stridor or other swelling  Gastrointestinal: Negative for worsening distension or other blood Genitourinary: Negative for retention or other urinary change Musculoskeletal: Negative for other MSK pain or swelling Skin: Negative for color change or other new lesions Neurological: Negative for worsening tremors  and other numbness  Psychiatric/Behavioral: Negative for worsening agitation or other fatigue All other system neg per pt    Objective:   Physical Exam BP 110/72   Pulse 64   Ht 5\' 9"  (1.753 m)   Wt 197 lb (89.4 kg)   SpO2 98%   BMI 29.09 kg/m  VS noted, non toxic Constitutional: Pt appears in NAD HENT: Head: NCAT.  Right Ear: External ear normal.  Left Ear: External ear normal.  Eyes: . Pupils are equal, round, and reactive to light. Conjunctivae and EOM are normal Nose: without d/c or deformity Neck: Neck supple. Gross normal ROM Cardiovascular: Normal rate and regular rhythm.   Pulmonary/Chest: Effort normal and breath sounds without rales or wheezing.  Abd:  Soft, NT, ND, + BS, no organomegaly Neurological: Pt is alert. At baseline orientation, motor grossly intact Skin: Skin is warm. No rashes, other new lesions, but has significant whole right dorsal foot erythema, tender, swelling out of proportion to other edema, no red streaks, ulcer or drainage, dorsalis pedis 1+ bilat  Psychiatric: Pt behavior is normal without agitation  No other exam findings  Lab Results  Component Value Date   WBC 12.6 (H) 02/17/2017   HGB 17.0 02/17/2017   HCT 53.5 (H) 02/17/2017   PLT 454 (H) 02/17/2017   GLUCOSE 101 (H) 02/17/2017   CHOL 147 01/23/2016   TRIG 192.0 (H) 01/23/2016   HDL 52.40 01/23/2016   LDLDIRECT 101.0 10/29/2013   LDLCALC 56 01/23/2016   ALT 19 02/14/2017   AST 23 02/14/2017   NA 138 02/17/2017   K 4.3 02/17/2017   CL 105 02/17/2017   CREATININE 1.12 02/17/2017   BUN 14 02/17/2017   CO2 24 02/17/2017   TSH 2.80 01/23/2016   PSA 0.97 01/23/2016   INR 1.32 09/04/2015   HGBA1C 5.7 (H) 02/16/2017      Assessment & Plan:

## 2017-04-28 ENCOUNTER — Encounter: Payer: Self-pay | Admitting: *Deleted

## 2017-04-29 ENCOUNTER — Other Ambulatory Visit: Payer: Self-pay | Admitting: *Deleted

## 2017-04-29 MED ORDER — HYDROXYUREA 500 MG PO CAPS: ORAL_CAPSULE | ORAL | 3 refills | Status: DC

## 2017-05-30 ENCOUNTER — Ambulatory Visit: Payer: Medicare Other | Admitting: Internal Medicine

## 2017-06-04 ENCOUNTER — Encounter: Payer: Self-pay | Admitting: Internal Medicine

## 2017-06-04 ENCOUNTER — Ambulatory Visit (INDEPENDENT_AMBULATORY_CARE_PROVIDER_SITE_OTHER): Payer: 59 | Admitting: Internal Medicine

## 2017-06-04 VITALS — BP 142/90 | HR 65 | Temp 98.4°F | Ht 69.0 in | Wt 195.0 lb

## 2017-06-04 DIAGNOSIS — I10 Essential (primary) hypertension: Secondary | ICD-10-CM

## 2017-06-04 DIAGNOSIS — R7302 Impaired glucose tolerance (oral): Secondary | ICD-10-CM

## 2017-06-04 DIAGNOSIS — N183 Chronic kidney disease, stage 3 unspecified: Secondary | ICD-10-CM

## 2017-06-04 DIAGNOSIS — E785 Hyperlipidemia, unspecified: Secondary | ICD-10-CM

## 2017-06-04 DIAGNOSIS — I428 Other cardiomyopathies: Secondary | ICD-10-CM

## 2017-06-04 MED ORDER — TADALAFIL 20 MG PO TABS: 20.0000 mg | ORAL_TABLET | Freq: Every day | ORAL | 11 refills | Status: DC | PRN

## 2017-06-04 NOTE — Assessment & Plan Note (Signed)
stable overall by history and exam, recent data reviewed with pt, and pt to continue medical treatment as before,  to f/u any worsening symptoms or concerns BP Readings from Last 3 Encounters:  06/04/17 (!) 142/90  03/20/17 110/72  03/18/17 116/62

## 2017-06-04 NOTE — Assessment & Plan Note (Signed)
Lab Results  Component Value Date   LDLCALC 56 01/23/2016  stable overall by history and exam, recent data reviewed with pt, and pt to continue medical treatment as before,  to f/u any worsening symptoms or concerns

## 2017-06-04 NOTE — Patient Instructions (Addendum)
Please continue all other medications as before, and refills have been done if requested - the cialis  Please have the pharmacy call with any other refills you may need.  Please continue your efforts at being more active, low cholesterol diet, and weight control.  Please keep your appointments with your specialists as you may have planned  Please return in 6 months, or sooner if needed

## 2017-06-04 NOTE — Assessment & Plan Note (Signed)
Will ask [facility] to help with cardiology referral as pt states not accomplished after last visit junt 2018

## 2017-06-04 NOTE — Assessment & Plan Note (Signed)
stable overall by history and exam, recent data reviewed with pt, and pt to continue medical treatment as before,  to f/u any worsening symptoms or concerns Lab Results  Component Value Date   HGBA1C 5.7 (H) 02/16/2017

## 2017-06-04 NOTE — Assessment & Plan Note (Signed)
stable overall by history and exam, recent data reviewed with pt, and pt to continue medical treatment as before,  to f/u any worsening symptoms or concerns Lab Results  Component Value Date   CREATININE 1.12 02/17/2017

## 2017-06-04 NOTE — Progress Notes (Signed)
Subjective:    Patient ID: Dillon [name], male    DOB: 1946-01-19, 71 y.o.   MRN: [id number]  HPI  Here to f/u; overall doing ok,  Pt denies chest pain, increasing sob or doe, wheezing, orthopnea, PND, increased LE swelling, palpitations, dizziness or syncope.  Pt denies new neurological symptoms such as new headache, or facial or extremity weakness or numbness.  Pt denies polydipsia, polyuria,  Pt states overall good compliance with meds, mostly trying to follow appropriate diet, with wt overall stable Wt Readings from Last 3 Encounters:  06/04/17 195 lb (88.5 kg)  03/20/17 197 lb (89.4 kg)  03/18/17 195 lb 12.8 oz (88.8 kg)  For some reason was not notified or contacted for cardiology referral per pt Past Medical History:  Diagnosis Date  . Arthritis    right shoulder  . Bladder neck obstruction 04/18/2011  . CKD (chronic kidney disease), stage III 09/12/2015  . ERECTILE DYSFUNCTION 11/15/2008  . Essential hypertension, benign 04/28/2013  . FATIGUE 11/15/2008  . GERD 11/15/2008  . History of adenomatous polyp of colon 05/06/2014   2010  . HYPERLIPIDEMIA 11/15/2008  . Impaired glucose tolerance 04/18/2011  . LEG PAIN, LEFT 11/05/2010  . LVH (left ventricular hypertrophy) 04/27/2012  . MIGRAINE, COMMON 08/06/2010  . Nocturia 12/19/2008  . Polycythemia 04/27/2012  . Swelling of limb 11/05/2010  . THROMBOCYTOSIS 12/19/2008   myleoproliferative disorder   Past Surgical History:  Procedure Laterality Date  . APPENDECTOMY    . CARDIAC CATHETERIZATION N/A 09/05/2015   Procedure: Left Heart Cath and Coronary Angiography;  Surgeon: Troy Sine, MD;  Location: [facility];  Service: Cardiovascular;  Laterality: N/A;  . CIRCUMCISION     30+yrs ago  . COLONOSCOPY    . POLYPECTOMY    . s/p esophageal dilation    . TONSILLECTOMY    . UPPER GASTROINTESTINAL ENDOSCOPY      reports that he quit smoking about 46 years ago. His smoking use included Cigarettes. He has a 6.00 pack-year smoking  history. He has never used smokeless tobacco. He reports that he drinks alcohol. He reports that he does not use drugs. family history includes Cancer in his father; Hypertension in his brother, mother, and sister; Prostate cancer in his father. No Known Allergies Current Outpatient Prescriptions on File Prior to Visit  Medication Sig Dispense Refill  . carvedilol (COREG) 12.5 MG tablet TAKE 1 TABLET (12.5 MG TOTAL) BY MOUTH 2 (TWO) TIMES DAILY WITH A MEAL. 60 tablet 5  . clindamycin (CLEOCIN) 300 MG capsule Take 1 capsule (300 mg total) by mouth 3 (three) times daily. 21 capsule 0  . furosemide (LASIX) 20 MG tablet Take 1 tablet (20 mg total) by mouth daily as needed. 30 tablet 3  . hydroxyurea (HYDREA) 500 MG capsule TAKE 1 CAPSULE BY MOUTH TWO TIMES DAILY. TAKE WITH FOOD TO MINIMIZE GI SIDE EFFECTS 90 capsule 3  . lisinopril (PRINIVIL,ZESTRIL) 2.5 MG tablet Take 1 tablet (2.5 mg total) by mouth daily. 90 tablet 3  . saccharomyces boulardii (FLORASTOR) 250 MG capsule Take 1 capsule (250 mg total) by mouth 2 (two) times daily. 28 capsule 0  . sulfamethoxazole-trimethoprim (BACTRIM DS,SEPTRA DS) 800-160 MG tablet Take 1 tablet by mouth 2 (two) times daily. 20 tablet 0  . tamsulosin (FLOMAX) 0.4 MG CAPS capsule TAKE 1 CAPSULE (0.4 MG TOTAL) BY MOUTH DAILY. 90 capsule 0   No current facility-administered medications on file prior to visit.    Review of Systems  Constitutional: Negative for other unusual diaphoresis or sweats HENT: Negative for ear discharge or swelling Eyes: Negative for other worsening visual disturbances Respiratory: Negative for stridor or other swelling  Gastrointestinal: Negative for worsening distension or other blood Genitourinary: Negative for retention or other urinary change Musculoskeletal: Negative for other MSK pain or swelling Skin: Negative for color change or other new lesions Neurological: Negative for worsening tremors and other numbness    Psychiatric/Behavioral: Negative for worsening agitation or other fatigue All other system neg per pt    Objective:   Physical Exam BP (!) 142/90   Pulse 65   Temp 98.4 F (36.9 C) (Oral)   Ht 5\' 9"  (1.753 m)   Wt 195 lb (88.5 kg)   SpO2 98%   BMI 28.80 kg/m  VS noted,  Constitutional: Pt appears in NAD HENT: Head: NCAT.  Right Ear: External ear normal.  Left Ear: External ear normal.  Eyes: . Pupils are equal, round, and reactive to light. Conjunctivae and EOM are normal Nose: without d/c or deformity Neck: Neck supple. Gross normal ROM Cardiovascular: Normal rate and regular rhythm.   Pulmonary/Chest: Effort normal and breath sounds without rales or wheezing.  Neurological: Pt is alert. At baseline orientation, motor grossly intact Skin: Skin is warm. No rashes, other new lesions, has trace bilat LE edema Psychiatric: Pt behavior is normal without agitation  No other exam findings    Assessment & Plan:

## 2017-07-02 ENCOUNTER — Telehealth: Payer: Self-pay | Admitting: Internal Medicine

## 2017-07-02 MED ORDER — LISINOPRIL 2.5 MG PO TABS: 2.5000 mg | ORAL_TABLET | Freq: Every day | ORAL | 3 refills | Status: DC

## 2017-07-02 NOTE — Telephone Encounter (Signed)
Patient needs a refill on his BP Meds. Patient uses the [facility] on [address]. Please advise.

## 2017-07-14 ENCOUNTER — Other Ambulatory Visit: Payer: Self-pay | Admitting: Physician Assistant

## 2017-07-14 NOTE — Telephone Encounter (Signed)
REFILL 

## 2017-08-21 ENCOUNTER — Other Ambulatory Visit: Payer: Self-pay | Admitting: Internal Medicine

## 2017-09-02 ENCOUNTER — Telehealth: Payer: Self-pay | Admitting: Internal Medicine

## 2017-09-02 MED ORDER — TAMSULOSIN HCL 0.4 MG PO CAPS: ORAL_CAPSULE | ORAL | 2 refills | Status: DC

## 2017-09-02 NOTE — Telephone Encounter (Signed)
Per chart Tamulosin has not been refilled since 7/2017pls advise on refill.,,,/lmb

## 2017-09-02 NOTE — Telephone Encounter (Signed)
Pt came in to office for a refill of his prostate medication,  Not sure what this medication is  Please advise  POF

## 2017-09-02 NOTE — Telephone Encounter (Signed)
Done erx 

## 2017-09-02 NOTE — Telephone Encounter (Signed)
This is done.

## 2017-09-03 NOTE — Telephone Encounter (Signed)
[facility].../lmb 

## 2017-11-20 ENCOUNTER — Other Ambulatory Visit: Payer: Self-pay | Admitting: Internal Medicine

## 2017-12-02 ENCOUNTER — Ambulatory Visit: Payer: Medicare Other | Admitting: Internal Medicine

## 2017-12-15 ENCOUNTER — Encounter: Payer: Self-pay | Admitting: Internal Medicine

## 2017-12-15 ENCOUNTER — Other Ambulatory Visit (INDEPENDENT_AMBULATORY_CARE_PROVIDER_SITE_OTHER): Payer: 59

## 2017-12-15 ENCOUNTER — Ambulatory Visit (INDEPENDENT_AMBULATORY_CARE_PROVIDER_SITE_OTHER): Payer: 59 | Admitting: Internal Medicine

## 2017-12-15 VITALS — BP 116/78 | HR 95 | Temp 98.6°F | Ht 69.0 in | Wt 194.0 lb

## 2017-12-15 DIAGNOSIS — D473 Essential (hemorrhagic) thrombocythemia: Secondary | ICD-10-CM | POA: Diagnosis not present

## 2017-12-15 DIAGNOSIS — R7302 Impaired glucose tolerance (oral): Secondary | ICD-10-CM | POA: Diagnosis not present

## 2017-12-15 DIAGNOSIS — D751 Secondary polycythemia: Secondary | ICD-10-CM | POA: Diagnosis not present

## 2017-12-15 DIAGNOSIS — Z0000 Encounter for general adult medical examination without abnormal findings: Secondary | ICD-10-CM

## 2017-12-15 DIAGNOSIS — I10 Essential (primary) hypertension: Secondary | ICD-10-CM

## 2017-12-15 DIAGNOSIS — D75839 Thrombocytosis, unspecified: Secondary | ICD-10-CM

## 2017-12-15 LAB — BASIC METABOLIC PANEL
BUN: 14 mg/dL (ref 6–23)
CHLORIDE: 100 meq/L (ref 96–112)
CO2: 33 mEq/L — ABNORMAL HIGH (ref 19–32)
Calcium: 9.6 mg/dL (ref 8.4–10.5)
Creatinine, Ser: 1.11 mg/dL (ref 0.40–1.50)
GFR: 83.24 mL/min (ref >60.00)
GLUCOSE: 97 mg/dL (ref 70–99)
Potassium: 4 mEq/L (ref 3.5–5.1)
Sodium: 140 mEq/L (ref 135–145)

## 2017-12-15 LAB — CBC WITH DIFFERENTIAL/PLATELET
BASOS ABS: 0.1 10*3/uL (ref 0.0–0.1)
Basophils Relative: 0.5 % (ref 0.0–3.0)
EOS ABS: 0.3 10*3/uL (ref 0.0–0.7)
Eosinophils Relative: 2.4 % (ref 0.0–5.0)
HEMATOCRIT: 53.6 % — AB (ref 39.0–52.0)
HEMOGLOBIN: 16.6 g/dL (ref 13.0–17.0)
LYMPHS PCT: 12.5 % (ref 12.0–46.0)
Lymphs Abs: 1.8 10*3/uL (ref 0.7–4.0)
MCHC: 31.1 g/dL (ref 30.0–36.0)
MCV: 102.4 fl — ABNORMAL HIGH (ref 78.0–100.0)
Monocytes Absolute: 0.8 10*3/uL (ref 0.1–1.0)
Monocytes Relative: 5.9 % (ref 3.0–12.0)
Neutro Abs: 11.1 10*3/uL — ABNORMAL HIGH (ref 1.4–7.7)
Neutrophils Relative %: 78.7 % — ABNORMAL HIGH (ref 43.0–77.0)
PLATELETS: 668 10*3/uL — AB (ref 150.0–400.0)
RBC: 5.23 Mil/uL (ref 4.22–5.81)
RDW: 14.4 % (ref 11.5–15.5)
WBC: 14 10*3/uL — ABNORMAL HIGH (ref 4.0–10.5)

## 2017-12-15 LAB — URINALYSIS, ROUTINE W REFLEX MICROSCOPIC
HGB URINE DIPSTICK: NEGATIVE
Ketones, ur: NEGATIVE
LEUKOCYTES UA: NEGATIVE
NITRITE: NEGATIVE
RBC / HPF: NONE SEEN (ref 0–?)
Specific Gravity, Urine: >=1.03 — AB (ref 1.000–1.030)
Total Protein, Urine: NEGATIVE
Urine Glucose: NEGATIVE
Urobilinogen, UA: 0.2 (ref 0.0–1.0)
pH: 5.5 (ref 5.0–8.0)

## 2017-12-15 LAB — PSA: PSA: 1.92 ng/mL (ref 0.10–4.00)

## 2017-12-15 LAB — LIPID PANEL
CHOL/HDL RATIO: 3
CHOLESTEROL: 123 mg/dL (ref 0–200)
HDL: 42 mg/dL (ref >39.00)
NonHDL: 80.65
Triglycerides: 230 mg/dL — ABNORMAL HIGH (ref 0.0–149.0)
VLDL: 46 mg/dL — ABNORMAL HIGH (ref 0.0–40.0)

## 2017-12-15 LAB — HEPATIC FUNCTION PANEL
ALK PHOS: 70 U/L (ref 39–117)
ALT: 13 U/L (ref 0–53)
AST: 16 U/L (ref 0–37)
Albumin: 4.2 g/dL (ref 3.5–5.2)
BILIRUBIN DIRECT: 0.2 mg/dL (ref 0.0–0.3)
TOTAL PROTEIN: 7.1 g/dL (ref 6.0–8.3)
Total Bilirubin: 1.1 mg/dL (ref 0.2–1.2)

## 2017-12-15 LAB — HEMOGLOBIN A1C: HEMOGLOBIN A1C: 5.4 % (ref 4.6–6.5)

## 2017-12-15 LAB — LDL CHOLESTEROL, DIRECT: LDL DIRECT: 57 mg/dL

## 2017-12-15 LAB — TSH: TSH: 3.36 u[IU]/mL (ref 0.35–4.50)

## 2017-12-15 MED ORDER — TADALAFIL 20 MG PO TABS: 20.0000 mg | ORAL_TABLET | Freq: Every day | ORAL | 11 refills | Status: DC | PRN

## 2017-12-15 NOTE — Assessment & Plan Note (Signed)
Lab Results  Component Value Date   HGBA1C 5.4 12/15/2017  stable overall by history and exam, recent data reviewed with pt, and pt to continue medical treatment as before,  to f/u any worsening symptoms or concerns

## 2017-12-15 NOTE — Progress Notes (Signed)
Subjective:    Patient ID: Dillon [name], male    DOB: 07/09/1946, 72 y.o.   MRN: [id number]  HPI  Here for wellness and f/u;  Overall doing ok;  Pt denies Chest pain, worsening SOB, DOE, wheezing, orthopnea, PND, worsening LE edema, palpitations, dizziness or syncope.  Pt denies neurological change such as new headache, facial or extremity weakness.  Pt denies polydipsia, polyuria, or low sugar symptoms. Pt states overall good compliance with treatment and medications, good tolerability, and has been trying to follow appropriate diet.  Pt denies worsening depressive symptoms, suicidal ideation or panic. No fever, night sweats, wt loss, loss of appetite, or other constitutional symptoms.  Pt states good ability with ADL's, has low fall risk, home safety reviewed and adequate, no other significant changes in hearing or vision, and only occasionally active with exercise. Sister died last wk with lung cancer.  No other interval change or new complaint Past Medical History:  Diagnosis Date  . Arthritis    right shoulder  . Bladder neck obstruction 04/18/2011  . CKD (chronic kidney disease), stage III ([place]) 09/12/2015  . ERECTILE DYSFUNCTION 11/15/2008  . Essential hypertension, benign 04/28/2013  . FATIGUE 11/15/2008  . GERD 11/15/2008  . History of adenomatous polyp of colon 05/06/2014   2010  . HYPERLIPIDEMIA 11/15/2008  . Impaired glucose tolerance 04/18/2011  . LEG PAIN, LEFT 11/05/2010  . LVH (left ventricular hypertrophy) 04/27/2012  . MIGRAINE, COMMON 08/06/2010  . Nocturia 12/19/2008  . Polycythemia 04/27/2012  . Swelling of limb 11/05/2010  . THROMBOCYTOSIS 12/19/2008   myleoproliferative disorder   Past Surgical History:  Procedure Laterality Date  . APPENDECTOMY    . CARDIAC CATHETERIZATION N/A 09/05/2015   Procedure: Left Heart Cath and Coronary Angiography;  Surgeon: Troy Sine, MD;  Location: [facility];  Service: Cardiovascular;  Laterality: N/A;  . CIRCUMCISION     30+yrs  ago  . COLONOSCOPY    . POLYPECTOMY    . s/p esophageal dilation    . TONSILLECTOMY    . UPPER GASTROINTESTINAL ENDOSCOPY      reports that he quit smoking about 47 years ago. His smoking use included cigarettes. He has a 6.00 pack-year smoking history. He has never used smokeless tobacco. He reports that he drinks alcohol. He reports that he does not use drugs. family history includes Cancer in his father; Hypertension in his brother, mother, and sister; Prostate cancer in his father. No Known Allergies Current Outpatient Medications on File Prior to Visit  Medication Sig Dispense Refill  . carvedilol (COREG) 12.5 MG tablet TAKE 1 TABLET (12.5 MG TOTAL) BY MOUTH 2 (TWO) TIMES DAILY WITH A MEAL. 60 tablet 5  . clindamycin (CLEOCIN) 300 MG capsule Take 1 capsule (300 mg total) by mouth 3 (three) times daily. 21 capsule 0  . hydroxyurea (HYDREA) 500 MG capsule TAKE 1 CAPSULE BY MOUTH TWO TIMES DAILY. TAKE WITH FOOD TO MINIMIZE GI SIDE EFFECTS 90 capsule 3  . lovastatin (MEVACOR) 40 MG tablet TAKE 1 TABLET (40 MG TOTAL) BY MOUTH DAILY. 90 tablet 0  . saccharomyces boulardii (FLORASTOR) 250 MG capsule Take 1 capsule (250 mg total) by mouth 2 (two) times daily. 28 capsule 0  . sulfamethoxazole-trimethoprim (BACTRIM DS,SEPTRA DS) 800-160 MG tablet Take 1 tablet by mouth 2 (two) times daily. 20 tablet 0  . tamsulosin (FLOMAX) 0.4 MG CAPS capsule TAKE 1 CAPSULE (0.4 MG TOTAL) BY MOUTH DAILY. 90 capsule 2  . furosemide (LASIX) 20 MG tablet  Take 1 tablet (20 mg total) by mouth daily as needed. 90 tablet 1  . lisinopril (PRINIVIL,ZESTRIL) 2.5 MG tablet Take 1 tablet (2.5 mg total) by mouth daily. 90 tablet 3   No current facility-administered medications on file prior to visit.    Review of Systems  Constitutional: Negative for other unusual diaphoresis or sweats HENT: Negative for ear discharge or swelling Eyes: Negative for other worsening visual disturbances Respiratory: Negative for stridor or  other swelling  Gastrointestinal: Negative for worsening distension or other blood Genitourinary: Negative for retention or other urinary change Musculoskeletal: Negative for other MSK pain or swelling Skin: Negative for color change or other new lesions Neurological: Negative for worsening tremors and other numbness  Psychiatric/Behavioral: Negative for worsening agitation or other fatigue All other system neg per pt    Objective:   Physical Exam BP 116/78   Pulse 95   Temp 98.6 F (37 C) (Oral)   Ht 5\' 9"  (1.753 m)   Wt 194 lb (88 kg)   SpO2 97%   BMI 28.65 kg/m  VS noted, not ill appearing Constitutional: Pt appears in NAD HENT: Head: NCAT.  Right Ear: External ear normal.  Left Ear: External ear normal.  Eyes: . Pupils are equal, round, and reactive to light. Conjunctivae and EOM are normal Nose: without d/c or deformity Neck: Neck supple. Gross normal ROM Cardiovascular: Normal rate and regular rhythm.   Pulmonary/Chest: Effort normal and breath sounds without rales or wheezing.  Abd:  Soft, NT, ND, + BS, no organomegaly Neurological: Pt is alert. At baseline orientation, motor grossly intact Skin: Skin is warm. No rashes, other new lesions, no LE edema Psychiatric: Pt behavior is normal without agitation , mild nervous No other exam findings     Assessment & Plan:

## 2017-12-15 NOTE — Patient Instructions (Signed)

## 2017-12-15 NOTE — Assessment & Plan Note (Signed)

## 2017-12-15 NOTE — Assessment & Plan Note (Signed)
For f/u lab today 

## 2017-12-15 NOTE — Assessment & Plan Note (Signed)
Has been non compliant with hydroxyurea and heme f/u since 09/2015, for f/u lab today

## 2017-12-15 NOTE — Assessment & Plan Note (Signed)
stable overall by history and exam, recent data reviewed with pt, and pt to continue medical treatment as before,  to f/u any worsening symptoms or concerns BP Readings from Last 3 Encounters:  12/15/17 116/78  06/04/17 (!) 142/90  03/20/17 110/72

## 2018-01-03 ENCOUNTER — Other Ambulatory Visit: Payer: Self-pay | Admitting: Physician Assistant

## 2018-03-19 ENCOUNTER — Other Ambulatory Visit: Payer: Self-pay | Admitting: Internal Medicine

## 2018-06-16 ENCOUNTER — Ambulatory Visit (INDEPENDENT_AMBULATORY_CARE_PROVIDER_SITE_OTHER): Payer: 59 | Admitting: Family

## 2018-06-16 ENCOUNTER — Ambulatory Visit: Payer: Self-pay | Admitting: Internal Medicine

## 2018-06-16 ENCOUNTER — Encounter: Payer: Self-pay | Admitting: Family

## 2018-06-16 ENCOUNTER — Other Ambulatory Visit (INDEPENDENT_AMBULATORY_CARE_PROVIDER_SITE_OTHER): Payer: 59

## 2018-06-16 ENCOUNTER — Telehealth: Payer: Self-pay | Admitting: Emergency Medicine

## 2018-06-16 VITALS — BP 140/80 | HR 78 | Temp 98.4°F | Ht 69.0 in | Wt 180.1 lb

## 2018-06-16 DIAGNOSIS — Z23 Encounter for immunization: Secondary | ICD-10-CM | POA: Diagnosis not present

## 2018-06-16 DIAGNOSIS — R42 Dizziness and giddiness: Secondary | ICD-10-CM

## 2018-06-16 LAB — CBC WITH DIFFERENTIAL/PLATELET
BASOS ABS: 0 10*3/uL (ref 0.0–0.1)
Basophils Relative: 0.4 % (ref 0.0–3.0)
EOS ABS: 0.3 10*3/uL (ref 0.0–0.7)
Eosinophils Relative: 2.3 % (ref 0.0–5.0)
LYMPHS PCT: 12 % (ref 12.0–46.0)
Lymphs Abs: 1.4 10*3/uL (ref 0.7–4.0)
MCHC: 31.5 g/dL (ref 30.0–36.0)
MCV: 92.2 fl (ref 78.0–100.0)
Monocytes Absolute: 0.7 10*3/uL (ref 0.1–1.0)
Monocytes Relative: 5.8 % (ref 3.0–12.0)
Neutro Abs: 9 10*3/uL — ABNORMAL HIGH (ref 1.4–7.7)
Neutrophils Relative %: 79.5 % — ABNORMAL HIGH (ref 43.0–77.0)
Platelets: 473 10*3/uL — ABNORMAL HIGH (ref 150.0–400.0)
RBC: 6.78 Mil/uL — AB (ref 4.22–5.81)
RDW: 17 % — ABNORMAL HIGH (ref 11.5–15.5)
WBC: 11.3 10*3/uL — AB (ref 4.0–10.5)

## 2018-06-16 LAB — COMPREHENSIVE METABOLIC PANEL
ALBUMIN: 4.4 g/dL (ref 3.5–5.2)
ALT: 19 U/L (ref 0–53)
AST: 19 U/L (ref 0–37)
Alkaline Phosphatase: 74 U/L (ref 39–117)
BILIRUBIN TOTAL: 1 mg/dL (ref 0.2–1.2)
BUN: 24 mg/dL — ABNORMAL HIGH (ref 6–23)
CALCIUM: 10.3 mg/dL (ref 8.4–10.5)
CO2: 26 mEq/L (ref 19–32)
CREATININE: 1.38 mg/dL (ref 0.40–1.50)
Chloride: 103 mEq/L (ref 96–112)
GFR: 64.65 mL/min (ref >60.00)
Glucose, Bld: 106 mg/dL — ABNORMAL HIGH (ref 70–99)
Potassium: 4 mEq/L (ref 3.5–5.1)
Sodium: 139 mEq/L (ref 135–145)
Total Protein: 7.1 g/dL (ref 6.0–8.3)

## 2018-06-16 MED ORDER — FLUTICASONE PROPIONATE 50 MCG/ACT NA SUSP: 2.0000 | Freq: Every day | NASAL | 6 refills | Status: DC

## 2018-06-16 NOTE — Patient Instructions (Signed)
Eustachian Tube Dysfunction The eustachian tube connects the middle ear to the back of the nose. It regulates air pressure in the middle ear by allowing air to move between the ear and nose. It also helps to drain fluid from the middle ear space. When the eustachian tube does not function properly, air pressure, fluid, or both can build up in the middle ear. Eustachian tube dysfunction can affect one or both ears. What are the causes? This condition happens when the eustachian tube becomes blocked or cannot open normally. This may result from:  Ear infections.  Colds and other upper respiratory infections.  Allergies.  Irritation, such as from cigarette smoke or acid from the stomach coming up into the esophagus (gastroesophageal reflux).  Sudden changes in air pressure, such as from descending in an airplane.  Abnormal growths in the nose or throat, such as nasal polyps, tumors, or enlarged tissue at the back of the throat (adenoids).  What increases the risk? This condition may be more likely to develop in people who smoke and people who are overweight. Eustachian tube dysfunction may also be more likely to develop in children, especially children who have:  Certain birth defects of the mouth, such as cleft palate.  Large tonsils and adenoids.  What are the signs or symptoms? Symptoms of this condition may include:  A feeling of fullness in the ear.  Ear pain.  Clicking or popping noises in the ear.  Ringing in the ear.  Hearing loss.  Loss of balance.  Symptoms may get worse when the air pressure around you changes, such as when you travel to an area of high elevation or fly on an airplane. How is this diagnosed? This condition may be diagnosed based on:  Your symptoms.  A physical exam of your ear, nose, and throat.  Tests, such as those that measure: ? The movement of your eardrum (tympanogram). ? Your hearing (audiometry).  How is this treated? Treatment  depends on the cause and severity of your condition. If your symptoms are mild, you may be able to relieve your symptoms by moving air into ("popping") your ears. If you have symptoms of fluid in your ears, treatment may include:  Decongestants.  Antihistamines.  Nasal sprays or ear drops that contain medicines that reduce swelling (steroids).  In some cases, you may need to have a procedure to drain the fluid in your eardrum (myringotomy). In this procedure, a small tube is placed in the eardrum to:  Drain the fluid.  Restore the air in the middle ear space.  Follow these instructions at home:  Take over-the-counter and prescription medicines only as told by your health care provider.  Use techniques to help pop your ears as recommended by your health care provider. These may include: ? Chewing gum. ? Yawning. ? Frequent, forceful swallowing. ? Closing your mouth, holding your nose closed, and gently blowing as if you are trying to blow air out of your nose.  Do not do any of the following until your health care provider approves: ? Travel to high altitudes. ? Fly in airplanes. ? Work in a pressurized cabin or room. ? Scuba dive.  Keep your ears dry. Dry your ears completely after showering or bathing.  Do not smoke.  Keep all follow-up visits as told by your health care provider. This is important. Contact a health care provider if:  Your symptoms do not go away after treatment.  Your symptoms come back after treatment.  You are   unable to pop your ears.  You have: ? A fever. ? Pain in your ear. ? Pain in your head or neck. ? Fluid draining from your ear.  Your hearing suddenly changes.  You become very dizzy.  You lose your balance. This information is not intended to replace advice given to you by your health care provider. Make sure you discuss any questions you have with your health care provider. Document Released: 09/29/2015 Document Revised: 02/08/2016  Document Reviewed: 09/21/2014 Elsevier Interactive Patient Education  2018 [employer]  

## 2018-06-16 NOTE — Telephone Encounter (Signed)
Appointment made with Laura Murray. °

## 2018-06-16 NOTE — Telephone Encounter (Signed)
Patient is calling to report he has been having dizziness with bending over- he feels this is related to his BP. He states it has been elevated recently. Patient given appointment for evaluation Reason for Disposition . [1] MILD dizziness (e.g., vertigo; walking normally) AND [2] has NOT been evaluated by physician for this  Answer Assessment - Initial Assessment Questions 1. DESCRIPTION: "Describe your dizziness."     Patient gets dizzy when he bends over and when he lays down- patient feels that his BP is elevated and that is effecting the dizziness- 157/92 this morning 2. VERTIGO: "Do you feel like either you or the room is spinning or tilting?"      no 3. LIGHTHEADED: "Do you feel lightheaded?" (e.g., somewhat faint, woozy, weak upon standing)     Woozy- for a few seconds 4. SEVERITY: "How bad is it?"  "Can you walk?"   - MILD - Feels unsteady but walking normally.   - MODERATE - Feels very unsteady when walking, but not falling; interferes with normal activities (e.g., school, work) .   - SEVERE - Unable to walk without falling (requires assistance).     mild 5. ONSET:  "When did the dizziness begin?"     2-3 days 6. AGGRAVATING FACTORS: "Does anything make it worse?" (e.g., standing, change in head position)     Bending over 7. CAUSE: "What do you think is causing the dizziness?"     Patient thinks it is related to BP 8. RECURRENT SYMPTOM: "Have you had dizziness before?" If so, ask: "When was the last time?" "What happened that time?"     No- it has not happened before 9. OTHER SYMPTOMS: "Do you have any other symptoms?" (e.g., headache, weakness, numbness, vomiting, earache)     no 10. PREGNANCY: "Is there any chance you are pregnant?" "When was your last menstrual period?"       n/a  Protocols used: DIZZINESS - VERTIGO-A-AH

## 2018-06-16 NOTE — Progress Notes (Signed)
Dillon [name] is a 72 y.o. male with the following history as recorded in Epic[employer]:  Patient Active Problem List   Diagnosis Date Noted  . Cellulitis of right foot 02/14/2017  . Right leg swelling 02/05/2017  . Other bursitis of elbow, left elbow 01/28/2016  . CKD (chronic kidney disease), stage III ([place]) 09/12/2015  . NICM (nonischemic cardiomyopathy) ([place])   . Acute systolic HF (heart failure) ([place])   . CAP (community acquired pneumonia) 09/01/2015  . History of adenomatous polyp of colon 05/06/2014  . Tooth pain 10/29/2013  . Essential hypertension, benign 04/28/2013  . Polycythemia 04/27/2012  . LVH (left ventricular hypertrophy) 04/27/2012  . Fatigue 04/27/2012  . Left knee pain 05/27/2011  . Impaired glucose tolerance 04/18/2011  . Bladder neck obstruction 04/18/2011  . Skin lesion 04/18/2011  . Preventative health care 04/17/2011  . LEG PAIN, LEFT 11/05/2010  . MIGRAINE, COMMON 08/06/2010  . Thrombocytosis ([place]) 12/19/2008  . Nocturia 12/19/2008  . Hyperlipidemia 11/15/2008  . ERECTILE DYSFUNCTION 11/15/2008  . GERD 11/15/2008    Current Outpatient Medications  Medication Sig Dispense Refill  . carvedilol (COREG) 12.5 MG tablet TAKE 1 TABLET (12.5 MG TOTAL) BY MOUTH 2 (TWO) TIMES DAILY WITH A MEAL. 60 tablet 5  . furosemide (LASIX) 20 MG tablet TAKE 1 TABLET BY MOUTH EVERY DAY AS NEEDED 90 tablet 1  . lisinopril (PRINIVIL,ZESTRIL) 2.5 MG tablet Take 2.5 mg by mouth daily.  3  . lovastatin (MEVACOR) 40 MG tablet TAKE 1 TABLET (40 MG TOTAL) BY MOUTH DAILY. 30 tablet 2  . tamsulosin (FLOMAX) 0.4 MG CAPS capsule TAKE 1 CAPSULE (0.4 MG TOTAL) BY MOUTH DAILY. 90 capsule 2  . fluticasone (FLONASE) 50 MCG/ACT nasal spray Place 2 sprays into both nostrils daily. 16 g 6  . hydroxyurea (HYDREA) 500 MG capsule TAKE 1 CAPSULE BY MOUTH TWO TIMES DAILY. TAKE WITH FOOD TO MINIMIZE GI SIDE EFFECTS (Patient not taking: Reported on 06/16/2018) 90 capsule 3  . tadalafil (CIALIS) 20 MG  tablet Take 1 tablet (20 mg total) by mouth daily as needed for erectile dysfunction. 40 tablet 11   No current facility-administered medications for this visit.     Allergies: Patient has no known allergies.  Past Medical History:  Diagnosis Date  . Arthritis    right shoulder  . Bladder neck obstruction 04/18/2011  . CKD (chronic kidney disease), stage III ([place]) 09/12/2015  . ERECTILE DYSFUNCTION 11/15/2008  . Essential hypertension, benign 04/28/2013  . FATIGUE 11/15/2008  . GERD 11/15/2008  . History of adenomatous polyp of colon 05/06/2014   2010  . HYPERLIPIDEMIA 11/15/2008  . Impaired glucose tolerance 04/18/2011  . LEG PAIN, LEFT 11/05/2010  . LVH (left ventricular hypertrophy) 04/27/2012  . MIGRAINE, COMMON 08/06/2010  . Nocturia 12/19/2008  . Polycythemia 04/27/2012  . Swelling of limb 11/05/2010  . THROMBOCYTOSIS 12/19/2008   myleoproliferative disorder    Past Surgical History:  Procedure Laterality Date  . APPENDECTOMY    . CARDIAC CATHETERIZATION N/A 09/05/2015   Procedure: Left Heart Cath and Coronary Angiography;  Surgeon: Troy Sine, MD;  Location: [facility];  Service: Cardiovascular;  Laterality: N/A;  . CIRCUMCISION     30+yrs ago  . COLONOSCOPY    . POLYPECTOMY    . s/p esophageal dilation    . TONSILLECTOMY    . UPPER GASTROINTESTINAL ENDOSCOPY      Family History  Problem Relation Age of Onset  . Hypertension Mother   . Cancer Father  prostate cancer  . Prostate cancer Father   . Hypertension Sister   . Hypertension Brother   . Colon cancer Neg Hx   . Rectal cancer Neg Hx   . Stomach cancer Neg Hx   . Esophageal cancer Neg Hx   . Pancreatic cancer Neg Hx     Social History   Tobacco Use  . Smoking status: Former Smoker    Packs/day: 0.50    Years: 12.00    Pack years: 6.00    Types: Cigarettes    Last attempt to quit: 09/16/1970    Years since quitting: 47.7  . Smokeless tobacco: Never Used  . Tobacco comment: hasn't smoked in 46  years;   Substance Use Topics  . Alcohol use: Yes    Alcohol/week: 0.0 standard drinks    Comment: occasional;     Subjective:  Patient presents with concerns for dizziness x 2-3 days; has noticed that he feels the dizziness for about 15-20 seconds when he first lays down at night/ when changing positions; does feel that the room is spinning when the symptoms happen; Denies any chest pain, shortness of breath, blurred vision or headache; is concerned that his blood pressure is elevated- home readings have been comparable to what is seen here; Patient is down 14 pounds from [place] in April 2019- notes he is trying to lose weight;  Patient does not take his Hydroxyurea regularly- overdue to see his oncologist;   Objective:  Vitals:   06/16/18 1535  BP: 140/80  Pulse: 78  Temp: 98.4 F (36.9 C)  TempSrc: Oral  SpO2: 96%  Weight: 180 lb 1.3 oz (81.7 kg)  Height: 5' 9"  (1.753 m)    General: Well developed, well nourished, in no acute distress  Skin : Warm and dry.  Head: Normocephalic and atraumatic  Eyes: Sclera and conjunctiva clear; pupils round and reactive to light; extraocular movements intact  Ears: External normal; canals clear; tympanic membranes congested bilaterally Oropharynx: Pink, supple. No suspicious lesions  Neck: Supple without thyromegaly, adenopathy  Lungs: Respirations unlabored;  CVS exam: normal rate and regular rhythm.  Neurologic: Alert and oriented; speech intact; face symmetrical; moves all extremities well; CNII-XII intact without focal deficit   Assessment:  1. Dizziness     Plan:  Suspect ETD; EKG shows no acute changes; update CBC, CMP today- patient is encouraged to consider going back to his oncologist; may need to consider increasing Lisinopril if blood pressure remains elevated; follow-up to be determined.   No follow-ups on file.  Orders Placed This Encounter  Procedures  . CBC w/Diff    Standing Status:   Future    Standing Expiration Date:    06/16/2019  . Comp Met (CMET)    Standing Status:   Future    Standing Expiration Date:   06/16/2019  . EKG 12-Lead    Requested Prescriptions   Signed Prescriptions Disp Refills  . fluticasone (FLONASE) 50 MCG/ACT nasal spray 16 g 6    Sig: Place 2 sprays into both nostrils daily.

## 2018-06-16 NOTE — Addendum Note (Signed)
Addended by: Marcina Millard on: 06/16/2018 05:02 PM   Modules accepted: Orders

## 2018-06-16 NOTE — Telephone Encounter (Signed)
CRITICAL VALUE STICKER  CRITICAL VALUE:  HGB:  19.7 HCT:  62.5

## 2018-06-17 ENCOUNTER — Other Ambulatory Visit: Payer: Self-pay | Admitting: Family

## 2018-06-17 DIAGNOSIS — D696 Thrombocytopenia, unspecified: Secondary | ICD-10-CM

## 2018-06-19 ENCOUNTER — Other Ambulatory Visit: Payer: Self-pay | Admitting: Oncology

## 2018-06-19 ENCOUNTER — Telehealth: Payer: Self-pay | Admitting: Oncology

## 2018-06-19 DIAGNOSIS — D751 Secondary polycythemia: Secondary | ICD-10-CM

## 2018-06-19 NOTE — Telephone Encounter (Signed)
Scheduled appt per 10/4 sch message - left message for patient with appt date and time

## 2018-06-23 ENCOUNTER — Inpatient Hospital Stay: Payer: 59

## 2018-06-23 ENCOUNTER — Inpatient Hospital Stay: Payer: 59 | Attending: Oncology | Admitting: Oncology

## 2018-06-24 ENCOUNTER — Other Ambulatory Visit: Payer: Self-pay | Admitting: Internal Medicine

## 2018-07-31 ENCOUNTER — Other Ambulatory Visit: Payer: Self-pay | Admitting: Internal Medicine

## 2018-07-31 ENCOUNTER — Other Ambulatory Visit: Payer: Self-pay | Admitting: Physician Assistant

## 2018-09-17 ENCOUNTER — Ambulatory Visit: Payer: Self-pay | Admitting: *Deleted

## 2018-09-17 NOTE — Telephone Encounter (Signed)
Summary: small amt of blood on tissue after BM / hx of hemorrhoids   Patient calling and states that he had a small amount of blood on the toilet tissue after wiping from a bowel movement. States that he does have a history of hemorrhoids. Does not get off work until 3- if you call before 3, you'll have to leave a voicemail.     Patient states he has started seeing a small amount of blood when wipes and on stoll for 2 days- he is not having any other symptoms. Patient does have history of hemorrhoids- but does not have evidence of them at this time. Appointment made for Monday- he will call if he gets worse- bleeding increases. Reason for Disposition . Age > 50 years  Answer Assessment - Initial Assessment Questions 1. APPEARANCE of BLOOD: "What color is it?" "Is it passed separately, on the surface of the stool, or mixed in with the stool?"      Bright red, mixed in stool- almost like blood clot 2. AMOUNT: "How much blood was passed?"      Small amount- 1/4 cup within last 4 days 3. FREQUENCY: "How many times has blood been passed with the stools?"      every time with BM  4. ONSET: "When was the blood first seen in the stools?" (Days or weeks)      2 days- Tuesday 5. DIARRHEA: "Is there also some diarrhea?" If so, ask: "How many diarrhea stools were passed in past 24 hours?"      Some loose stool- but not much 6. CONSTIPATION: "Do you have constipation?" If so, "How bad is it?"     no 7. RECURRENT SYMPTOMS: "Have you had blood in your stools before?" If so, ask: "When was the last time?" and "What happened that time?"      Hx of hemorrhoids can not tell if present now 8. BLOOD THINNERS: "Do you take any blood thinners?" (e.g., Coumadin/warfarin, Pradaxa/dabigatran, aspirin)     No- baby aspirin 9. OTHER SYMPTOMS: "Do you have any other symptoms?"  (e.g., abdominal pain, vomiting, dizziness, fever)     no 10. PREGNANCY: "Is there any chance you are pregnant?" "When was your last menstrual  period?"       n/a  Protocols used: RECTAL BLEEDING-A-AH

## 2018-09-18 NOTE — Telephone Encounter (Signed)
Scheduled with John instead for Monday at same time

## 2018-09-21 ENCOUNTER — Other Ambulatory Visit (INDEPENDENT_AMBULATORY_CARE_PROVIDER_SITE_OTHER): Payer: 59

## 2018-09-21 ENCOUNTER — Encounter: Payer: Self-pay | Admitting: Internal Medicine

## 2018-09-21 ENCOUNTER — Telehealth: Payer: Self-pay | Admitting: Internal Medicine

## 2018-09-21 ENCOUNTER — Ambulatory Visit (INDEPENDENT_AMBULATORY_CARE_PROVIDER_SITE_OTHER): Payer: 59 | Admitting: Internal Medicine

## 2018-09-21 VITALS — BP 144/86 | HR 84 | Temp 98.5°F | Ht 69.0 in | Wt 179.0 lb

## 2018-09-21 DIAGNOSIS — R7302 Impaired glucose tolerance (oral): Secondary | ICD-10-CM

## 2018-09-21 DIAGNOSIS — K921 Melena: Secondary | ICD-10-CM | POA: Insufficient documentation

## 2018-09-21 DIAGNOSIS — D75839 Thrombocytosis, unspecified: Secondary | ICD-10-CM

## 2018-09-21 DIAGNOSIS — I10 Essential (primary) hypertension: Secondary | ICD-10-CM | POA: Diagnosis not present

## 2018-09-21 DIAGNOSIS — D473 Essential (hemorrhagic) thrombocythemia: Secondary | ICD-10-CM

## 2018-09-21 DIAGNOSIS — D751 Secondary polycythemia: Secondary | ICD-10-CM

## 2018-09-21 DIAGNOSIS — D72829 Elevated white blood cell count, unspecified: Secondary | ICD-10-CM

## 2018-09-21 LAB — CBC WITH DIFFERENTIAL/PLATELET
BASOS ABS: 0.1 10*3/uL (ref 0.0–0.1)
Basophils Relative: 0.4 % (ref 0.0–3.0)
Eosinophils Absolute: 0.3 10*3/uL (ref 0.0–0.7)
Eosinophils Relative: 2.2 % (ref 0.0–5.0)
HCT: 66.7 % (ref 39.0–52.0)
Hemoglobin: 20.7 g/dL (ref 13.0–17.0)
LYMPHS ABS: 1.7 10*3/uL (ref 0.7–4.0)
Lymphocytes Relative: 11.9 % — ABNORMAL LOW (ref 12.0–46.0)
MCHC: 31 g/dL (ref 30.0–36.0)
MCV: 97 fl (ref 78.0–100.0)
Monocytes Absolute: 0.8 10*3/uL (ref 0.1–1.0)
Monocytes Relative: 5.8 % (ref 3.0–12.0)
NEUTROS ABS: 11.5 10*3/uL — AB (ref 1.4–7.7)
NEUTROS PCT: 79.7 % — AB (ref 43.0–77.0)
PLATELETS: 520 10*3/uL — AB (ref 150.0–400.0)
RBC: 6.87 Mil/uL — ABNORMAL HIGH (ref 4.22–5.81)
RDW: 15.9 % — ABNORMAL HIGH (ref 11.5–15.5)
WBC: 14.5 10*3/uL — ABNORMAL HIGH (ref 4.0–10.5)

## 2018-09-21 MED ORDER — TADALAFIL 20 MG PO TABS: 20.0000 mg | ORAL_TABLET | Freq: Every day | ORAL | 11 refills | Status: DC | PRN

## 2018-09-21 NOTE — Assessment & Plan Note (Signed)
Etiology unclear, cant r/o malignancy, for cbc bmp today and refer for colonoscopy,  to f/u any worsening symptoms or concerns

## 2018-09-21 NOTE — Progress Notes (Signed)
Subjective:    Patient ID: Dillon [name], male    DOB: 05/13/1946, 73 y.o.   MRN: [id number]  HPI    Here to f/u with c/o 3 days last wk of mild BRBPR, without pain, fever and Denies worsening reflux, abd pain, dysphagia, n/v, bowel change.  Pt denies chest pain, increased sob or doe, wheezing, orthopnea, PND, increased LE swelling, palpitations, dizziness or syncope.   Pt denies polydipsia, polyuria, Brother Herbie [place] died with lung cancer last year so he is very concerned.  Last colonoscopy nov 2015 with Dr Deatra Ina with recommendation at that time for f/u at 3 yrs.  Also incidentally with mild URI symptoms last wk, now near resolve with some occasional non prod cough.  Cont's to have ongoing ED symptoms, nothing makes better or worse, asks for refill med he sends to pharmacy in [place] Past Medical History:  Diagnosis Date  . Arthritis    right shoulder  . Bladder neck obstruction 04/18/2011  . CKD (chronic kidney disease), stage III ([place]) 09/12/2015  . ERECTILE DYSFUNCTION 11/15/2008  . Essential hypertension, benign 04/28/2013  . FATIGUE 11/15/2008  . GERD 11/15/2008  . History of adenomatous polyp of colon 05/06/2014   2010  . HYPERLIPIDEMIA 11/15/2008  . Impaired glucose tolerance 04/18/2011  . LEG PAIN, LEFT 11/05/2010  . LVH (left ventricular hypertrophy) 04/27/2012  . MIGRAINE, COMMON 08/06/2010  . Nocturia 12/19/2008  . Polycythemia 04/27/2012  . Swelling of limb 11/05/2010  . THROMBOCYTOSIS 12/19/2008   myleoproliferative disorder   Past Surgical History:  Procedure Laterality Date  . APPENDECTOMY    . CARDIAC CATHETERIZATION N/A 09/05/2015   Procedure: Left Heart Cath and Coronary Angiography;  Surgeon: Troy Sine, MD;  Location: [facility];  Service: Cardiovascular;  Laterality: N/A;  . CIRCUMCISION     30+yrs ago  . COLONOSCOPY    . POLYPECTOMY    . s/p esophageal dilation    . TONSILLECTOMY    . UPPER GASTROINTESTINAL ENDOSCOPY      reports that he quit smoking about  48 years ago. His smoking use included cigarettes. He has a 6.00 pack-year smoking history. He has never used smokeless tobacco. He reports current alcohol use. He reports that he does not use drugs. family history includes Cancer in his father; Hypertension in his brother, mother, and sister; Prostate cancer in his father. No Known Allergies Current Outpatient Medications on File Prior to Visit  Medication Sig Dispense Refill  . carvedilol (COREG) 12.5 MG tablet TAKE 1 TABLET (12.5 MG TOTAL) BY MOUTH 2 (TWO) TIMES DAILY WITH A MEAL. 60 tablet 5  . fluticasone (FLONASE) 50 MCG/ACT nasal spray Place 2 sprays into both nostrils daily. 16 g 6  . furosemide (LASIX) 20 MG tablet Take 1 tablet (20 mg total) by mouth daily as needed for edema. Please call to schedule overdue appt with Dr Georgina Peer (1st attempt) 30 tablet 5  . hydroxyurea (HYDREA) 500 MG capsule TAKE 1 CAPSULE BY MOUTH TWO TIMES DAILY. TAKE WITH FOOD TO MINIMIZE GI SIDE EFFECTS 90 capsule 3  . lisinopril (PRINIVIL,ZESTRIL) 2.5 MG tablet Take 2.5 mg by mouth daily.  3  . lisinopril (PRINIVIL,ZESTRIL) 2.5 MG tablet TAKE 1 TABLET BY MOUTH EVERY DAY 90 tablet 1  . lovastatin (MEVACOR) 40 MG tablet TAKE 1 TABLET (40 MG TOTAL) BY MOUTH DAILY. 30 tablet 2  . tamsulosin (FLOMAX) 0.4 MG CAPS capsule TAKE ONE CAPSULE BY MOUTH EVERY DAY 90 capsule 2   No current facility-administered  medications on file prior to visit.    Review of Systems  Constitutional: Negative for other unusual diaphoresis or sweats HENT: Negative for ear discharge or swelling Eyes: Negative for other worsening visual disturbances Respiratory: Negative for stridor or other swelling  Gastrointestinal: Negative for worsening distension or other blood Genitourinary: Negative for retention or other urinary change Musculoskeletal: Negative for other MSK pain or swelling Skin: Negative for color change or other new lesions Neurological: Negative for worsening tremors and other  numbness  Psychiatric/Behavioral: Negative for worsening agitation or other fatigue All other system neg per pt    Objective:   Physical Exam  BP (!) 144/86   Pulse 84   Temp 98.5 F (36.9 C) (Oral)   Ht 5\' 9"  (1.753 m)   Wt 179 lb (81.2 kg)   SpO2 96%   BMI 26.43 kg/m  VS noted,  Constitutional: Pt appears in NAD HENT: Head: NCAT.  Right Ear: External ear normal.  Left Ear: External ear normal.  Eyes: . Pupils are equal, round, and reactive to light. Conjunctivae and EOM are normal Nose: without d/c or deformity Neck: Neck supple. Gross normal ROM Cardiovascular: Normal rate and regular rhythm.   Pulmonary/Chest: Effort normal and breath sounds without rales or wheezing.  Abd:  Soft, NT, ND, + BS, no organomegaly Neurological: Pt is alert. At baseline orientation, motor grossly intact Skin: Skin is warm. No rashes, other new lesions, no LE edema Psychiatric: Pt behavior is normal without agitation  No other exam findings Lab Results  Component Value Date   WBC 11.3 (H) 06/16/2018   HGB 19.7 Repeated and verified X2. ([facility]) 06/16/2018   HCT 62.5 Repeated and verified X2. (HH) 06/16/2018   PLT 473.0 (H) 06/16/2018   GLUCOSE 106 (H) 06/16/2018   CHOL 123 12/15/2017   TRIG 230.0 (H) 12/15/2017   HDL 42.00 12/15/2017   LDLDIRECT 57.0 12/15/2017   LDLCALC 56 01/23/2016   ALT 19 06/16/2018   AST 19 06/16/2018   NA 139 06/16/2018   K 4.0 06/16/2018   CL 103 06/16/2018   CREATININE 1.38 06/16/2018   BUN 24 (H) 06/16/2018   CO2 26 06/16/2018   TSH 3.36 12/15/2017   PSA 1.92 12/15/2017   INR 1.32 09/04/2015   HGBA1C 5.4 12/15/2017       Assessment & Plan:

## 2018-09-21 NOTE — Assessment & Plan Note (Signed)
stable overall by history and exam, recent data reviewed with pt, and pt to continue medical treatment as before,  to f/u any worsening symptoms or concerns  

## 2018-09-21 NOTE — Telephone Encounter (Signed)
Dillon [name], MLT form [facility] called a critical HGB 20.7, HCT 66.7. Results read back and verified.

## 2018-09-21 NOTE — Patient Instructions (Signed)
Please continue all other medications as before, and refills have been done if requested - the cialis  Please have the pharmacy call with any other refills you may need.  Please continue your efforts at being more active, low cholesterol diet, and weight control.  Please keep your appointments with your specialists as you may have planned  You will be contacted regarding the referral for: colonoscopy  Please go to the LAB in the Basement (turn left off the elevator) for the tests to be done today  You will be contacted by phone if any changes need to be made immediately.  Otherwise, you will receive a letter about your results with an explanation, but please check with MyChart first.  Please remember to sign up for MyChart if you have not done so, as this will be important to you in the future with finding out test results, communicating by private email, and scheduling acute appointments online when needed.

## 2018-09-22 ENCOUNTER — Encounter: Payer: Self-pay | Admitting: Internal Medicine

## 2018-09-22 ENCOUNTER — Telehealth: Payer: Self-pay

## 2018-09-22 LAB — BASIC METABOLIC PANEL
BUN: 20 mg/dL (ref 6–23)
CALCIUM: 9.9 mg/dL (ref 8.4–10.5)
CO2: 26 meq/L (ref 19–32)
CREATININE: 1.31 mg/dL (ref 0.40–1.50)
Chloride: 103 mEq/L (ref 96–112)
GFR: 68.61 mL/min (ref >60.00)
GLUCOSE: 86 mg/dL (ref 70–99)
Potassium: 4.5 mEq/L (ref 3.5–5.1)
Sodium: 140 mEq/L (ref 135–145)

## 2018-09-22 NOTE — Telephone Encounter (Signed)
Pt has been informed of results and expressed understanding.  °

## 2018-09-22 NOTE — Telephone Encounter (Signed)
-----   Message from Biagio Borg, MD sent at 09/22/2018  1:01 PM EST ----- Pt has severe elevated Hgb (and milder increased platelets and white cells)  Refer to heme/onc asap  Shirron to please inform pt, I will do referral

## 2018-09-22 NOTE — Progress Notes (Signed)
Ok to contact pt - pt has increased Hgb that is now somewhat dangerous, as well mild elevated WBC and platelets as well  We need to refer ASAP to heme/onc.   - done

## 2018-09-25 ENCOUNTER — Other Ambulatory Visit: Payer: Self-pay | Admitting: Internal Medicine

## 2018-12-30 ENCOUNTER — Other Ambulatory Visit: Payer: Self-pay | Admitting: Internal Medicine

## 2019-02-18 ENCOUNTER — Other Ambulatory Visit: Payer: Self-pay | Admitting: Physician Assistant

## 2019-02-18 NOTE — Telephone Encounter (Signed)
This is a NL pt 

## 2019-03-19 ENCOUNTER — Other Ambulatory Visit: Payer: Self-pay | Admitting: Physician Assistant

## 2019-03-24 ENCOUNTER — Other Ambulatory Visit: Payer: Self-pay | Admitting: Internal Medicine

## 2019-04-05 ENCOUNTER — Other Ambulatory Visit: Payer: Self-pay | Admitting: Internal Medicine

## 2019-04-20 ENCOUNTER — Other Ambulatory Visit: Payer: Self-pay | Admitting: Internal Medicine

## 2019-05-06 ENCOUNTER — Other Ambulatory Visit: Payer: Self-pay

## 2019-05-06 MED ORDER — FLUTICASONE PROPIONATE 50 MCG/ACT NA SUSP: 2.0000 | Freq: Every day | NASAL | 6 refills | Status: DC

## 2019-05-14 ENCOUNTER — Encounter: Payer: Self-pay | Admitting: Internal Medicine

## 2019-05-14 ENCOUNTER — Other Ambulatory Visit (INDEPENDENT_AMBULATORY_CARE_PROVIDER_SITE_OTHER): Payer: 59

## 2019-05-14 ENCOUNTER — Other Ambulatory Visit: Payer: Self-pay | Admitting: Internal Medicine

## 2019-05-14 ENCOUNTER — Other Ambulatory Visit: Payer: Self-pay

## 2019-05-14 ENCOUNTER — Ambulatory Visit (INDEPENDENT_AMBULATORY_CARE_PROVIDER_SITE_OTHER): Payer: 59 | Admitting: Internal Medicine

## 2019-05-14 ENCOUNTER — Telehealth: Payer: Self-pay | Admitting: Emergency Medicine

## 2019-05-14 VITALS — BP 138/84 | HR 86 | Temp 98.3°F | Ht 69.0 in | Wt 177.0 lb

## 2019-05-14 DIAGNOSIS — M7989 Other specified soft tissue disorders: Secondary | ICD-10-CM

## 2019-05-14 DIAGNOSIS — Z23 Encounter for immunization: Secondary | ICD-10-CM | POA: Diagnosis not present

## 2019-05-14 DIAGNOSIS — E611 Iron deficiency: Secondary | ICD-10-CM

## 2019-05-14 DIAGNOSIS — I10 Essential (primary) hypertension: Secondary | ICD-10-CM | POA: Diagnosis not present

## 2019-05-14 DIAGNOSIS — E559 Vitamin D deficiency, unspecified: Secondary | ICD-10-CM

## 2019-05-14 DIAGNOSIS — E538 Deficiency of other specified B group vitamins: Secondary | ICD-10-CM

## 2019-05-14 DIAGNOSIS — D751 Secondary polycythemia: Secondary | ICD-10-CM

## 2019-05-14 DIAGNOSIS — R7302 Impaired glucose tolerance (oral): Secondary | ICD-10-CM

## 2019-05-14 DIAGNOSIS — M79661 Pain in right lower leg: Secondary | ICD-10-CM

## 2019-05-14 DIAGNOSIS — Z0001 Encounter for general adult medical examination with abnormal findings: Secondary | ICD-10-CM

## 2019-05-14 DIAGNOSIS — E785 Hyperlipidemia, unspecified: Secondary | ICD-10-CM

## 2019-05-14 LAB — CBC WITH DIFFERENTIAL/PLATELET
Basophils Absolute: 0.1 10*3/uL (ref 0.0–0.1)
Basophils Relative: 0.9 % (ref 0.0–3.0)
Eosinophils Absolute: 0.3 10*3/uL (ref 0.0–0.7)
Eosinophils Relative: 2 % (ref 0.0–5.0)
HCT: 63.3 % (ref 39.0–52.0)
Hemoglobin: 19.9 g/dL (ref 13.0–17.0)
Lymphocytes Relative: 10.4 % — ABNORMAL LOW (ref 12.0–46.0)
Lymphs Abs: 1.6 10*3/uL (ref 0.7–4.0)
MCHC: 31.5 g/dL (ref 30.0–36.0)
MCV: 95.4 fl (ref 78.0–100.0)
Monocytes Absolute: 0.9 10*3/uL (ref 0.1–1.0)
Monocytes Relative: 5.7 % (ref 3.0–12.0)
Neutro Abs: 12.3 10*3/uL — ABNORMAL HIGH (ref 1.4–7.7)
Neutrophils Relative %: 81 % — ABNORMAL HIGH (ref 43.0–77.0)
Platelets: 638 10*3/uL — ABNORMAL HIGH (ref 150.0–400.0)
RBC: 6.64 Mil/uL — ABNORMAL HIGH (ref 4.22–5.81)
RDW: 17 % — ABNORMAL HIGH (ref 11.5–15.5)
WBC: 15.2 10*3/uL — ABNORMAL HIGH (ref 4.0–10.5)

## 2019-05-14 LAB — URINALYSIS, ROUTINE W REFLEX MICROSCOPIC
Bilirubin Urine: NEGATIVE
Ketones, ur: NEGATIVE
Leukocytes,Ua: NEGATIVE
Nitrite: NEGATIVE
Specific Gravity, Urine: 1.02 (ref 1.000–1.030)
Urine Glucose: NEGATIVE
Urobilinogen, UA: 2 — AB (ref 0.0–1.0)
WBC, UA: NONE SEEN (ref 0–?)
pH: 7 (ref 5.0–8.0)

## 2019-05-14 LAB — HEPATIC FUNCTION PANEL
ALT: 16 U/L (ref 0–53)
AST: 15 U/L (ref 0–37)
Albumin: 4.6 g/dL (ref 3.5–5.2)
Alkaline Phosphatase: 80 U/L (ref 39–117)
Bilirubin, Direct: 0.2 mg/dL (ref 0.0–0.3)
Total Bilirubin: 1.2 mg/dL (ref 0.2–1.2)
Total Protein: 7.2 g/dL (ref 6.0–8.3)

## 2019-05-14 LAB — LIPID PANEL
Cholesterol: 129 mg/dL (ref 0–200)
HDL: 47.8 mg/dL (ref >39.00)
LDL Cholesterol: 59 mg/dL (ref 0–99)
NonHDL: 80.83
Total CHOL/HDL Ratio: 3
Triglycerides: 108 mg/dL (ref 0.0–149.0)
VLDL: 21.6 mg/dL (ref 0.0–40.0)

## 2019-05-14 LAB — BASIC METABOLIC PANEL
BUN: 16 mg/dL (ref 6–23)
CO2: 28 mEq/L (ref 19–32)
Calcium: 10.2 mg/dL (ref 8.4–10.5)
Chloride: 102 mEq/L (ref 96–112)
Creatinine, Ser: 1.25 mg/dL (ref 0.40–1.50)
GFR: 68.02 mL/min (ref >60.00)
Glucose, Bld: 86 mg/dL (ref 70–99)
Potassium: 4.8 mEq/L (ref 3.5–5.1)
Sodium: 139 mEq/L (ref 135–145)

## 2019-05-14 LAB — HEMOGLOBIN A1C: Hgb A1c MFr Bld: 5.6 % (ref 4.6–6.5)

## 2019-05-14 LAB — VITAMIN B12: Vitamin B-12: 235 pg/mL (ref 211–911)

## 2019-05-14 LAB — IBC PANEL
Iron: 106 ug/dL (ref 42–165)
Saturation Ratios: 34.7 % (ref 20.0–50.0)
Transferrin: 218 mg/dL (ref 212.0–360.0)

## 2019-05-14 LAB — TSH: TSH: 1.39 u[IU]/mL (ref 0.35–4.50)

## 2019-05-14 LAB — VITAMIN D 25 HYDROXY (VIT D DEFICIENCY, FRACTURES): VITD: 13.96 ng/mL — ABNORMAL LOW (ref 30.00–100.00)

## 2019-05-14 LAB — PSA: PSA: 1.5 ng/mL (ref 0.10–4.00)

## 2019-05-14 MED ORDER — VITAMIN D (ERGOCALCIFEROL) 1.25 MG (50000 UNIT) PO CAPS: 50000.0000 [IU] | ORAL_CAPSULE | ORAL | 0 refills | Status: DC

## 2019-05-14 MED ORDER — LOVASTATIN 40 MG PO TABS: 40.0000 mg | ORAL_TABLET | Freq: Every day | ORAL | 1 refills | Status: DC

## 2019-05-14 NOTE — Patient Instructions (Addendum)
You had the Tdap tetanus shot today  Please continue all other medications as before, and refills have been done if requested.  Please have the pharmacy call with any other refills you may need.  Please continue your efforts at being more active, low cholesterol diet, and weight control.  You are otherwise up to date with prevention measures today.  Please keep your appointments with your specialists as you may have planned  You will be contacted regarding the referral for: right leg vein test  You will be contacted regarding the referral for: Hematology for the high red blood cells  Please go to the LAB in the Basement (turn left off the elevator) for the tests to be done today  You will be contacted by phone if any changes need to be made immediately.  Otherwise, you will receive a letter about your results with an explanation, but please check with MyChart first.  Please remember to sign up for MyChart if you have not done so, as this will be important to you in the future with finding out test results, communicating by private email, and scheduling acute appointments online when needed.  Please return in 6 months, or sooner if needed

## 2019-05-14 NOTE — Progress Notes (Signed)
Subjective:    Patient ID: Dillon [name], male    DOB: September 03, 1946, 73 y.o.   MRN: [id number]  HPI  Here for wellness and f/u;  Overall doing ok;  Pt denies Chest pain, worsening SOB, DOE, wheezing, orthopnea, PND, palpitations, dizziness or syncope.  Pt denies neurological change such as new headache, facial or extremity weakness.  Pt denies polydipsia, polyuria, or low sugar symptoms. Pt states overall good compliance with treatment and medications, good tolerability, and has been trying to follow appropriate diet.  Pt denies worsening depressive symptoms, suicidal ideation or panic. No fever, night sweats, wt loss, loss of appetite, or other constitutional symptoms.  Pt states good ability with ADL's, has low fall risk, home safety reviewed and adequate, no other significant changes in hearing or vision, and only occasionally active with exercise. Also with acute onset pain and swelling x 1 wk without knee pain or swelling, but some dull intermittent discomfort to the calf without overlying skin change or redness.  Even after d/w pt regarding possible DVT, pt declines ED visit.  Pt denies fever, wt loss, night sweats, loss of appetite, or other constitutional symptoms.  Does also have polycythemia noted jan 2020 but pt states never notified of need for referral to heme. Past Medical History:  Diagnosis Date  . Arthritis    right shoulder  . Bladder neck obstruction 04/18/2011  . CKD (chronic kidney disease), stage III ([place]) 09/12/2015  . ERECTILE DYSFUNCTION 11/15/2008  . Essential hypertension, benign 04/28/2013  . FATIGUE 11/15/2008  . GERD 11/15/2008  . History of adenomatous polyp of colon 05/06/2014   2010  . HYPERLIPIDEMIA 11/15/2008  . Impaired glucose tolerance 04/18/2011  . LEG PAIN, LEFT 11/05/2010  . LVH (left ventricular hypertrophy) 04/27/2012  . MIGRAINE, COMMON 08/06/2010  . Nocturia 12/19/2008  . Polycythemia 04/27/2012  . Swelling of limb 11/05/2010  . THROMBOCYTOSIS 12/19/2008   myleoproliferative disorder   Past Surgical History:  Procedure Laterality Date  . APPENDECTOMY    . CARDIAC CATHETERIZATION N/A 09/05/2015   Procedure: Left Heart Cath and Coronary Angiography;  Surgeon: Troy Sine, MD;  Location: [facility];  Service: Cardiovascular;  Laterality: N/A;  . CIRCUMCISION     30+yrs ago  . COLONOSCOPY    . POLYPECTOMY    . s/p esophageal dilation    . TONSILLECTOMY    . UPPER GASTROINTESTINAL ENDOSCOPY      reports that he quit smoking about 48 years ago. His smoking use included cigarettes. He has a 6.00 pack-year smoking history. He has never used smokeless tobacco. He reports current alcohol use. He reports that he does not use drugs. family history includes Cancer in his father; Hypertension in his brother, mother, and sister; Prostate cancer in his father. No Known Allergies  Review of Systems Constitutional: Negative for other unusual diaphoresis, sweats, appetite or weight changes HENT: Negative for other worsening hearing loss, ear pain, facial swelling, mouth sores or neck stiffness.   Eyes: Negative for other worsening pain, redness or other visual disturbance.  Respiratory: Negative for other stridor or swelling Cardiovascular: Negative for other palpitations or other chest pain  Gastrointestinal: Negative for worsening diarrhea or loose stools, blood in stool, distention or other pain Genitourinary: Negative for hematuria, flank pain or other change in urine volume.  Musculoskeletal: Negative for myalgias or other joint swelling.  Skin: Negative for other color change, or other wound or worsening drainage.  Neurological: Negative for other syncope or numbness. Hematological: Negative  for other adenopathy or swelling Psychiatric/Behavioral: Negative for hallucinations, other worsening agitation, SI, self-injury, or new decreased concentration ALl other system neg per pt    Objective:   Physical Exam BP 138/84   Pulse 86    Temp 98.3 F (36.8 C) (Oral)   Ht 5\' 9"  (1.753 m)   Wt 177 lb (80.3 kg)   SpO2 97%   BMI 26.14 kg/m  VS noted,  Constitutional: Pt is oriented to person, place, and time. Appears well-developed and well-nourished, in no significant distress and comfortable Head: Normocephalic and atraumatic  Eyes: Conjunctivae and EOM are normal. Pupils are equal, round, and reactive to light Right Ear: External ear normal without discharge Left Ear: External ear normal without discharge Nose: Nose without discharge or deformity Mouth/Throat: Oropharynx is without other ulcerations and moist  Neck: Normal range of motion. Neck supple. No JVD present. No tracheal deviation present or significant neck LA or mass Cardiovascular: Normal rate, regular rhythm, normal heart sounds and intact distal pulses.   Pulmonary/Chest: WOB normal and breath sounds without rales or wheezing  Abdominal: Soft. Bowel sounds are normal. NT. No HSM  Musculoskeletal: Normal range of motion. Exhibits no edema Lymphadenopathy: Has no other cervical adenopathy.  Neurological: Pt is alert and oriented to person, place, and time. Pt has normal reflexes. No cranial nerve deficit. Motor grossly intact, Gait intact Skin: Skin is warm and dry. No rash noted or new ulcerations, but right leg 1-2+ swelling with calf tender without overlying skin change Psychiatric:  Has normal mood and affect. Behavior is normal without agitation No other exam findings Lab Results  Component Value Date   WBC 15.2 (H) 05/14/2019   HGB 19.9 Repeated and verified X2. (HH) 05/14/2019   HCT 63.3 Repeated and verified X2. (HH) 05/14/2019   PLT 638.0 (H) 05/14/2019   GLUCOSE 86 05/14/2019   CHOL 129 05/14/2019   TRIG 108.0 05/14/2019   HDL 47.80 05/14/2019   LDLDIRECT 57.0 12/15/2017   LDLCALC 59 05/14/2019   ALT 16 05/14/2019   AST 15 05/14/2019   NA 139 05/14/2019   K 4.8 05/14/2019   CL 102 05/14/2019   CREATININE 1.25 05/14/2019   BUN 16  05/14/2019   CO2 28 05/14/2019   TSH 1.39 05/14/2019   PSA 1.50 05/14/2019   INR 1.32 09/04/2015   HGBA1C 5.6 05/14/2019          Assessment & Plan:

## 2019-05-14 NOTE — Telephone Encounter (Signed)
Critical Lab Value  19.9 hgb 63.6 hct

## 2019-05-16 ENCOUNTER — Encounter: Payer: Self-pay | Admitting: Internal Medicine

## 2019-05-16 NOTE — Assessment & Plan Note (Signed)

## 2019-05-16 NOTE — Assessment & Plan Note (Addendum)
Severe persistent, will need hemo referral  In addition to the time spent performing CPE, I spent an additional 25 minutes face to face,in which greater than 50% of this time was spent in counseling and coordination of care for patient's acute illness as documented, including the differential dx, treatment, further evaluation and other management of polycythemia, right leg pain and sweling, hyperglycemia, HLD, HTN

## 2019-05-16 NOTE — Assessment & Plan Note (Signed)
stable overall by history and exam, recent data reviewed with pt, and pt to continue medical treatment as before,  to f/u any worsening symptoms or concerns  

## 2019-05-16 NOTE — Assessment & Plan Note (Signed)
With suspicion for DVT, declines ED visit not, for RLE venous doppler asap, cont same tx for now

## 2019-05-17 ENCOUNTER — Ambulatory Visit (HOSPITAL_COMMUNITY)
Admission: RE | Admit: 2019-05-17 | Payer: 59 | Source: Ambulatory Visit | Attending: Internal Medicine | Admitting: Internal Medicine

## 2019-05-17 ENCOUNTER — Telehealth: Payer: Self-pay

## 2019-05-17 NOTE — Telephone Encounter (Signed)
Called pt, LVM.   CRM created.  

## 2019-05-17 NOTE — Telephone Encounter (Signed)
-----   Message from Biagio Borg, MD sent at 05/14/2019  8:10 PM EDT ----- Letter sent, cont same tx except   The test results show that your current treatment is OK, except the blood counts are high as expected, and the Vitamin D level is low.  Please continue with the Hematology (blood doctor) referral, and Please take Vitamin D 50000 units weekly for 12 weeks, then plan to change to OTC Vitamin D3 at 2000 units per day, indefinitely.  Dillon [name] to please inform pt, I will do rx

## 2019-05-28 ENCOUNTER — Telehealth: Payer: Self-pay | Admitting: Oncology

## 2019-05-28 NOTE — Telephone Encounter (Signed)
Called and left msg. Mailed printout  °

## 2019-06-15 ENCOUNTER — Inpatient Hospital Stay: Payer: 59 | Attending: Oncology | Admitting: Oncology

## 2019-06-15 ENCOUNTER — Inpatient Hospital Stay: Payer: 59

## 2019-07-19 ENCOUNTER — Encounter: Payer: Self-pay | Admitting: Gastroenterology

## 2019-08-02 ENCOUNTER — Other Ambulatory Visit: Payer: Self-pay | Admitting: Internal Medicine

## 2019-10-06 ENCOUNTER — Other Ambulatory Visit: Payer: Self-pay | Admitting: Internal Medicine

## 2019-11-10 ENCOUNTER — Other Ambulatory Visit: Payer: Self-pay

## 2019-11-10 ENCOUNTER — Ambulatory Visit (INDEPENDENT_AMBULATORY_CARE_PROVIDER_SITE_OTHER): Payer: 59 | Admitting: Internal Medicine

## 2019-11-10 ENCOUNTER — Encounter: Payer: Self-pay | Admitting: Internal Medicine

## 2019-11-10 ENCOUNTER — Telehealth: Payer: Self-pay

## 2019-11-10 VITALS — BP 138/86 | HR 97 | Temp 98.6°F | Ht 69.0 in | Wt 172.8 lb

## 2019-11-10 DIAGNOSIS — Z0000 Encounter for general adult medical examination without abnormal findings: Secondary | ICD-10-CM | POA: Insufficient documentation

## 2019-11-10 DIAGNOSIS — R7302 Impaired glucose tolerance (oral): Secondary | ICD-10-CM

## 2019-11-10 DIAGNOSIS — D473 Essential (hemorrhagic) thrombocythemia: Secondary | ICD-10-CM | POA: Diagnosis not present

## 2019-11-10 DIAGNOSIS — D751 Secondary polycythemia: Secondary | ICD-10-CM

## 2019-11-10 DIAGNOSIS — D72829 Elevated white blood cell count, unspecified: Secondary | ICD-10-CM

## 2019-11-10 DIAGNOSIS — D75839 Thrombocytosis, unspecified: Secondary | ICD-10-CM

## 2019-11-10 DIAGNOSIS — Z0001 Encounter for general adult medical examination with abnormal findings: Secondary | ICD-10-CM | POA: Insufficient documentation

## 2019-11-10 LAB — CBC WITH DIFFERENTIAL/PLATELET
Basophils Absolute: 0.1 10*3/uL (ref 0.0–0.1)
Basophils Relative: 0.4 % (ref 0.0–3.0)
Eosinophils Absolute: 0.4 10*3/uL (ref 0.0–0.7)
Eosinophils Relative: 2.4 % (ref 0.0–5.0)
HCT: 66.2 % (ref 39.0–52.0)
Hemoglobin: 20.4 g/dL (ref 13.0–17.0)
Lymphocytes Relative: 7.6 % — ABNORMAL LOW (ref 12.0–46.0)
Lymphs Abs: 1.4 10*3/uL (ref 0.7–4.0)
MCHC: 30.8 g/dL (ref 30.0–36.0)
MCV: 90.4 fl (ref 78.0–100.0)
Monocytes Absolute: 1 10*3/uL (ref 0.1–1.0)
Monocytes Relative: 5.2 % (ref 3.0–12.0)
Neutro Abs: 15.7 10*3/uL — ABNORMAL HIGH (ref 1.4–7.7)
Neutrophils Relative %: 84.4 % — ABNORMAL HIGH (ref 43.0–77.0)
Platelets: 1049 10*3/uL (ref 150.0–400.0)
RBC: 7.32 Mil/uL — ABNORMAL HIGH (ref 4.22–5.81)
RDW: 15.6 % — ABNORMAL HIGH (ref 11.5–15.5)
WBC: 18.5 10*3/uL (ref 4.0–10.5)

## 2019-11-10 LAB — HEPATIC FUNCTION PANEL
ALT: 18 U/L (ref 0–53)
AST: 16 U/L (ref 0–37)
Albumin: 4.4 g/dL (ref 3.5–5.2)
Alkaline Phosphatase: 81 U/L (ref 39–117)
Bilirubin, Direct: 0.4 mg/dL — ABNORMAL HIGH (ref 0.0–0.3)
Total Bilirubin: 1.7 mg/dL — ABNORMAL HIGH (ref 0.2–1.2)
Total Protein: 7.1 g/dL (ref 6.0–8.3)

## 2019-11-10 LAB — LIPID PANEL
Cholesterol: 143 mg/dL (ref 0–200)
HDL: 46.9 mg/dL (ref >39.00)
LDL Cholesterol: 80 mg/dL (ref 0–99)
NonHDL: 96.26
Total CHOL/HDL Ratio: 3
Triglycerides: 81 mg/dL (ref 0.0–149.0)
VLDL: 16.2 mg/dL (ref 0.0–40.0)

## 2019-11-10 LAB — BASIC METABOLIC PANEL
BUN: 23 mg/dL (ref 6–23)
CO2: 22 mEq/L (ref 19–32)
Calcium: 10.9 mg/dL — ABNORMAL HIGH (ref 8.4–10.5)
Chloride: 105 mEq/L (ref 96–112)
Creatinine, Ser: 1.23 mg/dL (ref 0.40–1.50)
GFR: 69.21 mL/min (ref >60.00)
Glucose, Bld: 72 mg/dL (ref 70–99)
Potassium: 5.3 mEq/L — ABNORMAL HIGH (ref 3.5–5.1)
Sodium: 140 mEq/L (ref 135–145)

## 2019-11-10 LAB — TSH: TSH: 3.11 u[IU]/mL (ref 0.35–4.50)

## 2019-11-10 LAB — PSA: PSA: 1.81 ng/mL (ref 0.10–4.00)

## 2019-11-10 MED ORDER — TADALAFIL 20 MG PO TABS: 20.0000 mg | ORAL_TABLET | Freq: Every day | ORAL | 11 refills | Status: DC | PRN

## 2019-11-10 MED ORDER — LOVASTATIN 40 MG PO TABS: 40.0000 mg | ORAL_TABLET | Freq: Every day | ORAL | 3 refills | Status: DC

## 2019-11-10 NOTE — Addendum Note (Signed)
Addended by: Biagio Borg on: 11/10/2019 05:05 PM   Modules accepted: Orders

## 2019-11-10 NOTE — Assessment & Plan Note (Signed)

## 2019-11-10 NOTE — Assessment & Plan Note (Signed)
For f/u lab and heme referral

## 2019-11-10 NOTE — Progress Notes (Signed)
Subjective:    Patient ID: Dillon [name], male    DOB: Jan 18, 1946, 74 y.o.   MRN: [id number]  HPI   Here for wellness and f/u;  Overall doing ok;  Pt denies Chest pain, worsening SOB, DOE, wheezing, orthopnea, PND, worsening LE edema, palpitations, dizziness or syncope.  Pt denies neurological change such as new headache, facial or extremity weakness.  Pt denies polydipsia, polyuria, or low sugar symptoms. Pt states overall good compliance with treatment and medications, good tolerability, and has been trying to follow appropriate diet.  Pt denies worsening depressive symptoms, suicidal ideation or panic. No fever, night sweats, wt loss, loss of appetite, or other constitutional symptoms.  Pt states good ability with ADL's, has low fall risk, home safety reviewed and adequate, no other significant changes in hearing or vision, and only occasionally active with exercise. Finished his high dose Vit D but not taking the lower daily dose.  Did not know about the heme referral.   Past Medical History:  Diagnosis Date  . Arthritis    right shoulder  . Bladder neck obstruction 04/18/2011  . CKD (chronic kidney disease), stage III 09/12/2015  . ERECTILE DYSFUNCTION 11/15/2008  . Essential hypertension, benign 04/28/2013  . FATIGUE 11/15/2008  . GERD 11/15/2008  . History of adenomatous polyp of colon 05/06/2014   2010  . HYPERLIPIDEMIA 11/15/2008  . Impaired glucose tolerance 04/18/2011  . LEG PAIN, LEFT 11/05/2010  . LVH (left ventricular hypertrophy) 04/27/2012  . MIGRAINE, COMMON 08/06/2010  . Nocturia 12/19/2008  . Polycythemia 04/27/2012  . Swelling of limb 11/05/2010  . THROMBOCYTOSIS 12/19/2008   myleoproliferative disorder   Past Surgical History:  Procedure Laterality Date  . APPENDECTOMY    . CARDIAC CATHETERIZATION N/A 09/05/2015   Procedure: Left Heart Cath and Coronary Angiography;  Surgeon: Troy Sine, MD;  Location: [facility];  Service: Cardiovascular;  Laterality: N/A;  .  CIRCUMCISION     30+yrs ago  . COLONOSCOPY    . POLYPECTOMY    . s/p esophageal dilation    . TONSILLECTOMY    . UPPER GASTROINTESTINAL ENDOSCOPY      reports that he quit smoking about 49 years ago. His smoking use included cigarettes. He has a 6.00 pack-year smoking history. He has never used smokeless tobacco. He reports current alcohol use. He reports that he does not use drugs. family history includes Cancer in his father; Hypertension in his brother, mother, and sister; Prostate cancer in his father. No Known Allergies Current Outpatient Medications on File Prior to Visit  Medication Sig Dispense Refill  . carvedilol (COREG) 12.5 MG tablet TAKE 1 TABLET (12.5 MG TOTAL) BY MOUTH 2 (TWO) TIMES DAILY WITH A MEAL. 60 tablet 5  . fluticasone (FLONASE) 50 MCG/ACT nasal spray Place 2 sprays into both nostrils daily. 16 g 6  . furosemide (LASIX) 20 MG tablet TAKE 1 TABLET BY MOUTH DAILY AS NEEDED FOR EDEMA.PLEASE CALL TO SCHEDULE OVERDUE APPT WITH DR KELLEY 30 tablet 0  . hydroxyurea (HYDREA) 500 MG capsule TAKE 1 CAPSULE BY MOUTH TWO TIMES DAILY. TAKE WITH FOOD TO MINIMIZE GI SIDE EFFECTS 90 capsule 3  . lisinopril (PRINIVIL,ZESTRIL) 2.5 MG tablet Take 2.5 mg by mouth daily.  3  . tamsulosin (FLOMAX) 0.4 MG CAPS capsule TAKE 1 CAPSULE BY MOUTH EVERY DAY 90 capsule 1  . Vitamin D, Ergocalciferol, (DRISDOL) 1.25 MG (50000 UT) CAPS capsule Take 1 capsule (50,000 Units total) by mouth every 7 (seven) days. 12 capsule  0   No current facility-administered medications on file prior to visit.   Review of Systems All otherwise neg per pt     Objective:   Physical Exam BP 138/86 (BP Location: Left Arm, Patient Position: Sitting, Cuff Size: Normal)   Pulse 97   Temp 98.6 F (37 C) (Oral)   Ht 5\' 9"  (1.753 m)   Wt 172 lb 12.8 oz (78.4 kg)   SpO2 96%   BMI 25.52 kg/m  VS noted,  Constitutional: Pt appears in NAD HENT: Head: NCAT.  Right Ear: External ear normal.  Left Ear: External ear  normal.  Eyes: . Pupils are equal, round, and reactive to light. Conjunctivae and EOM are normal Nose: without d/c or deformity Neck: Neck supple. Gross normal ROM Cardiovascular: Normal rate and regular rhythm.   Pulmonary/Chest: Effort normal and breath sounds without rales or wheezing.  Abd:  Soft, NT, ND, + BS, no organomegaly Neurological: Pt is alert. At baseline orientation, motor grossly intact Skin: Skin is warm. No rashes, other new lesions, no LE edema Psychiatric: Pt behavior is normal without agitation  Lab Results  Component Value Date   WBC 15.2 (H) 05/14/2019   HGB 19.9 Repeated and verified X2. (HH) 05/14/2019   HCT 63.3 Repeated and verified X2. (HH) 05/14/2019   PLT 638.0 (H) 05/14/2019   GLUCOSE 86 05/14/2019   CHOL 129 05/14/2019   TRIG 108.0 05/14/2019   HDL 47.80 05/14/2019   LDLDIRECT 57.0 12/15/2017   LDLCALC 59 05/14/2019   ALT 16 05/14/2019   AST 15 05/14/2019   NA 139 05/14/2019   K 4.8 05/14/2019   CL 102 05/14/2019   CREATININE 1.25 05/14/2019   BUN 16 05/14/2019   CO2 28 05/14/2019   TSH 1.39 05/14/2019   PSA 1.50 05/14/2019   INR 1.32 09/04/2015   HGBA1C 5.6 05/14/2019          Assessment & Plan:

## 2019-11-10 NOTE — Addendum Note (Signed)
Addended by: Biagio Borg on: 11/10/2019 03:24 PM   Modules accepted: Orders

## 2019-11-10 NOTE — Assessment & Plan Note (Signed)
stable overall by history and exam, recent data reviewed with pt, and pt to continue medical treatment as before,  to f/u any worsening symptoms or concerns  

## 2019-11-10 NOTE — Addendum Note (Signed)
Addended by: Trenda Moots on: [id number] 03:37 PM   Modules accepted: Orders

## 2019-11-10 NOTE — Telephone Encounter (Signed)
Received a call from Santiago Glad at [facility] lab.. Critical values   WBC-18.5 Hemoglobin 20.4 Hematocrit-66.2 Platelet Count -1,049,000    Dr. Jenny Reichmann is aware

## 2019-11-10 NOTE — Patient Instructions (Signed)
Please continue all other medications as before, and refills have been done if requested.  Please have the pharmacy call with any other refills you may need.  Please continue your efforts at being more active, low cholesterol diet, and weight control.  You are otherwise up to date with prevention measures today.  Please keep your appointments with your specialists as you may have planned  You will be contacted regarding the referral for: Hematology  Please go to the LAB at the blood drawing area for the tests to be done  You will be contacted by phone if any changes need to be made immediately.  Otherwise, you will receive a letter about your results with an explanation, but please check with MyChart first.  Please remember to sign up for MyChart if you have not done so, as this will be important to you in the future with finding out test results, communicating by private email, and scheduling acute appointments online when needed.  Please make an Appointment to return in 6 months, or sooner if needed

## 2019-11-11 ENCOUNTER — Telehealth: Payer: Self-pay

## 2019-11-11 ENCOUNTER — Other Ambulatory Visit: Payer: Self-pay | Admitting: Internal Medicine

## 2019-11-11 ENCOUNTER — Telehealth: Payer: Self-pay | Admitting: Oncology

## 2019-11-11 LAB — URINALYSIS, ROUTINE W REFLEX MICROSCOPIC
Bilirubin Urine: NEGATIVE
Hgb urine dipstick: NEGATIVE
Leukocytes,Ua: NEGATIVE
Nitrite: NEGATIVE
RBC / HPF: NONE SEEN (ref 0–?)
Specific Gravity, Urine: >=1.03 — AB (ref 1.000–1.030)
Total Protein, Urine: 30 — AB
Urine Glucose: NEGATIVE
Urobilinogen, UA: 0.2 (ref 0.0–1.0)
pH: 5.5 (ref 5.0–8.0)

## 2019-11-11 LAB — HEMOGLOBIN A1C: Hgb A1c MFr Bld: 5.6 % (ref 4.6–6.5)

## 2019-11-11 NOTE — Telephone Encounter (Signed)
Received a new hem referral from Dr. Jenny Reichmann. Dillon [name] has been cld and scheduled to see Dillon [name] on 3/9 at 2pm. Pt aware to arrive 15 minutes early.

## 2019-11-11 NOTE — Telephone Encounter (Signed)
Please refill as per office routine med refill policy (all routine meds refilled for 3 mo or monthly per pt preference up to one year from last visit, then month to month grace period for 3 mo, then further med refills will have to be denied)  

## 2019-11-11 NOTE — Telephone Encounter (Signed)
Patient calling and states that he is at the pharmacy and that they told him they do not have any medications for him. Patient states that he was in the office yesterday 11/10/2019 and said that he was about out of all of his medications and needed refills. Please advise.   [id number] - [place], [place] [address]

## 2019-11-12 NOTE — Telephone Encounter (Signed)
Pt contacted and informed that rx for lisinopril has been sent in.

## 2019-11-13 ENCOUNTER — Encounter: Payer: Self-pay | Admitting: Internal Medicine

## 2019-11-15 ENCOUNTER — Encounter: Payer: Self-pay | Admitting: Gastroenterology

## 2019-11-17 ENCOUNTER — Ambulatory Visit (AMBULATORY_SURGERY_CENTER): Payer: Self-pay | Admitting: *Deleted

## 2019-11-17 ENCOUNTER — Other Ambulatory Visit: Payer: Self-pay

## 2019-11-17 VITALS — Temp 97.9°F | Ht 69.0 in | Wt 170.0 lb

## 2019-11-17 DIAGNOSIS — Z86010 Personal history of colonic polyps: Secondary | ICD-10-CM

## 2019-11-17 NOTE — Progress Notes (Signed)
Plenvu sample provided Lot [id number] Exp 12/2019

## 2019-11-17 NOTE — Progress Notes (Signed)

## 2019-11-23 ENCOUNTER — Inpatient Hospital Stay: Payer: 59 | Attending: Oncology | Admitting: Oncology

## 2019-11-23 ENCOUNTER — Other Ambulatory Visit: Payer: Self-pay

## 2019-11-23 VITALS — BP 153/97 | HR 92 | Temp 97.8°F | Resp 20 | Ht 69.0 in | Wt 173.7 lb

## 2019-11-23 DIAGNOSIS — D751 Secondary polycythemia: Secondary | ICD-10-CM

## 2019-11-23 DIAGNOSIS — D473 Essential (hemorrhagic) thrombocythemia: Secondary | ICD-10-CM | POA: Insufficient documentation

## 2019-11-23 DIAGNOSIS — D471 Chronic myeloproliferative disease: Secondary | ICD-10-CM | POA: Diagnosis present

## 2019-11-23 MED ORDER — HYDROXYUREA 500 MG PO CAPS: 500.0000 mg | ORAL_CAPSULE | Freq: Every day | ORAL | 3 refills | Status: DC

## 2019-11-23 NOTE — Progress Notes (Signed)
Hematology and Oncology Follow Up Visit  Dillon [name] [id number] 1946/01/27 74 y.o. 11/23/2019 2:05 PM  CC: Dillon Borg, MD    Principle Diagnosis: 74 year old man JAK2 positive myeloproliferative disorder diagnosed in March 2010.  He has essential thrombocytosis at the time of diagnosis.   Current therapy:  1.  Hydroxyurea 500 mg twice a day.  He has not taken it for the last 3 years. 2. He is on aspirin 81 mg daily.    Interim History: [name] returns today for a repeat evaluation.  Is a pleasant gentleman with the above diagnosis although he has not been following regularly with the last evaluation close to 4 years ago.  Since the last visit, he reports no major changes in his health.  He continues to work and attends activities of daily living without any decline ability to do so.  He denies any thrombosis or bleeding episodes.  Home status quality of life remain excellent.  Not any chest pain, headaches or angina.   Medications: Reviewed and updated.  Current Outpatient Medications  Medication Sig Dispense Refill  . carvedilol (COREG) 12.5 MG tablet TAKE 1 TABLET (12.5 MG TOTAL) BY MOUTH 2 (TWO) TIMES DAILY WITH A MEAL. 60 tablet 5  . fluticasone (FLONASE) 50 MCG/ACT nasal spray Place 2 sprays into both nostrils daily. 16 g 6  . furosemide (LASIX) 20 MG tablet TAKE 1 TABLET BY MOUTH DAILY AS NEEDED FOR EDEMA.PLEASE CALL TO SCHEDULE OVERDUE APPT WITH DR Georgina Peer (Patient not taking: Reported on 11/17/2019) 30 tablet 0  . hydroxyurea (HYDREA) 500 MG capsule TAKE 1 CAPSULE BY MOUTH TWO TIMES DAILY. TAKE WITH FOOD TO MINIMIZE GI SIDE EFFECTS (Patient not taking: Reported on 11/17/2019) 90 capsule 3  . lisinopril (ZESTRIL) 2.5 MG tablet Take 1 tablet (2.5 mg total) by mouth daily. 90 tablet 3  . lovastatin (MEVACOR) 40 MG tablet Take 1 tablet (40 mg total) by mouth daily. 90 tablet 3  . PEG-KCl-NaCl-NaSulf-Na Asc-C (PLENVU) 140 g SOLR Take 1 kit by mouth once. Colonoscopy bowel prep  ( sample provided)    . tadalafil (CIALIS) 20 MG tablet Take 1 tablet (20 mg total) by mouth daily as needed for erectile dysfunction. 40 tablet 11  . tamsulosin (FLOMAX) 0.4 MG CAPS capsule TAKE 1 CAPSULE BY MOUTH EVERY DAY 90 capsule 1  . Vitamin D, Ergocalciferol, (DRISDOL) 1.25 MG (50000 UT) CAPS capsule Take 1 capsule (50,000 Units total) by mouth every 7 (seven) days. (Patient not taking: Reported on 11/17/2019) 12 capsule 0   No current facility-administered medications for this visit.    Allergies: No Known Allergies     Physical Exam: Blood pressure (!) 153/97, pulse 92, temperature 97.8 F (36.6 C), resp. rate 20, height 5' 9"  (1.753 m), weight 173 lb 11.2 oz (78.8 kg), SpO2 100 %.   ECOG: 1   General appearance: Comfortable appearing without any discomfort Head: Normocephalic without any trauma Oropharynx: Mucous membranes are moist and pink without any thrush or ulcers. Eyes: Pupils are equal and round reactive to light. Lymph nodes: No cervical, supraclavicular, inguinal or axillary lymphadenopathy.   Heart:regular rate and rhythm.  S1 and S2 without leg edema. Lung: Clear without any rhonchi or wheezes.  No dullness to percussion. Abdomin: Soft, nontender, nondistended with good bowel sounds.  No hepatosplenomegaly. Musculoskeletal: No joint deformity or effusion.  Full range of motion noted. Neurological: No deficits noted on motor, sensory and deep tendon reflex exam. Skin: No petechial rash or dryness.  Appeared  moist.   Lab Results: Lab Results  Component Value Date   WBC 18.5 Repeated and verified X2. (HH) 11/10/2019   HGB 20.4 Repeated and verified X2. (HH) 11/10/2019   HCT 66.2 Repeated and verified X2. (HH) 11/10/2019   MCV 90.4 11/10/2019   PLT 1049.0 Repeated and verified X2. (HH) 11/10/2019     Chemistry      Component Value Date/Time   NA 140 11/10/2019 1537   NA 141 11/03/2015 1543   K 5.3 No hemolysis seen (H) 11/10/2019 1537   K 4.3  11/03/2015 1543   CL 105 11/10/2019 1537   CO2 22 11/10/2019 1537   CO2 24 11/03/2015 1543   BUN 23 11/10/2019 1537   BUN 16.5 11/03/2015 1543   CREATININE 1.23 11/10/2019 1537   CREATININE 1.2 11/03/2015 1543      Component Value Date/Time   CALCIUM 10.9 (H) 11/10/2019 1537   CALCIUM 9.4 11/03/2015 1543   ALKPHOS 81 11/10/2019 1537   ALKPHOS 74 11/03/2015 1543   AST 16 11/10/2019 1537   AST 21 11/03/2015 1543   ALT 18 11/10/2019 1537   ALT 29 11/03/2015 1543   BILITOT 1.7 (H) 11/10/2019 1537   BILITOT 0.78 11/03/2015 1543      Assessment and plan:    74 year old man with  1.  Essential thrombocythemia diagnosed in 2010.  He presented with JAK2 positive myeloproliferative disorder with elevated platelets.  He has been on hydroxyurea although noncompliant with this medication.   Laboratory data obtained on February 24 showed a white cell count elevation of 18,000, hemoglobin is up to 20 with a platelet count of 1049.  The natural course of this disease was reviewed and potential complication associated with poorly controlled disease including thrombosis such as strokes and myocardial infarction, angina, bleeding complications among others.  Treatment options were reviewed which include therapeutic phlebotomy as well as hydroxyurea were reviewed today.  After discussion today, he opted to restart hydroxyurea at 500 mg daily and will escalate the dose pending his results in the future.  Emphasized the importance of adherence this medication for the time being.  2.  Thrombosis prophylaxis: I recommended continuing aspirin 81 mg daily.  3.  Follow-up: Will be in 6 months for repeat evaluation.  30  minutes were dedicated to this visit. The time was spent on reviewing laboratory data, discussing treatment options, and answering questions regarding future plan.    Zola Button 3/9/20212:05 PM

## 2019-11-24 ENCOUNTER — Telehealth: Payer: Self-pay | Admitting: Oncology

## 2019-11-24 NOTE — Telephone Encounter (Signed)
Scheduled appt per 3/9 los.  Sent a message to HIM pool to get a calendar mailed out. 

## 2019-12-01 ENCOUNTER — Other Ambulatory Visit: Payer: Self-pay | Admitting: Gastroenterology

## 2019-12-01 ENCOUNTER — Ambulatory Visit (AMBULATORY_SURGERY_CENTER): Payer: 59 | Admitting: Gastroenterology

## 2019-12-01 ENCOUNTER — Encounter: Payer: Self-pay | Admitting: Gastroenterology

## 2019-12-01 ENCOUNTER — Other Ambulatory Visit: Payer: Self-pay

## 2019-12-01 VITALS — BP 145/93 | HR 77 | Temp 96.8°F | Resp 12 | Ht 69.0 in | Wt 170.0 lb

## 2019-12-01 DIAGNOSIS — D124 Benign neoplasm of descending colon: Secondary | ICD-10-CM

## 2019-12-01 DIAGNOSIS — D125 Benign neoplasm of sigmoid colon: Secondary | ICD-10-CM

## 2019-12-01 DIAGNOSIS — D123 Benign neoplasm of transverse colon: Secondary | ICD-10-CM

## 2019-12-01 DIAGNOSIS — Z86010 Personal history of colonic polyps: Secondary | ICD-10-CM | POA: Diagnosis not present

## 2019-12-01 MED ORDER — SODIUM CHLORIDE 0.9 % IV SOLN: 500.0000 mL | Freq: Once | INTRAVENOUS | Status: DC

## 2019-12-01 NOTE — Progress Notes (Signed)
Called to room to assist during endoscopic procedure.  Patient ID and intended procedure confirmed with present staff. Received instructions for my participation in the procedure from the performing physician.  

## 2019-12-01 NOTE — Progress Notes (Signed)
Report given to PACU, vss 

## 2019-12-01 NOTE — Op Note (Signed)
[place] Patient Name: Dillon [name] Procedure Date: 12/01/2019 1:50 PM MRN: [id number] Endoscopist: Remo Lipps P. Havery Moros , MD Age: 74 Referring MD:  Date of Birth: 1945/11/17 Gender: Male Account #: [id number] Procedure:                Colonoscopy Indications:              High risk colon cancer surveillance: Personal                            history of colonic polyps (adenoma 2015 - Dr.                            Deatra Ina) Medicines:                Monitored Anesthesia Care Procedure:                Pre-Anesthesia Assessment:                           - Prior to the procedure, a History and Physical                            was performed, and patient medications and                            allergies were reviewed. The patient's tolerance of                            previous anesthesia was also reviewed. The risks                            and benefits of the procedure and the sedation                            options and risks were discussed with the patient.                            All questions were answered, and informed consent                            was obtained. Prior Anticoagulants: The patient has                            taken no previous anticoagulant or antiplatelet                            agents. ASA Grade Assessment: III - A patient with                            severe systemic disease. After reviewing the risks                            and benefits, the patient was deemed in  satisfactory condition to undergo the procedure.                           After obtaining informed consent, the colonoscope                            was passed under direct vision. Throughout the                            procedure, the patient's blood pressure, pulse, and                            oxygen saturations were monitored continuously. The                            Colonoscope was introduced through the anus and                            advanced to the the cecum, identified by                            appendiceal orifice and ileocecal valve. The                            colonoscopy was performed without difficulty. The                            patient tolerated the procedure well. The quality                            of the bowel preparation was adequate. The                            ileocecal valve, appendiceal orifice, and rectum                            were photographed. Scope In: 2:02:43 PM Scope Out: 2:28:27 PM Scope Withdrawal Time: 0 hours 20 minutes 43 seconds  Total Procedure Duration: 0 hours 25 minutes 44 seconds  Findings:                 The perianal and digital rectal examinations were                            normal.                           Many small and large-mouthed diverticula were found                            in the entire colon.                           Three sessile polyps were found in the transverse  colon. The polyps were 3 to 5 mm in size. These                            polyps were removed with a cold snare. Resection                            and retrieval were complete.                           A 3 mm polyp was found in the descending colon. The                            polyp was sessile. The polyp was removed with a                            cold snare. Resection and retrieval were complete.                           Four sessile polyps were found in the sigmoid                            colon. The polyps were 3 to 5 mm in size. These                            polyps were removed with a cold snare. Resection                            and retrieval were complete.                           Internal hemorrhoids were found during                            retroflexion. The hemorrhoids were moderate.                           Time was taken to lavage the right colon and cecum                            cap to  achieve adequate views. The exam was                            otherwise without abnormality. Complications:            No immediate complications. Estimated blood loss:                            Minimal. Estimated Blood Loss:     Estimated blood loss was minimal. Impression:               - Diverticulosis in the entire examined colon.                           - Three 3 to 5 mm polyps  in the transverse colon,                            removed with a cold snare. Resected and retrieved.                           - One 3 mm polyp in the descending colon, removed                            with a cold snare. Resected and retrieved.                           - Four 3 to 5 mm polyps in the sigmoid colon,                            removed with a cold snare. Resected and retrieved.                           - Internal hemorrhoids.                           - The examination was otherwise normal. Recommendation:           - Patient has a contact number available for                            emergencies. The signs and symptoms of potential                            delayed complications were discussed with the                            patient. Return to normal activities tomorrow.                            Written discharge instructions were provided to the                            patient.                           - Resume previous diet.                           - Continue present medications.                           - Await pathology results. Remo Lipps P. Theophil Thivierge, MD 12/01/2019 2:33:50 PM This report has been signed electronically.

## 2019-12-01 NOTE — Progress Notes (Signed)
Temp by JB and vitals by KA Pt's states no medical or surgical changes since previsit or office visit.

## 2019-12-01 NOTE — Patient Instructions (Signed)
YOU HAD AN ENDOSCOPIC PROCEDURE TODAY AT THE [facility]:   Refer to the procedure report that was given to you for any specific questions about what was found during the examination.  If the procedure report does not answer your questions, please call your gastroenterologist to clarify.  If you requested that your care partner not be given the details of your procedure findings, then the procedure report has been included in a sealed envelope for you to review at your convenience later.  YOU SHOULD EXPECT: Some feelings of bloating in the abdomen. Passage of more gas than usual.  Walking can help get rid of the air that was put into your GI tract during the procedure and reduce the bloating. If you had a lower endoscopy (such as a colonoscopy or flexible sigmoidoscopy) you may notice spotting of blood in your stool or on the toilet paper. If you underwent a bowel prep for your procedure, you may not have a normal bowel movement for a few days.  Please Note:  You might notice some irritation and congestion in your nose or some drainage.  This is from the oxygen used during your procedure.  There is no need for concern and it should clear up in a day or so.  SYMPTOMS TO REPORT IMMEDIATELY:   Following lower endoscopy (colonoscopy or flexible sigmoidoscopy):  Excessive amounts of blood in the stool  Significant tenderness or worsening of abdominal pains  Swelling of the abdomen that is new, acute  Fever of 100F or higher  For urgent or emergent issues, a gastroenterologist can be reached at any hour by calling [phone]. Do not use MyChart messaging for urgent concerns.    DIET:  We do recommend a small meal at first, but then you may proceed to your regular diet.  Drink plenty of fluids but you should avoid alcoholic beverages for 24 hours.  ACTIVITY:  You should plan to take it easy for the rest of today and you should NOT DRIVE or use heavy machinery until tomorrow (because  of the sedation medicines used during the test).    FOLLOW UP: Our staff will call the number listed on your records 48-72 hours following your procedure to check on you and address any questions or concerns that you may have regarding the information given to you following your procedure. If we do not reach you, we will leave a message.  We will attempt to reach you two times.  During this call, we will ask if you have developed any symptoms of COVID 19. If you develop any symptoms (ie: fever, flu-like symptoms, shortness of breath, cough etc.) before then, please call [phone].  If you test positive for Covid 19 in the 2 weeks post procedure, please call and report this information to us.    If any biopsies were taken you will be contacted by phone or by letter within the next 1-3 weeks.  Please call us at [phone] if you have not heard about the biopsies in 3 weeks.    SIGNATURES/CONFIDENTIALITY: You and/or your care partner have signed paperwork which will be entered into your electronic medical record.  These signatures attest to the fact that that the information above on your After Visit Summary has been reviewed and is understood.  Full responsibility of the confidentiality of this discharge information lies with you and/or your care-partner. 

## 2019-12-03 ENCOUNTER — Telehealth: Payer: Self-pay

## 2019-12-03 NOTE — Telephone Encounter (Signed)
  Follow up Call-  Call back number 12/01/2019  Post procedure Call Back phone  # [phone]  Permission to leave phone message Yes  Some recent data might be hidden     Patient questions:  Do you have a fever, pain , or abdominal swelling? No. Pain Score  0 *  Have you tolerated food without any problems? Yes.    Have you been able to return to your normal activities? Yes.    Do you have any questions about your discharge instructions: Diet   No. Medications  No. Follow up visit  No.  Do you have questions or concerns about your Care? No.  Actions: * If pain score is 4 or above: No action needed, pain <4.   1. Have you developed a fever since your procedure? No  2.   Have you had an respiratory symptoms (SOB or cough) since your procedure? No  3.   Have you tested positive for COVID 19 since your procedure No  4.   Have you had any family members/close contacts diagnosed with the COVID 19 since your procedure?  No   If yes to any of these questions please route to Joylene John, RN and Alphonsa Gin, RN.

## 2019-12-06 ENCOUNTER — Encounter: Payer: Self-pay | Admitting: Gastroenterology

## 2020-01-10 ENCOUNTER — Telehealth: Payer: Self-pay | Admitting: Internal Medicine

## 2020-01-10 MED ORDER — TADALAFIL 20 MG PO TABS: 20.0000 mg | ORAL_TABLET | Freq: Every day | ORAL | 11 refills | Status: DC | PRN

## 2020-01-10 NOTE — Telephone Encounter (Signed)
Done erx 

## 2020-01-10 NOTE — Telephone Encounter (Signed)
    1.Medication Requested:tadalafil (CIALIS) 20 MG tablet  2. Pharmacy (Name, Street, City):[id number] [place], [place] [facility] [address]  3. On Med List: yes  4. Last Visit with PCP: 11/10/19  5. Next visit date with PCP:05/09/20   Agent: Please be advised that RX refills may take up to 3 business days. We ask that you follow-up with your pharmacy.

## 2020-02-18 ENCOUNTER — Encounter: Payer: Self-pay | Admitting: General Practice

## 2020-05-03 ENCOUNTER — Ambulatory Visit (INDEPENDENT_AMBULATORY_CARE_PROVIDER_SITE_OTHER): Payer: 59 | Admitting: Internal Medicine

## 2020-05-03 ENCOUNTER — Other Ambulatory Visit: Payer: Self-pay

## 2020-05-03 DIAGNOSIS — Z0000 Encounter for general adult medical examination without abnormal findings: Secondary | ICD-10-CM | POA: Diagnosis not present

## 2020-05-03 DIAGNOSIS — R7302 Impaired glucose tolerance (oral): Secondary | ICD-10-CM

## 2020-05-03 NOTE — Patient Instructions (Signed)
Please continue all other medications as before, and refills have been done if requested. ° °Please have the pharmacy call with any other refills you may need. ° °Please continue your efforts at being more active, low cholesterol diet, and weight control. ° °You are otherwise up to date with prevention measures today. ° °Please keep your appointments with your specialists as you may have planned ° °Please make an Appointment to return in 6 months, or sooner if needed °

## 2020-05-06 ENCOUNTER — Encounter: Payer: Self-pay | Admitting: Internal Medicine

## 2020-05-06 NOTE — Assessment & Plan Note (Signed)

## 2020-05-06 NOTE — Progress Notes (Signed)
Subjective:    Patient ID: Dillon [name], male    DOB: 08/13/1946, 74 y.o.   MRN: [id number]  HPI   Here for wellness and f/u;  Overall doing ok;  Pt denies Chest pain, worsening SOB, DOE, wheezing, orthopnea, PND, worsening LE edema, palpitations, dizziness or syncope.  Pt denies neurological change such as new headache, facial or extremity weakness.  Pt denies polydipsia, polyuria, or low sugar symptoms. Pt states overall good compliance with treatment and medications, good tolerability, and has been trying to follow appropriate diet.  Pt denies worsening depressive symptoms, suicidal ideation or panic. No fever, night sweats, wt loss, loss of appetite, or other constitutional symptoms.  Pt states good ability with ADL's, has low fall risk, home safety reviewed and adequate, no other significant changes in hearing or vision, and only occasionally active with exercise. No new complaints Past Medical History:  Diagnosis Date  . Arthritis    right shoulder  . Bladder neck obstruction 04/18/2011  . CKD (chronic kidney disease), stage III 09/12/2015  . ERECTILE DYSFUNCTION 11/15/2008  . Essential hypertension, benign 04/28/2013  . FATIGUE 11/15/2008  . GERD 11/15/2008  . History of adenomatous polyp of colon 05/06/2014   2010  . HYPERLIPIDEMIA 11/15/2008  . Impaired glucose tolerance 04/18/2011  . LEG PAIN, LEFT 11/05/2010  . LVH (left ventricular hypertrophy) 04/27/2012  . MIGRAINE, COMMON 08/06/2010  . Nocturia 12/19/2008  . Polycythemia 04/27/2012  . Swelling of limb 11/05/2010  . THROMBOCYTOSIS 12/19/2008   myleoproliferative disorder   Past Surgical History:  Procedure Laterality Date  . APPENDECTOMY    . CARDIAC CATHETERIZATION N/A 09/05/2015   Procedure: Left Heart Cath and Coronary Angiography;  Surgeon: Troy Sine, MD;  Location: [facility];  Service: Cardiovascular;  Laterality: N/A;  . CIRCUMCISION     30+yrs ago  . COLONOSCOPY    . POLYPECTOMY    . s/p esophageal dilation     . TONSILLECTOMY    . UPPER GASTROINTESTINAL ENDOSCOPY      reports that he quit smoking about 49 years ago. His smoking use included cigarettes. He has a 6.00 pack-year smoking history. He has never used smokeless tobacco. He reports current alcohol use. He reports that he does not use drugs. family history includes Cancer in his father; Hypertension in his brother, mother, and sister; Prostate cancer in his father. No Known Allergies Current Outpatient Medications on File Prior to Visit  Medication Sig Dispense Refill  . carvedilol (COREG) 12.5 MG tablet TAKE 1 TABLET (12.5 MG TOTAL) BY MOUTH 2 (TWO) TIMES DAILY WITH A MEAL. 60 tablet 5  . fluticasone (FLONASE) 50 MCG/ACT nasal spray Place 2 sprays into both nostrils daily. 16 g 6  . furosemide (LASIX) 20 MG tablet TAKE 1 TABLET BY MOUTH DAILY AS NEEDED FOR EDEMA.PLEASE CALL TO SCHEDULE OVERDUE APPT WITH DR KELLEY 30 tablet 0  . hydroxyurea (HYDREA) 500 MG capsule Take 1 capsule (500 mg total) by mouth daily. May take with food to minimize GI side effects. 180 capsule 3  . lisinopril (ZESTRIL) 2.5 MG tablet Take 1 tablet (2.5 mg total) by mouth daily. 90 tablet 3  . lovastatin (MEVACOR) 40 MG tablet Take 1 tablet (40 mg total) by mouth daily. 90 tablet 3  . tamsulosin (FLOMAX) 0.4 MG CAPS capsule TAKE 1 CAPSULE BY MOUTH EVERY DAY 90 capsule 1  . Vitamin D, Ergocalciferol, (DRISDOL) 1.25 MG (50000 UT) CAPS capsule Take 1 capsule (50,000 Units total) by mouth every  7 (seven) days. 12 capsule 0  . tadalafil (CIALIS) 20 MG tablet Take 1 tablet (20 mg total) by mouth daily as needed for erectile dysfunction. 40 tablet 11   No current facility-administered medications on file prior to visit.   Review of Systems All otherwise neg per pt     Objective:   Physical Exam BP 124/82   Pulse 75   Ht 5\' 9"  (1.753 m)   Wt 178 lb (80.7 kg)   SpO2 98%   BMI 26.29 kg/m  VS noted,  Constitutional: Pt appears in NAD HENT: Head: NCAT.  Right Ear:  External ear normal.  Left Ear: External ear normal.  Eyes: . Pupils are equal, round, and reactive to light. Conjunctivae and EOM are normal Nose: without d/c or deformity Neck: Neck supple. Gross normal ROM Cardiovascular: Normal rate and regular rhythm.   Pulmonary/Chest: Effort normal and breath sounds without rales or wheezing.  Abd:  Soft, NT, ND, + BS, no organomegaly Neurological: Pt is alert. At baseline orientation, motor grossly intact Skin: Skin is warm. No rashes, other new lesions, no LE edema Psychiatric: Pt behavior is normal without agitation  All otherwise neg per pt Lab Results  Component Value Date   WBC 18.5 Repeated and verified X2. (HH) 11/10/2019   HGB 20.4 Repeated and verified X2. (HH) 11/10/2019   HCT 66.2 Repeated and verified X2. (HH) 11/10/2019   PLT 1049.0 Repeated and verified X2. (HH) 11/10/2019   GLUCOSE 72 11/10/2019   CHOL 143 11/10/2019   TRIG 81.0 11/10/2019   HDL 46.90 11/10/2019   LDLDIRECT 57.0 12/15/2017   LDLCALC 80 11/10/2019   ALT 18 11/10/2019   AST 16 11/10/2019   NA 140 11/10/2019   K 5.3 No hemolysis seen (H) 11/10/2019   CL 105 11/10/2019   CREATININE 1.23 11/10/2019   BUN 23 11/10/2019   CO2 22 11/10/2019   TSH 3.11 11/10/2019   PSA 1.81 11/10/2019   INR 1.32 09/04/2015   HGBA1C 5.6 11/10/2019      Assessment & Plan:

## 2020-05-06 NOTE — Assessment & Plan Note (Signed)
stable overall by history and exam, recent data reviewed with pt, and pt to continue medical treatment as before,  to f/u any worsening symptoms or concerns  

## 2020-05-09 ENCOUNTER — Ambulatory Visit: Payer: 59 | Admitting: Internal Medicine

## 2020-05-12 ENCOUNTER — Other Ambulatory Visit: Payer: Self-pay | Admitting: Internal Medicine

## 2020-05-26 ENCOUNTER — Inpatient Hospital Stay: Payer: 59 | Attending: Oncology | Admitting: Oncology

## 2020-05-26 ENCOUNTER — Inpatient Hospital Stay: Payer: 59

## 2020-09-18 ENCOUNTER — Other Ambulatory Visit: Payer: Self-pay | Admitting: Internal Medicine

## 2020-09-18 NOTE — Telephone Encounter (Signed)
Please refill as per office routine med refill policy (all routine meds refilled for 3 mo or monthly per pt preference up to one year from last visit, then month to month grace period for 3 mo, then further med refills will have to be denied)  

## 2020-09-20 ENCOUNTER — Telehealth: Payer: Self-pay

## 2020-09-20 NOTE — Telephone Encounter (Signed)
PA- SENT awaiting determination.   (Key: [id number]) - [id number] Tadalafil 20MG  tablets

## 2020-10-12 NOTE — Telephone Encounter (Signed)
Medication requesting PA is expired.  Will send to PCP for clarification.

## 2020-10-12 NOTE — Telephone Encounter (Signed)
I think we can hold for now

## 2020-10-13 NOTE — Addendum Note (Signed)
Addended by: Elza Rafter D on: 10/13/2020 09:20 AM   Modules accepted: Orders

## 2020-10-13 NOTE — Telephone Encounter (Signed)
Medication removed from med list due to expired prescription.

## 2020-11-07 ENCOUNTER — Ambulatory Visit (INDEPENDENT_AMBULATORY_CARE_PROVIDER_SITE_OTHER): Payer: 59 | Admitting: Internal Medicine

## 2020-11-07 ENCOUNTER — Other Ambulatory Visit: Payer: Self-pay

## 2020-11-07 ENCOUNTER — Encounter: Payer: Self-pay | Admitting: Internal Medicine

## 2020-11-07 ENCOUNTER — Other Ambulatory Visit (INDEPENDENT_AMBULATORY_CARE_PROVIDER_SITE_OTHER): Payer: 59

## 2020-11-07 VITALS — BP 132/68 | HR 64 | Temp 98.8°F | Resp 18 | Ht 69.0 in | Wt 177.0 lb

## 2020-11-07 DIAGNOSIS — E7800 Pure hypercholesterolemia, unspecified: Secondary | ICD-10-CM

## 2020-11-07 DIAGNOSIS — Z0000 Encounter for general adult medical examination without abnormal findings: Secondary | ICD-10-CM | POA: Diagnosis not present

## 2020-11-07 DIAGNOSIS — Z0001 Encounter for general adult medical examination with abnormal findings: Secondary | ICD-10-CM

## 2020-11-07 DIAGNOSIS — N1831 Chronic kidney disease, stage 3a: Secondary | ICD-10-CM

## 2020-11-07 DIAGNOSIS — E559 Vitamin D deficiency, unspecified: Secondary | ICD-10-CM | POA: Diagnosis not present

## 2020-11-07 DIAGNOSIS — D75839 Thrombocytosis, unspecified: Secondary | ICD-10-CM | POA: Diagnosis not present

## 2020-11-07 DIAGNOSIS — E538 Deficiency of other specified B group vitamins: Secondary | ICD-10-CM

## 2020-11-07 DIAGNOSIS — R7302 Impaired glucose tolerance (oral): Secondary | ICD-10-CM

## 2020-11-07 MED ORDER — TADALAFIL 20 MG PO TABS: 10.0000 mg | ORAL_TABLET | ORAL | 11 refills | Status: DC | PRN

## 2020-11-07 NOTE — Patient Instructions (Signed)
Please take all new medication as prescribed - the cialis  Please continue all other medications as before, and refills have been done if requested.  Please have the pharmacy call with any other refills you may need.  Please continue your efforts at being more active, low cholesterol diet, and weight control.  You are otherwise up to date with prevention measures today.  Please keep your appointments with your specialists as you may have planned  You will be contacted regarding the referral for:  Dr Alen Blew  Please go to the LAB at the blood drawing area for the tests to be done - at the [facility] lab today  You will be contacted by phone if any changes need to be made immediately.  Otherwise, you will receive a letter about your results with an explanation, but please check with MyChart first.  Please remember to sign up for MyChart if you have not done so, as this will be important to you in the future with finding out test results, communicating by private email, and scheduling acute appointments online when needed.  Please make an Appointment to return in 6 months, or sooner if needed

## 2020-11-07 NOTE — Progress Notes (Signed)
Patient ID: Dillon [name], male   DOB: 1945/10/12, 75 y.o.   MRN: [id number]         Chief Complaint:: wellness exam and ED and elevated Plts       HPI:  Dillon [name] is a 75 y.o. male here for wellness exam; /w up to date with immunizations and preventive referrals                        Also asks for ED med refill, and due for elevated platelet referral to f/u; Pt denies chest pain, increased sob or doe, wheezing, orthopnea, PND, increased LE swelling, palpitations, dizziness or syncope.   Pt denies polydipsia, polyuria, Denies new neuro focal s/s.  Pt denies fever, wt loss, night sweats, loss of appetite, or other constitutional symptoms   Wt Readings from Last 3 Encounters:  11/07/20 177 lb (80.3 kg)  05/03/20 178 lb (80.7 kg)  12/01/19 170 lb (77.1 kg)   BP Readings from Last 3 Encounters:  11/07/20 132/68  05/03/20 124/82  12/01/19 (!) 145/93   Immunization History  Administered Date(s) Administered  . H1N1 11/15/2008  . Influenza Split 07/28/2012  . Influenza Whole 11/29/2009  . Influenza, High Dose Seasonal PF 05/18/2015, 05/01/2017, 06/16/2018, 05/06/2019, 05/12/2020  . Influenza, Seasonal, Injecte, Preservative Fre 06/16/2013  . Influenza,inj,Quad PF,6+ Mos 05/06/2014  . Influenza-Unspecified 05/12/2017, 05/08/2019  . PFIZER(Purple Top)SARS-COV-2 Vaccination 10/16/2019, 11/05/2019, 06/18/2020  . Pneumococcal Conjugate-13 07/29/2013  . Pneumococcal Polysaccharide-23 03/04/2007, 05/04/2015  . Td 11/15/2008  . Tdap 05/14/2019  . Zoster 03/04/2007  . Zoster Recombinat (Shingrix) 05/12/2020  There are no preventive care reminders to display for this patient.    Past Medical History:  Diagnosis Date  . Arthritis    right shoulder  . Bladder neck obstruction 04/18/2011  . CKD (chronic kidney disease), stage III ([place]) 09/12/2015  . ERECTILE DYSFUNCTION 11/15/2008  . Essential hypertension, benign 04/28/2013  . FATIGUE 11/15/2008  . GERD 11/15/2008  . History of  adenomatous polyp of colon 05/06/2014   2010  . HYPERLIPIDEMIA 11/15/2008  . Impaired glucose tolerance 04/18/2011  . LEG PAIN, LEFT 11/05/2010  . LVH (left ventricular hypertrophy) 04/27/2012  . MIGRAINE, COMMON 08/06/2010  . Nocturia 12/19/2008  . Polycythemia 04/27/2012  . Swelling of limb 11/05/2010  . THROMBOCYTOSIS 12/19/2008   myleoproliferative disorder   Past Surgical History:  Procedure Laterality Date  . APPENDECTOMY    . CARDIAC CATHETERIZATION N/A 09/05/2015   Procedure: Left Heart Cath and Coronary Angiography;  Surgeon: Troy Sine, MD;  Location: [facility];  Service: Cardiovascular;  Laterality: N/A;  . CIRCUMCISION     30+yrs ago  . COLONOSCOPY    . POLYPECTOMY    . s/p esophageal dilation    . TONSILLECTOMY    . UPPER GASTROINTESTINAL ENDOSCOPY      reports that he quit smoking about 50 years ago. His smoking use included cigarettes. He has a 6.00 pack-year smoking history. He has never used smokeless tobacco. He reports current alcohol use. He reports that he does not use drugs. family history includes Cancer in his father; Hypertension in his brother, mother, and sister; Prostate cancer in his father. No Known Allergies Current Outpatient Medications on File Prior to Visit  Medication Sig Dispense Refill  . carvedilol (COREG) 12.5 MG tablet TAKE 1 TABLET (12.5 MG TOTAL) BY MOUTH 2 (TWO) TIMES DAILY WITH A MEAL. 60 tablet 5  . fluticasone (FLONASE) 50 MCG/ACT nasal spray  Place 2 sprays into both nostrils daily. 16 g 6  . furosemide (LASIX) 20 MG tablet TAKE 1 TABLET BY MOUTH DAILY AS NEEDED FOR EDEMA.PLEASE CALL TO SCHEDULE OVERDUE APPT WITH DR KELLEY 30 tablet 0  . hydroxyurea (HYDREA) 500 MG capsule Take 1 capsule (500 mg total) by mouth daily. May take with food to minimize GI side effects. 180 capsule 3  . lisinopril (ZESTRIL) 2.5 MG tablet TAKE 1 TABLET BY MOUTH EVERY DAY 90 tablet 3  . lovastatin (MEVACOR) 40 MG tablet TAKE 1 TABLET BY MOUTH EVERY DAY 90  tablet 3  . tamsulosin (FLOMAX) 0.4 MG CAPS capsule TAKE 1 CAPSULE BY MOUTH EVERY DAY 90 capsule 1  . Vitamin D, Ergocalciferol, (DRISDOL) 1.25 MG (50000 UT) CAPS capsule Take 1 capsule (50,000 Units total) by mouth every 7 (seven) days. 12 capsule 0   No current facility-administered medications on file prior to visit.        ROS:  All others reviewed and negative.  Objective        PE:  BP 132/68   Pulse 64   Temp 98.8 F (37.1 C) (Oral)   Resp 18   Ht 5\' 9"  (1.753 m)   Wt 177 lb (80.3 kg)   BMI 26.14 kg/m                 Constitutional: Pt appears in NAD               HENT: Head: NCAT.                Right Ear: External ear normal.                 Left Ear: External ear normal.                Eyes: . Pupils are equal, round, and reactive to light. Conjunctivae and EOM are normal               Nose: without d/c or deformity               Neck: Neck supple. Gross normal ROM               Cardiovascular: Normal rate and regular rhythm.                 Pulmonary/Chest: Effort normal and breath sounds without rales or wheezing.                Abd:  Soft, NT, ND, + BS, no organomegaly               Neurological: Pt is alert. At baseline orientation, motor grossly intact               Skin: Skin is warm. No rashes, no other new lesions, LE edema - none               Psychiatric: Pt behavior is normal without agitation   Micro: none  Cardiac tracings I have personally interpreted today:  none  Pertinent Radiological findings (summarize): none   Lab Results  Component Value Date   WBC 19.0 Repeated and verified X2. (HH) 11/07/2020   HGB 17.9 Repeated and verified X2. (H) 11/07/2020   HCT 59.1 Repeated and verified X2. (H) 11/07/2020   PLT 718.0 (H) 11/07/2020   GLUCOSE 76 11/07/2020   CHOL 146 11/07/2020   TRIG 143.0 11/07/2020   HDL 59.40 11/07/2020   LDLDIRECT 57.0 12/15/2017  LDLCALC 58 11/07/2020   ALT 17 11/07/2020   AST 25 11/07/2020   NA 140 11/07/2020   K 5.2  No hemolysis seen (H) 11/07/2020   CL 101 11/07/2020   CREATININE 1.42 11/07/2020   BUN 23 11/07/2020   CO2 28 11/07/2020   TSH 4.38 11/07/2020   PSA 1.80 11/07/2020   INR 1.32 09/04/2015   HGBA1C 5.2 11/07/2020   Assessment/Plan:  Dillon [name] is a 75 y.o. Black or African American [2] male with  has a past medical history of Arthritis, Bladder neck obstruction (04/18/2011), CKD (chronic kidney disease), stage III ([place]) (09/12/2015), ERECTILE DYSFUNCTION (11/15/2008), Essential hypertension, benign (04/28/2013), FATIGUE (11/15/2008), GERD (11/15/2008), History of adenomatous polyp of colon (05/06/2014), HYPERLIPIDEMIA (11/15/2008), Impaired glucose tolerance (04/18/2011), LEG PAIN, LEFT (11/05/2010), LVH (left ventricular hypertrophy) (04/27/2012), MIGRAINE, COMMON (08/06/2010), Nocturia (12/19/2008), Polycythemia (04/27/2012), Swelling of limb (11/05/2010), and THROMBOCYTOSIS (12/19/2008).  Thrombocytosis ([place]) Pt overdue for f/u elevated plts, is taking the hydroxyurea, for referral back to heme onc for f/u  Encounter for well adult exam with abnormal findings Age and sex appropriate education and counseling updated with regular exercise and diet Referrals for preventative services - none needed Immunizations addressed - none needed Smoking counseling  - none needed Evidence for depression or other mood disorder - none significant Most recent labs reviewed. I have personally reviewed and have noted: 1) the patient's medical and social history 2) The patient's current medications and supplements 3) The patient's height, weight, and BMI have been recorded in the chart   CKD (chronic kidney disease), stage III Lab Results  Component Value Date   CREATININE 1.42 11/07/2020   Stable overall, cont to avoid nephrotoxins   Hyperlipidemia Lab Results  Component Value Date   LDLCALC 58 11/07/2020   Stable, pt to continue current statin mevacor 40   Current Outpatient Medications  (Cardiovascular):  .  carvedilol (COREG) 12.5 MG tablet, TAKE 1 TABLET (12.5 MG TOTAL) BY MOUTH 2 (TWO) TIMES DAILY WITH A MEAL. .  furosemide (LASIX) 20 MG tablet, TAKE 1 TABLET BY MOUTH DAILY AS NEEDED FOR EDEMA.PLEASE CALL TO SCHEDULE OVERDUE APPT WITH DR Georgina Peer .  lisinopril (ZESTRIL) 2.5 MG tablet, TAKE 1 TABLET BY MOUTH EVERY DAY .  lovastatin (MEVACOR) 40 MG tablet, TAKE 1 TABLET BY MOUTH EVERY DAY .  tadalafil (CIALIS) 20 MG tablet, Take 0.5-1 tablets (10-20 mg total) by mouth every other day as needed for erectile dysfunction.  Current Outpatient Medications (Respiratory):  .  fluticasone (FLONASE) 50 MCG/ACT nasal spray, Place 2 sprays into both nostrils daily.    Current Outpatient Medications (Other):  .  hydroxyurea (HYDREA) 500 MG capsule, Take 1 capsule (500 mg total) by mouth daily. May take with food to minimize GI side effects. .  tamsulosin (FLOMAX) 0.4 MG CAPS capsule, TAKE 1 CAPSULE BY MOUTH EVERY DAY .  Vitamin D, Ergocalciferol, (DRISDOL) 1.25 MG (50000 UT) CAPS capsule, Take 1 capsule (50,000 Units total) by mouth every 7 (seven) days.   Impaired glucose tolerance Lab Results  Component Value Date   HGBA1C 5.2 11/07/2020   Stable, pt to continue current medical treatment  - diet   Followup: Return in about 6 months (around 05/07/2021).  Cathlean Cower, MD 11/13/2020 10:20 PM [facility]

## 2020-11-08 ENCOUNTER — Telehealth: Payer: Self-pay

## 2020-11-08 ENCOUNTER — Encounter: Payer: Self-pay | Admitting: Internal Medicine

## 2020-11-08 LAB — CBC WITH DIFFERENTIAL/PLATELET
Basophils Absolute: 0.1 10*3/uL (ref 0.0–0.1)
Basophils Relative: 0.7 % (ref 0.0–3.0)
Eosinophils Absolute: 0.4 10*3/uL (ref 0.0–0.7)
Eosinophils Relative: 2.1 % (ref 0.0–5.0)
HCT: 59.1 % — ABNORMAL HIGH (ref 39.0–52.0)
Hemoglobin: 17.9 g/dL — ABNORMAL HIGH (ref 13.0–17.0)
Lymphocytes Relative: 10.8 % — ABNORMAL LOW (ref 12.0–46.0)
Lymphs Abs: 2 10*3/uL (ref 0.7–4.0)
MCHC: 30.3 g/dL (ref 30.0–36.0)
MCV: 83.8 fl (ref 78.0–100.0)
Monocytes Absolute: 0.9 10*3/uL (ref 0.1–1.0)
Monocytes Relative: 4.7 % (ref 3.0–12.0)
Neutro Abs: 15.5 10*3/uL — ABNORMAL HIGH (ref 1.4–7.7)
Neutrophils Relative %: 81.7 % — ABNORMAL HIGH (ref 43.0–77.0)
Platelets: 718 10*3/uL — ABNORMAL HIGH (ref 150.0–400.0)
RBC: 7.05 Mil/uL — ABNORMAL HIGH (ref 4.22–5.81)
RDW: 19.1 % — ABNORMAL HIGH (ref 11.5–15.5)
WBC: 19 10*3/uL (ref 4.0–10.5)

## 2020-11-08 LAB — URINALYSIS, ROUTINE W REFLEX MICROSCOPIC
Bilirubin Urine: NEGATIVE
Hgb urine dipstick: NEGATIVE
Ketones, ur: NEGATIVE
Leukocytes,Ua: NEGATIVE
Nitrite: NEGATIVE
Specific Gravity, Urine: 1.025 (ref 1.000–1.030)
Total Protein, Urine: NEGATIVE
Urine Glucose: NEGATIVE
Urobilinogen, UA: 0.2 (ref 0.0–1.0)
pH: 6 (ref 5.0–8.0)

## 2020-11-08 LAB — LIPID PANEL
Cholesterol: 146 mg/dL (ref 0–200)
HDL: 59.4 mg/dL (ref >39.00)
LDL Cholesterol: 58 mg/dL (ref 0–99)
NonHDL: 86.9
Total CHOL/HDL Ratio: 2
Triglycerides: 143 mg/dL (ref 0.0–149.0)
VLDL: 28.6 mg/dL (ref 0.0–40.0)

## 2020-11-08 LAB — HEPATIC FUNCTION PANEL
ALT: 17 U/L (ref 0–53)
AST: 25 U/L (ref 0–37)
Albumin: 4.8 g/dL (ref 3.5–5.2)
Alkaline Phosphatase: 79 U/L (ref 39–117)
Bilirubin, Direct: 0.2 mg/dL (ref 0.0–0.3)
Total Bilirubin: 1.2 mg/dL (ref 0.2–1.2)
Total Protein: 7.7 g/dL (ref 6.0–8.3)

## 2020-11-08 LAB — BASIC METABOLIC PANEL
BUN: 23 mg/dL (ref 6–23)
CO2: 28 mEq/L (ref 19–32)
Calcium: 10.6 mg/dL — ABNORMAL HIGH (ref 8.4–10.5)
Chloride: 101 mEq/L (ref 96–112)
Creatinine, Ser: 1.42 mg/dL (ref 0.40–1.50)
GFR: 47.8 mL/min — ABNORMAL LOW (ref >60.00)
Glucose, Bld: 76 mg/dL (ref 70–99)
Potassium: 5.2 mEq/L — ABNORMAL HIGH (ref 3.5–5.1)
Sodium: 140 mEq/L (ref 135–145)

## 2020-11-08 LAB — VITAMIN D 25 HYDROXY (VIT D DEFICIENCY, FRACTURES): VITD: 34.47 ng/mL (ref 30.00–100.00)

## 2020-11-08 LAB — HEMOGLOBIN A1C: Hgb A1c MFr Bld: 5.2 % (ref 4.6–6.5)

## 2020-11-08 LAB — PSA: PSA: 1.8 ng/mL (ref 0.10–4.00)

## 2020-11-08 LAB — TSH: TSH: 4.38 u[IU]/mL (ref 0.35–4.50)

## 2020-11-08 LAB — VITAMIN B12: Vitamin B-12: 253 pg/mL (ref 211–911)

## 2020-11-08 NOTE — Telephone Encounter (Signed)
CRITICAL VALUE STICKER  CRITICAL VALUE: WBC 19  RECEIVER (on-site recipient of call): Elza Rafter rnc  [place] NOTIFIED: 11/08/20 at 1048  MESSENGER (representative from lab): Santiago Glad  MD NOTIFIED: Dr Jenny Reichmann  TIME OF NOTIFICATION: 1051  RESPONSE: Awaiting response

## 2020-11-09 ENCOUNTER — Telehealth: Payer: Self-pay | Admitting: Oncology

## 2020-11-09 NOTE — Telephone Encounter (Signed)
Scheduled appt per 2/23 sch msg - mailed letter with appt date and time

## 2020-11-13 ENCOUNTER — Encounter: Payer: Self-pay | Admitting: Internal Medicine

## 2020-11-13 NOTE — Assessment & Plan Note (Signed)
Lab Results  Component Value Date   LDLCALC 58 11/07/2020   Stable, pt to continue current statin mevacor 40   Current Outpatient Medications (Cardiovascular):  .  carvedilol (COREG) 12.5 MG tablet, TAKE 1 TABLET (12.5 MG TOTAL) BY MOUTH 2 (TWO) TIMES DAILY WITH A MEAL. .  furosemide (LASIX) 20 MG tablet, TAKE 1 TABLET BY MOUTH DAILY AS NEEDED FOR EDEMA.PLEASE CALL TO SCHEDULE OVERDUE APPT WITH DR Georgina Peer .  lisinopril (ZESTRIL) 2.5 MG tablet, TAKE 1 TABLET BY MOUTH EVERY DAY .  lovastatin (MEVACOR) 40 MG tablet, TAKE 1 TABLET BY MOUTH EVERY DAY .  tadalafil (CIALIS) 20 MG tablet, Take 0.5-1 tablets (10-20 mg total) by mouth every other day as needed for erectile dysfunction.  Current Outpatient Medications (Respiratory):  .  fluticasone (FLONASE) 50 MCG/ACT nasal spray, Place 2 sprays into both nostrils daily.    Current Outpatient Medications (Other):  .  hydroxyurea (HYDREA) 500 MG capsule, Take 1 capsule (500 mg total) by mouth daily. May take with food to minimize GI side effects. .  tamsulosin (FLOMAX) 0.4 MG CAPS capsule, TAKE 1 CAPSULE BY MOUTH EVERY DAY .  Vitamin D, Ergocalciferol, (DRISDOL) 1.25 MG (50000 UT) CAPS capsule, Take 1 capsule (50,000 Units total) by mouth every 7 (seven) days.

## 2020-11-13 NOTE — Assessment & Plan Note (Signed)
Lab Results  Component Value Date   HGBA1C 5.2 11/07/2020   Stable, pt to continue current medical treatment  - diet

## 2020-11-13 NOTE — Assessment & Plan Note (Signed)
Pt overdue for f/u elevated plts, is taking the hydroxyurea, for referral back to heme onc for f/u

## 2020-11-13 NOTE — Assessment & Plan Note (Signed)

## 2020-11-13 NOTE — Assessment & Plan Note (Signed)
Lab Results  Component Value Date   CREATININE 1.42 11/07/2020   Stable overall, cont to avoid nephrotoxins

## 2020-11-20 ENCOUNTER — Telehealth: Payer: Self-pay

## 2020-11-20 NOTE — Telephone Encounter (Signed)
Prior Authorization approved for Tadalafil until 11/17/21  Approval number [id number]

## 2020-12-07 ENCOUNTER — Inpatient Hospital Stay: Payer: 59 | Attending: Oncology | Admitting: Oncology

## 2020-12-07 ENCOUNTER — Inpatient Hospital Stay: Payer: 59

## 2020-12-11 ENCOUNTER — Telehealth: Payer: Self-pay | Admitting: Oncology

## 2020-12-11 NOTE — Telephone Encounter (Signed)
Scheduled appt per 3/28 sch msg. Pt aware.  

## 2021-01-01 ENCOUNTER — Other Ambulatory Visit: Payer: Self-pay

## 2021-01-02 ENCOUNTER — Ambulatory Visit (INDEPENDENT_AMBULATORY_CARE_PROVIDER_SITE_OTHER): Payer: 59 | Admitting: Internal Medicine

## 2021-01-02 ENCOUNTER — Encounter: Payer: Self-pay | Admitting: Internal Medicine

## 2021-01-02 DIAGNOSIS — R7302 Impaired glucose tolerance (oral): Secondary | ICD-10-CM | POA: Diagnosis not present

## 2021-01-02 DIAGNOSIS — I10 Essential (primary) hypertension: Secondary | ICD-10-CM

## 2021-01-02 DIAGNOSIS — M545 Low back pain, unspecified: Secondary | ICD-10-CM | POA: Insufficient documentation

## 2021-01-02 LAB — URINALYSIS, ROUTINE W REFLEX MICROSCOPIC
Bilirubin Urine: NEGATIVE
Hgb urine dipstick: NEGATIVE
Leukocytes,Ua: NEGATIVE
Nitrite: NEGATIVE
Specific Gravity, Urine: 1.025 (ref 1.000–1.030)
Total Protein, Urine: NEGATIVE
Urine Glucose: NEGATIVE
Urobilinogen, UA: 0.2 (ref 0.0–1.0)
pH: 5.5 (ref 5.0–8.0)

## 2021-01-02 MED ORDER — TIZANIDINE HCL 2 MG PO TABS
2.0000 mg | ORAL_TABLET | Freq: Four times a day (QID) | ORAL | 0 refills | Status: AC | PRN
Start: 2021-01-02 — End: ?

## 2021-01-02 NOTE — Patient Instructions (Signed)
Please take all new medication as prescribed - the muscle relaxer only if needed  Please continue to monitor your BP at home, with the goal to be at least less than 140/90  Please continue all other medications as before, and refills have been done if requested.  Please have the pharmacy call with any other refills you may need.  Please keep your appointments with your specialists as you may have planned  Please go to the LAB at the blood drawing area for the tests to be done - just the urine testing today  You will be contacted by phone if any changes need to be made immediately.  Otherwise, you will receive a letter about your results with an explanation, but please check with MyChart first.  Please remember to sign up for MyChart if you have not done so, as this will be important to you in the future with finding out test results, communicating by private email, and scheduling acute appointments online when needed.

## 2021-01-02 NOTE — Progress Notes (Signed)
Patient ID: Dillon [name], male   DOB: 02/23/1946, 75 y.o.   MRN: [id number]        Chief Complaint: right low back pain       HPI:  Dillon [name] is a 75 y.o. male here with c/o right low back pain twice in the past wk, sharp and dull, worse to stand up and bend, better to lie down, mild intermittent, and denies bowel or bladder change, fever, wt loss,  worsening LE pain/numbness/weakness, gait change or falls.  Denies urinary symptoms such as dysuria, frequency, urgency, flank pain, hematuria or n/v, fever, chills.  Denies worsening reflux, abd pain, dysphagia, n/v, bowel change or blood.  Pt denies chest pain, increased sob or doe, wheezing, orthopnea, PND, increased LE swelling, palpitations, dizziness or syncope.   Pt denies fever, wt loss, night sweats, loss of appetite, or other constitutional symptoms   Pt denies polydipsia, polyuria,     Wt Readings from Last 3 Encounters:  01/02/21 178 lb (80.7 kg)  11/07/20 177 lb (80.3 kg)  05/03/20 178 lb (80.7 kg)   BP Readings from Last 3 Encounters:  01/02/21 (!) 166/88  11/07/20 132/68  05/03/20 124/82         Past Medical History:  Diagnosis Date  . Arthritis    right shoulder  . Bladder neck obstruction 04/18/2011  . CKD (chronic kidney disease), stage III ([place]) 09/12/2015  . ERECTILE DYSFUNCTION 11/15/2008  . Essential hypertension, benign 04/28/2013  . FATIGUE 11/15/2008  . GERD 11/15/2008  . History of adenomatous polyp of colon 05/06/2014   2010  . HYPERLIPIDEMIA 11/15/2008  . Impaired glucose tolerance 04/18/2011  . LEG PAIN, LEFT 11/05/2010  . LVH (left ventricular hypertrophy) 04/27/2012  . MIGRAINE, COMMON 08/06/2010  . Nocturia 12/19/2008  . Polycythemia 04/27/2012  . Swelling of limb 11/05/2010  . THROMBOCYTOSIS 12/19/2008   myleoproliferative disorder   Past Surgical History:  Procedure Laterality Date  . APPENDECTOMY    . CARDIAC CATHETERIZATION N/A 09/05/2015   Procedure: Left Heart Cath and Coronary Angiography;   Surgeon: Troy Sine, MD;  Location: [facility];  Service: Cardiovascular;  Laterality: N/A;  . CIRCUMCISION     30+yrs ago  . COLONOSCOPY    . POLYPECTOMY    . s/p esophageal dilation    . TONSILLECTOMY    . UPPER GASTROINTESTINAL ENDOSCOPY      reports that he quit smoking about 50 years ago. His smoking use included cigarettes. He has a 6.00 pack-year smoking history. He has never used smokeless tobacco. He reports current alcohol use. He reports that he does not use drugs. family history includes Cancer in his father; Hypertension in his brother, mother, and sister; Prostate cancer in his father. No Known Allergies Current Outpatient Medications on File Prior to Visit  Medication Sig Dispense Refill  . carvedilol (COREG) 12.5 MG tablet TAKE 1 TABLET (12.5 MG TOTAL) BY MOUTH 2 (TWO) TIMES DAILY WITH A MEAL. 60 tablet 5  . fluticasone (FLONASE) 50 MCG/ACT nasal spray Place 2 sprays into both nostrils daily. 16 g 6  . furosemide (LASIX) 20 MG tablet TAKE 1 TABLET BY MOUTH DAILY AS NEEDED FOR EDEMA.PLEASE CALL TO SCHEDULE OVERDUE APPT WITH DR KELLEY 30 tablet 0  . lisinopril (ZESTRIL) 2.5 MG tablet TAKE 1 TABLET BY MOUTH EVERY DAY 90 tablet 3  . lovastatin (MEVACOR) 40 MG tablet TAKE 1 TABLET BY MOUTH EVERY DAY 90 tablet 3  . tadalafil (CIALIS) 20 MG tablet Take  0.5-1 tablets (10-20 mg total) by mouth every other day as needed for erectile dysfunction. 10 tablet 11  . tamsulosin (FLOMAX) 0.4 MG CAPS capsule TAKE 1 CAPSULE BY MOUTH EVERY DAY 90 capsule 1  . Vitamin D, Ergocalciferol, (DRISDOL) 1.25 MG (50000 UT) CAPS capsule Take 1 capsule (50,000 Units total) by mouth every 7 (seven) days. 12 capsule 0   No current facility-administered medications on file prior to visit.        ROS:  All others reviewed and negative.  Objective        PE:  BP (!) 166/88 (BP Location: Left Arm, Patient Position: Sitting, Cuff Size: Normal)   Pulse 85   Temp 98.4 F (36.9 C) (Oral)   Ht 5'  9" (1.753 m)   Wt 178 lb (80.7 kg)   SpO2 96%   BMI 26.29 kg/m                 Constitutional: Pt appears in NAD               HENT: Head: NCAT.                Right Ear: External ear normal.                 Left Ear: External ear normal.                Eyes: . Pupils are equal, round, and reactive to light. Conjunctivae and EOM are normal               Nose: without d/c or deformity               Neck: Neck supple. Gross normal ROM               Cardiovascular: Normal rate and regular rhythm.                 Pulmonary/Chest: Effort normal and breath sounds without rales or wheezing.                Abd:  Soft, NT, ND, + BS, no organomegaly               Neurological: Pt is alert. At baseline orientation, motor grossly intact; spine notender, has no lumbar paravertebral tender or rash               Skin: Skin is warm. No rashes, no other new lesions, LE edema - none               Psychiatric: Pt behavior is normal without agitation   Micro: none  Cardiac tracings I have personally interpreted today:  none  Pertinent Radiological findings (summarize): none   Lab Results  Component Value Date   WBC 19.0 Repeated and verified X2. (HH) 11/07/2020   HGB 17.9 Repeated and verified X2. (H) 11/07/2020   HCT 59.1 Repeated and verified X2. (H) 11/07/2020   PLT 718.0 (H) 11/07/2020   GLUCOSE 76 11/07/2020   CHOL 146 11/07/2020   TRIG 143.0 11/07/2020   HDL 59.40 11/07/2020   LDLDIRECT 57.0 12/15/2017   LDLCALC 58 11/07/2020   ALT 17 11/07/2020   AST 25 11/07/2020   NA 140 11/07/2020   K 5.2 No hemolysis seen (H) 11/07/2020   CL 101 11/07/2020   CREATININE 1.42 11/07/2020   BUN 23 11/07/2020   CO2 28 11/07/2020   TSH 4.38 11/07/2020   PSA 1.80 11/07/2020   INR 1.32  09/04/2015   HGBA1C 5.2 11/07/2020   Assessment/Plan:  Dillon [name] is a 75 y.o. Black or African American [2] male with  has a past medical history of Arthritis, Bladder neck obstruction (04/18/2011), CKD  (chronic kidney disease), stage III ([place]) (09/12/2015), ERECTILE DYSFUNCTION (11/15/2008), Essential hypertension, benign (04/28/2013), FATIGUE (11/15/2008), GERD (11/15/2008), History of adenomatous polyp of colon (05/06/2014), HYPERLIPIDEMIA (11/15/2008), Impaired glucose tolerance (04/18/2011), LEG PAIN, LEFT (11/05/2010), LVH (left ventricular hypertrophy) (04/27/2012), MIGRAINE, COMMON (08/06/2010), Nocturia (12/19/2008), Polycythemia (04/27/2012), Swelling of limb (11/05/2010), and THROMBOCYTOSIS (12/19/2008).  Lower back pain C/w msk strain prn,  to f/u any worsening symptoms or concerns, check ua, for tizanidine prn  Essential hypertension, benign Mild elevated, likely reactive, pt reassured, and f/u bp at home and next visit  Impaired glucose tolerance Lab Results  Component Value Date   HGBA1C 5.2 11/07/2020   Stable, pt to continue current medical treatment  - diet   Followup: Return if symptoms worsen or fail to improve.  Cathlean Cower, MD 01/07/2021 8:41 PM [facility]

## 2021-01-03 ENCOUNTER — Other Ambulatory Visit: Payer: Self-pay | Admitting: Oncology

## 2021-01-07 ENCOUNTER — Encounter: Payer: Self-pay | Admitting: Internal Medicine

## 2021-01-07 NOTE — Assessment & Plan Note (Signed)
Mild elevated, likely reactive, pt reassured, and f/u bp at home and next visit

## 2021-01-07 NOTE — Assessment & Plan Note (Signed)
Lab Results  Component Value Date   HGBA1C 5.2 11/07/2020   Stable, pt to continue current medical treatment  - diet

## 2021-01-07 NOTE — Assessment & Plan Note (Signed)
C/w msk strain prn,  to f/u any worsening symptoms or concerns, check ua, for tizanidine prn

## 2021-01-16 ENCOUNTER — Inpatient Hospital Stay: Payer: 59 | Attending: Oncology | Admitting: Oncology

## 2021-01-16 ENCOUNTER — Inpatient Hospital Stay: Payer: 59

## 2021-01-16 ENCOUNTER — Other Ambulatory Visit: Payer: Self-pay

## 2021-01-16 VITALS — BP 155/85 | HR 82 | Temp 97.3°F | Resp 18 | Wt 177.5 lb

## 2021-01-16 DIAGNOSIS — D473 Essential (hemorrhagic) thrombocythemia: Secondary | ICD-10-CM | POA: Diagnosis not present

## 2021-01-16 DIAGNOSIS — Z9119 Patient's noncompliance with other medical treatment and regimen: Secondary | ICD-10-CM | POA: Insufficient documentation

## 2021-01-16 DIAGNOSIS — Z79899 Other long term (current) drug therapy: Secondary | ICD-10-CM | POA: Diagnosis not present

## 2021-01-16 DIAGNOSIS — D751 Secondary polycythemia: Secondary | ICD-10-CM

## 2021-01-16 LAB — CBC WITH DIFFERENTIAL (CANCER CENTER ONLY)
Abs Immature Granulocytes: 0.16 K/uL — ABNORMAL HIGH (ref 0.00–0.07)
Basophils Absolute: 0.2 K/uL — ABNORMAL HIGH (ref 0.0–0.1)
Basophils Relative: 1 %
Eosinophils Absolute: 0.5 K/uL (ref 0.0–0.5)
Eosinophils Relative: 2 %
HCT: 60.1 % — ABNORMAL HIGH (ref 39.0–52.0)
Hemoglobin: 17.9 g/dL — ABNORMAL HIGH (ref 13.0–17.0)
Immature Granulocytes: 1 %
Lymphocytes Relative: 11 %
Lymphs Abs: 2.4 K/uL (ref 0.7–4.0)
MCH: 26.2 pg (ref 26.0–34.0)
MCHC: 29.8 g/dL — ABNORMAL LOW (ref 30.0–36.0)
MCV: 88 fL (ref 80.0–100.0)
Monocytes Absolute: 1 K/uL (ref 0.1–1.0)
Monocytes Relative: 5 %
Neutro Abs: 17.6 K/uL — ABNORMAL HIGH (ref 1.7–7.7)
Neutrophils Relative %: 80 %
Platelet Count: 834 K/uL — ABNORMAL HIGH (ref 150–400)
RBC: 6.83 MIL/uL — ABNORMAL HIGH (ref 4.22–5.81)
RDW: 20.1 % — ABNORMAL HIGH (ref 11.5–15.5)
WBC Count: 21.8 K/uL — ABNORMAL HIGH (ref 4.0–10.5)
nRBC: 0 % (ref 0.0–0.2)

## 2021-01-16 LAB — CMP (CANCER CENTER ONLY)
ALT: 21 U/L (ref 0–44)
AST: 25 U/L (ref 15–41)
Albumin: 4.4 g/dL (ref 3.5–5.0)
Alkaline Phosphatase: 77 U/L (ref 38–126)
Anion gap: 8 (ref 5–15)
BUN: 15 mg/dL (ref 8–23)
CO2: 24 mmol/L (ref 22–32)
Calcium: 9.7 mg/dL (ref 8.9–10.3)
Chloride: 107 mmol/L (ref 98–111)
Creatinine: 1.22 mg/dL (ref 0.61–1.24)
GFR, Estimated: >60 mL/min (ref >60)
Glucose, Bld: 85 mg/dL (ref 70–99)
Potassium: 4.3 mmol/L (ref 3.5–5.1)
Sodium: 139 mmol/L (ref 135–145)
Total Bilirubin: <0.1 mg/dL — ABNORMAL LOW (ref 0.3–1.2)
Total Protein: 7.5 g/dL (ref 6.5–8.1)

## 2021-01-16 NOTE — Progress Notes (Signed)
Hematology and Oncology Follow Up Visit  [name] [id number] [birth year] 75 y.o. 01/16/2021 2:50 PM  CC: Dillon Borg, MD    Principle Diagnosis: 75 year old man with essential thrombocythemia diagnosed in 2010.  Dillon [name] was found to have JAK2 positive mutation..   Current therapy:   Hydroxyurea 500 mg daily started in March 2021.  Interim History: [name] is here for a follow-up visit.  Since the last visit, Dillon [name] has not been following regularly to titrate his hydroxyurea but had a CBC done in February 2022.  At that time his white cell count was 19, Dillon of 17.9 and a platelet count of 718.  Clinically, Dillon [name] reports feeling well without any major complaints.  Dillon [name] denies any recent hospitalization or illnesses.  Dillon [name] denies any bleeding or thrombosis episodes.  His performance status and quality of life remains unchanged.   Medications: Unchanged on review.  Current Outpatient Medications  Medication Sig Dispense Refill  . carvedilol (COREG) 12.5 MG tablet TAKE 1 TABLET (12.5 MG TOTAL) BY MOUTH 2 (TWO) TIMES DAILY WITH A MEAL. 60 tablet 5  . fluticasone (FLONASE) 50 MCG/ACT nasal spray Place 2 sprays into both nostrils daily. 16 g 6  . furosemide (LASIX) 20 MG tablet TAKE 1 TABLET BY MOUTH DAILY AS NEEDED FOR EDEMA.PLEASE CALL TO SCHEDULE OVERDUE APPT WITH DR KELLEY 30 tablet 0  . hydroxyurea (HYDREA) 500 MG capsule TAKE 1 CAPSULE (500 MG TOTAL) BY MOUTH DAILY. MAY TAKE WITH FOOD TO MINIMIZE GI SIDE EFFECTS. 90 capsule 7  . lisinopril (ZESTRIL) 2.5 MG tablet TAKE 1 TABLET BY MOUTH EVERY DAY 90 tablet 3  . lovastatin (MEVACOR) 40 MG tablet TAKE 1 TABLET BY MOUTH EVERY DAY 90 tablet 3  . tadalafil (CIALIS) 20 MG tablet Take 0.5-1 tablets (10-20 mg total) by mouth every other day as needed for erectile dysfunction. 10 tablet 11  . tamsulosin (FLOMAX) 0.4 MG CAPS capsule TAKE 1 CAPSULE BY MOUTH EVERY DAY 90 capsule 1  . tiZANidine (ZANAFLEX) 2 MG tablet Take 1 tablet (2 mg total) by  mouth every 6 (six) hours as needed for muscle spasms. 30 tablet 0  . Vitamin D, Ergocalciferol, (DRISDOL) 1.25 MG (50000 UT) CAPS capsule Take 1 capsule (50,000 Units total) by mouth every 7 (seven) days. 12 capsule 0   No current facility-administered medications for this visit.    Allergies: No Known Allergies     Physical Exam:  Blood pressure (!) 155/85, pulse 82, temperature (!) 97.3 F (36.3 C), temperature source Tympanic, resp. rate 18, weight 177 lb 8 oz (80.5 kg), SpO2 100 %.   ECOG: 1    General appearance: Alert, awake without any distress. Head: Atraumatic without abnormalities Oropharynx: Without any thrush or ulcers. Eyes: No scleral icterus. Lymph nodes: No lymphadenopathy noted in the cervical, supraclavicular, or axillary nodes Heart:regular rate and rhythm, without any murmurs or gallops.   Lung: Clear to auscultation without any rhonchi, wheezes or dullness to percussion. Abdomin: Soft, nontender without any shifting dullness or ascites. Musculoskeletal: No clubbing or cyanosis. Neurological: No motor or sensory deficits. Skin: No rashes or lesions.   Lab Results: Lab Results  Component Value Date   WBC 19.0 Repeated and verified X2. (HH) 11/07/2020   HGB 17.9 Repeated and verified X2. (H) 11/07/2020   HCT 59.1 Repeated and verified X2. (H) 11/07/2020   MCV 83.8 11/07/2020   PLT 718.0 (H) 11/07/2020     Chemistry      Component Value Date/Time  NA 140 11/07/2020 1722   NA 141 11/03/2015 1543   K 5.2 No hemolysis seen (H) 11/07/2020 1722   K 4.3 11/03/2015 1543   CL 101 11/07/2020 1722   CO2 28 11/07/2020 1722   CO2 24 11/03/2015 1543   BUN 23 11/07/2020 1722   BUN 16.5 11/03/2015 1543   CREATININE 1.42 11/07/2020 1722   CREATININE 1.2 11/03/2015 1543      Component Value Date/Time   CALCIUM 10.6 (H) 11/07/2020 1722   CALCIUM 9.4 11/03/2015 1543   ALKPHOS 79 11/07/2020 1722   ALKPHOS 74 11/03/2015 1543   AST 25 11/07/2020 1722   AST  21 11/03/2015 1543   ALT 17 11/07/2020 1722   ALT 29 11/03/2015 1543   BILITOT 1.2 11/07/2020 1722   BILITOT 0.78 11/03/2015 1543      Assessment and plan:    75 year old man with  1.  JAK2 positive essential thrombocythemia diagnosed in 2010.     Dillon [name] has been on hydroxyurea intermittently since 2010 although has been noncompliant and has follow-up.  Risks and benefits of continuing this medication at this time were discussed.  Complication associated with this therapy that include nausea, fatigue, myelosuppression among others were discussed.  Laboratory data reviewed today and continues to show elevation in his white cell count, Dillon and platelets.  Based on these labs I recommended increasing the dose of hydroxyurea to 1000 mg at nighttime and will reevaluate laboratory testing in the next few months.  2.  Thrombosis prophylaxis: No evidence of thrombosis noted at this time.  I recommended continuing aspirin.  3.  Follow-up: Dillon [name] will return in 3 months for repeat evaluation.  30  minutes were spent on this encounter.  The time was dedicated to reviewing disease status, discussing treatment options and outlining future plan of care.    Zola Button 5/3/20222:50 PM

## 2021-04-13 ENCOUNTER — Telehealth: Payer: Self-pay | Admitting: Oncology

## 2021-04-13 NOTE — Telephone Encounter (Signed)
Called patient regarding 08/02 appointment rescheduled to 08/31 per provider pal, patient is notified.

## 2021-04-17 ENCOUNTER — Ambulatory Visit: Payer: 59 | Admitting: Oncology

## 2021-04-17 ENCOUNTER — Other Ambulatory Visit: Payer: 59

## 2021-04-28 ENCOUNTER — Other Ambulatory Visit: Payer: Self-pay | Admitting: Internal Medicine

## 2021-04-28 NOTE — Telephone Encounter (Signed)
Please refill as per office routine med refill policy (all routine meds refilled for 3 mo or monthly per pt preference up to one year from last visit, then month to month grace period for 3 mo, then further med refills will have to be denied)  

## 2021-05-16 ENCOUNTER — Telehealth: Payer: Self-pay

## 2021-05-16 ENCOUNTER — Inpatient Hospital Stay (HOSPITAL_BASED_OUTPATIENT_CLINIC_OR_DEPARTMENT_OTHER): Payer: 59 | Admitting: Oncology

## 2021-05-16 ENCOUNTER — Other Ambulatory Visit: Payer: Self-pay

## 2021-05-16 ENCOUNTER — Inpatient Hospital Stay: Payer: 59 | Attending: Oncology

## 2021-05-16 VITALS — BP 145/73 | HR 89 | Temp 98.0°F | Resp 17 | Ht 69.0 in | Wt 178.9 lb

## 2021-05-16 DIAGNOSIS — D471 Chronic myeloproliferative disease: Secondary | ICD-10-CM | POA: Insufficient documentation

## 2021-05-16 DIAGNOSIS — D751 Secondary polycythemia: Secondary | ICD-10-CM | POA: Diagnosis not present

## 2021-05-16 DIAGNOSIS — Z79899 Other long term (current) drug therapy: Secondary | ICD-10-CM | POA: Insufficient documentation

## 2021-05-16 DIAGNOSIS — D75839 Thrombocytosis, unspecified: Secondary | ICD-10-CM | POA: Diagnosis not present

## 2021-05-16 LAB — CBC WITH DIFFERENTIAL (CANCER CENTER ONLY)
Abs Immature Granulocytes: 0.11 10*3/uL — ABNORMAL HIGH (ref 0.00–0.07)
Basophils Absolute: 0.1 10*3/uL (ref 0.0–0.1)
Basophils Relative: 1 %
Eosinophils Absolute: 0.5 10*3/uL (ref 0.0–0.5)
Eosinophils Relative: 3 %
HCT: 59.2 % — ABNORMAL HIGH (ref 39.0–52.0)
Hemoglobin: 18.2 g/dL — ABNORMAL HIGH (ref 13.0–17.0)
Immature Granulocytes: 1 %
Lymphocytes Relative: 10 %
Lymphs Abs: 1.7 10*3/uL (ref 0.7–4.0)
MCH: 28.3 pg (ref 26.0–34.0)
MCHC: 30.7 g/dL (ref 30.0–36.0)
MCV: 91.9 fL (ref 80.0–100.0)
Monocytes Absolute: 0.9 10*3/uL (ref 0.1–1.0)
Monocytes Relative: 5 %
Neutro Abs: 13.6 10*3/uL — ABNORMAL HIGH (ref 1.7–7.7)
Neutrophils Relative %: 80 %
Platelet Count: 1101 10*3/uL (ref 150–400)
RBC: 6.44 MIL/uL — ABNORMAL HIGH (ref 4.22–5.81)
RDW: 17.3 % — ABNORMAL HIGH (ref 11.5–15.5)
WBC Count: 16.9 10*3/uL — ABNORMAL HIGH (ref 4.0–10.5)
nRBC: 0 % (ref 0.0–0.2)

## 2021-05-16 LAB — CMP (CANCER CENTER ONLY)
ALT: 21 U/L (ref 0–44)
AST: 23 U/L (ref 15–41)
Albumin: 4 g/dL (ref 3.5–5.0)
Alkaline Phosphatase: 84 U/L (ref 38–126)
Anion gap: 10 (ref 5–15)
BUN: 16 mg/dL (ref 8–23)
CO2: 22 mmol/L (ref 22–32)
Calcium: 9.4 mg/dL (ref 8.9–10.3)
Chloride: 111 mmol/L (ref 98–111)
Creatinine: 1.31 mg/dL — ABNORMAL HIGH (ref 0.61–1.24)
GFR, Estimated: 56 mL/min — ABNORMAL LOW (ref >60)
Glucose, Bld: 127 mg/dL — ABNORMAL HIGH (ref 70–99)
Potassium: 4.2 mmol/L (ref 3.5–5.1)
Sodium: 143 mmol/L (ref 135–145)
Total Bilirubin: 0.6 mg/dL (ref 0.3–1.2)
Total Protein: 7 g/dL (ref 6.5–8.1)

## 2021-05-16 MED ORDER — HYDROXYUREA 500 MG PO CAPS: 1000.0000 mg | ORAL_CAPSULE | Freq: Every day | ORAL | 3 refills | Status: DC

## 2021-05-16 NOTE — Telephone Encounter (Signed)
CRITICAL VALUE STICKER  CRITICAL VALUE: Platelet count of 1,101,000   RECEIVER (on-site recipient of call): Evette Georges, [place] NOTIFIED: 1:13 pm, 05/16/21  MESSENGER (representative from lab): Pam  MD NOTIFIED: Dr. Alen Blew  TIME OF NOTIFICATION: 1:13 pm  RESPONSE:  Noted

## 2021-05-16 NOTE — Progress Notes (Signed)
Hematology and Oncology Follow Up Visit  Dillon [name] [id number] Dec 11, 1945 75 y.o. 05/16/2021 12:38 PM  CC: Biagio Borg, MD    Principle Diagnosis: 75 year old man with JAK2 positive myeloproliferative disorder diagnosed in 2010.  He was found to have essential thrombocythemia at that time.   Current therapy:   Hydroxyurea 500 mg daily started in March 2021.  His dose was increased to 1000 mg started in May 2022.  Interim History: Mr. Seats returns today for repeat evaluation.  Since the last visit, he reports no major changes in his health.  He ran out of hydroxyurea and has not taken it in the last month.  He denies any bleeding complications.  He denies any headaches, blurry vision or syncope.  Continues to work full-time without any decline in ability to do so.  Medications: Updated on review.  Current Outpatient Medications  Medication Sig Dispense Refill   carvedilol (COREG) 12.5 MG tablet TAKE 1 TABLET (12.5 MG TOTAL) BY MOUTH 2 (TWO) TIMES DAILY WITH A MEAL. 60 tablet 5   fluticasone (FLONASE) 50 MCG/ACT nasal spray Place 2 sprays into both nostrils daily. 16 g 6   furosemide (LASIX) 20 MG tablet TAKE 1 TABLET BY MOUTH DAILY AS NEEDED FOR EDEMA.PLEASE CALL TO SCHEDULE OVERDUE APPT WITH DR KELLEY 30 tablet 0   hydroxyurea (HYDREA) 500 MG capsule TAKE 1 CAPSULE (500 MG TOTAL) BY MOUTH DAILY. MAY TAKE WITH FOOD TO MINIMIZE GI SIDE EFFECTS. 90 capsule 7   lisinopril (ZESTRIL) 2.5 MG tablet TAKE 1 TABLET BY MOUTH EVERY DAY 90 tablet 3   lovastatin (MEVACOR) 40 MG tablet TAKE 1 TABLET BY MOUTH EVERY DAY 90 tablet 3   tadalafil (CIALIS) 20 MG tablet Take 0.5-1 tablets (10-20 mg total) by mouth every other day as needed for erectile dysfunction. 10 tablet 11   tamsulosin (FLOMAX) 0.4 MG CAPS capsule TAKE 1 CAPSULE BY MOUTH EVERY DAY 90 capsule 1   tiZANidine (ZANAFLEX) 2 MG tablet Take 1 tablet (2 mg total) by mouth every 6 (six) hours as needed for muscle spasms. 30 tablet 0    Vitamin D, Ergocalciferol, (DRISDOL) 1.25 MG (50000 UT) CAPS capsule Take 1 capsule (50,000 Units total) by mouth every 7 (seven) days. 12 capsule 0   No current facility-administered medications for this visit.    Allergies: No Known Allergies     Physical Exam:  Blood pressure (!) 145/73, pulse 89, temperature 98 F (36.7 C), temperature source Oral, resp. rate 17, height '5\' 9"'$  (1.753 m), weight 178 lb 14.4 oz (81.1 kg), SpO2 98 %.    ECOG: 1   General appearance: Comfortable appearing without any discomfort Head: Normocephalic without any trauma Oropharynx: Mucous membranes are moist and pink without any thrush or ulcers. Eyes: Pupils are equal and round reactive to light. Lymph nodes: No cervical, supraclavicular, inguinal or axillary lymphadenopathy.   Heart:regular rate and rhythm.  S1 and S2 without leg edema. Lung: Clear without any rhonchi or wheezes.  No dullness to percussion. Abdomin: Soft, nontender, nondistended with good bowel sounds.  No hepatosplenomegaly. Musculoskeletal: No joint deformity or effusion.  Full range of motion noted. Neurological: No deficits noted on motor, sensory and deep tendon reflex exam. Skin: No petechial rash or dryness.  Appeared moist.     Lab Results: Lab Results  Component Value Date   WBC 21.8 (H) 01/16/2021   HGB 17.9 (H) 01/16/2021   HCT 60.1 (H) 01/16/2021   MCV 88.0 01/16/2021   PLT 834 (H)  01/16/2021     Chemistry      Component Value Date/Time   NA 139 01/16/2021 1441   NA 141 11/03/2015 1543   K 4.3 01/16/2021 1441   K 4.3 11/03/2015 1543   CL 107 01/16/2021 1441   CO2 24 01/16/2021 1441   CO2 24 11/03/2015 1543   BUN 15 01/16/2021 1441   BUN 16.5 11/03/2015 1543   CREATININE 1.22 01/16/2021 1441   CREATININE 1.2 11/03/2015 1543      Component Value Date/Time   CALCIUM 9.7 01/16/2021 1441   CALCIUM 9.4 11/03/2015 1543   ALKPHOS 77 01/16/2021 1441   ALKPHOS 74 11/03/2015 1543   AST 25 01/16/2021  1441   AST 21 11/03/2015 1543   ALT 21 01/16/2021 1441   ALT 29 11/03/2015 1543   BILITOT <0.1 (L) 01/16/2021 1441   BILITOT 0.78 11/03/2015 1543      Assessment and plan:    76 year old man with  1.  Essential thrombocythemia diagnosed in 2010.  He was found to have JAK2 positive myeloproliferative disorder.  His disease status was updated at this time and treatment choices were reviewed.  Risks and benefits of continuing hydroxyurea dosing were discussed.  Complications that include cytopenias, GI toxicity oral ulcers among others were reiterated.  Laboratory data from today reviewed and showed increase in his white cell count and platelet count which is predictable given he has not taken it over the last month.  I plan to resume at at that total dose of 1000 mg daily and will reevaluate in 4 months.  2.  Thrombosis prophylaxis: Risk of thrombosis remains high given his age and white cell count.  He does not have any previous thrombosis episode which reduces his risk slightly.  I recommended continuing aspirin for the time being.  3.  Follow-up: In 4 months for repeat follow-up.  30  minutes were dedicated to this visit.  The time was spent on reviewing laboratory data, disease status update, treatment choices and complications related to his condition of therapy.    Zola Button 8/31/202212:38 PM

## 2021-07-23 ENCOUNTER — Other Ambulatory Visit: Payer: Self-pay

## 2021-07-23 ENCOUNTER — Telehealth: Payer: Self-pay | Admitting: Internal Medicine

## 2021-07-23 MED ORDER — TADALAFIL 20 MG PO TABS: 10.0000 mg | ORAL_TABLET | ORAL | 11 refills | Status: DC | PRN

## 2021-07-23 NOTE — Telephone Encounter (Signed)
1.Medication Requested: tadalafil (CIALIS) 20 MG tablet  2. Pharmacy (Name, [place], [place]): [id number] - [place], [place] Phone:  [phone]  Fax:  [phone]     3. On Med List: Y  4. Last Visit with PCP: 4.19.2022  5. Next visit date with PCP: 11.10.2022   Agent: Please be advised that RX refills may take up to 3 business days. We ask that you follow-up with your pharmacy.

## 2021-07-26 ENCOUNTER — Ambulatory Visit (INDEPENDENT_AMBULATORY_CARE_PROVIDER_SITE_OTHER): Payer: 59 | Admitting: Internal Medicine

## 2021-07-26 ENCOUNTER — Other Ambulatory Visit: Payer: Self-pay

## 2021-07-26 ENCOUNTER — Encounter: Payer: Self-pay | Admitting: Internal Medicine

## 2021-07-26 VITALS — BP 124/70 | HR 89 | Temp 98.8°F | Ht 69.0 in | Wt 178.0 lb

## 2021-07-26 DIAGNOSIS — D471 Chronic myeloproliferative disease: Secondary | ICD-10-CM

## 2021-07-26 DIAGNOSIS — E559 Vitamin D deficiency, unspecified: Secondary | ICD-10-CM | POA: Diagnosis not present

## 2021-07-26 DIAGNOSIS — R7302 Impaired glucose tolerance (oral): Secondary | ICD-10-CM

## 2021-07-26 DIAGNOSIS — N1831 Chronic kidney disease, stage 3a: Secondary | ICD-10-CM

## 2021-07-26 DIAGNOSIS — N529 Male erectile dysfunction, unspecified: Secondary | ICD-10-CM | POA: Diagnosis not present

## 2021-07-26 DIAGNOSIS — I10 Essential (primary) hypertension: Secondary | ICD-10-CM

## 2021-07-26 MED ORDER — TADALAFIL 20 MG PO TABS: 10.0000 mg | ORAL_TABLET | ORAL | 11 refills | Status: DC | PRN

## 2021-07-26 NOTE — Patient Instructions (Signed)
Please continue all other medications as before, and refills have been done if requested - cialis  Please have the pharmacy call with any other refills you may need.  Please continue your efforts at being more active, low cholesterol diet, and weight control.  You are otherwise up to date with prevention measures today.  Please keep your appointments with your specialists as you may have planned  Please go to the LAB at the blood drawing area for the tests to be done  You will be contacted by phone if any changes need to be made immediately.  Otherwise, you will receive a letter about your results with an explanation, but please check with MyChart first.  Please remember to sign up for MyChart if you have not done so, as this will be important to you in the future with finding out test results, communicating by private email, and scheduling acute appointments online when needed.  Please make an Appointment to return in 6 months, or sooner if needed

## 2021-07-26 NOTE — Progress Notes (Signed)
Patient ID: Dillon [name], male   DOB: [birth year], 75 y.o.   MRN: [id number]        Chief Complaint: follow up blood counts, and ED       HPI:  Dillon [name] is a 75 y.o. male here overall doing ok, Pt denies chest pain, increased sob or doe, wheezing, orthopnea, PND, increased LE swelling, palpitations, dizziness or syncope.   Pt denies polydipsia, polyuria, or new focal neuro s/s.   Does have worsening Ed symptoms over the past yr, and mild peyronies that he is concerned about, but not getting worse and not intercourse inhibiting.   Pt denies fever, wt loss, night sweats, loss of appetite, or other constitutional symptoms       Conts to f/u with heme/onc and tolerating hydroxyurea, though out for 1 mo recent and restarted aug 2022.  Pt is taking asa.   Wt Readings from Last 3 Encounters:  07/26/21 178 lb (80.7 kg)  05/16/21 178 lb 14.4 oz (81.1 kg)  01/16/21 177 lb 8 oz (80.5 kg)   BP Readings from Last 3 Encounters:  07/26/21 124/70  05/16/21 (!) 145/73  01/16/21 (!) 155/85         Past Medical History:  Diagnosis Date   Arthritis    right shoulder   Bladder neck obstruction 04/18/2011   CKD (chronic kidney disease), stage III ([place]) 09/12/2015   ERECTILE DYSFUNCTION 11/15/2008   Essential hypertension, benign 04/28/2013   FATIGUE 11/15/2008   GERD 11/15/2008   History of adenomatous polyp of colon 05/06/2014   2010   HYPERLIPIDEMIA 11/15/2008   Impaired glucose tolerance 04/18/2011   LEG PAIN, LEFT 11/05/2010   LVH (left ventricular hypertrophy) 04/27/2012   MIGRAINE, COMMON 08/06/2010   Nocturia 12/19/2008   Polycythemia 04/27/2012   Swelling of limb 11/05/2010   THROMBOCYTOSIS 12/19/2008   myleoproliferative disorder   Past Surgical History:  Procedure Laterality Date   APPENDECTOMY     CARDIAC CATHETERIZATION N/A 09/05/2015   Procedure: Left Heart Cath and Coronary Angiography;  Surgeon: Troy Sine, MD;  Location: [facility];  Service: Cardiovascular;  Laterality:  N/A;   CIRCUMCISION     30+yrs ago   COLONOSCOPY     POLYPECTOMY     s/p esophageal dilation     TONSILLECTOMY     UPPER GASTROINTESTINAL ENDOSCOPY      reports that he quit smoking about 50 years ago. His smoking use included cigarettes. He has a 6.00 pack-year smoking history. He has never used smokeless tobacco. He reports current alcohol use. He reports that he does not use drugs. family history includes Cancer in his father; Hypertension in his brother, mother, and sister; Prostate cancer in his father. No Known Allergies Current Outpatient Medications on File Prior to Visit  Medication Sig Dispense Refill   carvedilol (COREG) 12.5 MG tablet TAKE 1 TABLET (12.5 MG TOTAL) BY MOUTH 2 (TWO) TIMES DAILY WITH A MEAL. 60 tablet 5   fluticasone (FLONASE) 50 MCG/ACT nasal spray Place 2 sprays into both nostrils daily. 16 g 6   furosemide (LASIX) 20 MG tablet TAKE 1 TABLET BY MOUTH DAILY AS NEEDED FOR EDEMA.PLEASE CALL TO SCHEDULE OVERDUE APPT WITH DR KELLEY 30 tablet 0   hydroxyurea (HYDREA) 500 MG capsule Take 2 capsules (1,000 mg total) by mouth daily. May take with food to minimize GI side effects. 120 capsule 3   lisinopril (ZESTRIL) 2.5 MG tablet TAKE 1 TABLET BY MOUTH EVERY DAY 90 tablet 3  lovastatin (MEVACOR) 40 MG tablet TAKE 1 TABLET BY MOUTH EVERY DAY 90 tablet 3   tamsulosin (FLOMAX) 0.4 MG CAPS capsule TAKE 1 CAPSULE BY MOUTH EVERY DAY 90 capsule 1   tiZANidine (ZANAFLEX) 2 MG tablet Take 1 tablet (2 mg total) by mouth every 6 (six) hours as needed for muscle spasms. 30 tablet 0   Vitamin D, Ergocalciferol, (DRISDOL) 1.25 MG (50000 UT) CAPS capsule Take 1 capsule (50,000 Units total) by mouth every 7 (seven) days. 12 capsule 0   No current facility-administered medications on file prior to visit.        ROS:  All others reviewed and negative.  Objective        PE:  BP 124/70 (BP Location: Right Arm, Patient Position: Sitting, Cuff Size: Large)   Pulse 89   Temp 98.8 F  (37.1 C) (Oral)   Ht 5\' 9"  (1.753 m)   Wt 178 lb (80.7 kg)   SpO2 97%   BMI 26.29 kg/m                 Constitutional: Pt appears in NAD               HENT: Head: NCAT.                Right Ear: External ear normal.                 Left Ear: External ear normal.                Eyes: . Pupils are equal, round, and reactive to light. Conjunctivae and EOM are normal               Nose: without d/c or deformity               Neck: Neck supple. Gross normal ROM               Cardiovascular: Normal rate and regular rhythm.                 Pulmonary/Chest: Effort normal and breath sounds without rales or wheezing.                Abd:  Soft, NT, ND, + BS, no organomegaly               Neurological: Pt is alert. At baseline orientation, motor grossly intact               Skin: Skin is warm. No rashes, no other new lesions, LE edema - none               Psychiatric: Pt behavior is normal without agitation   Micro: none  Cardiac tracings I have personally interpreted today:  none  Pertinent Radiological findings (summarize): none   Lab Results  Component Value Date   WBC 16.9 (H) 05/16/2021   HGB 18.2 (H) 05/16/2021   HCT 59.2 (H) 05/16/2021   PLT 1,101 (HH) 05/16/2021   GLUCOSE 127 (H) 05/16/2021   CHOL 146 11/07/2020   TRIG 143.0 11/07/2020   HDL 59.40 11/07/2020   LDLDIRECT 57.0 12/15/2017   LDLCALC 58 11/07/2020   ALT 21 05/16/2021   AST 23 05/16/2021   NA 143 05/16/2021   K 4.2 05/16/2021   CL 111 05/16/2021   CREATININE 1.31 (H) 05/16/2021   BUN 16 05/16/2021   CO2 22 05/16/2021   TSH 4.38 11/07/2020   PSA 1.80 11/07/2020   INR 1.32  09/04/2015   HGBA1C 5.2 11/07/2020   Assessment/Plan:  Dillon [name] is a 75 y.o. Black or African American [2] male with  has a past medical history of Arthritis, Bladder neck obstruction (04/18/2011), CKD (chronic kidney disease), stage III ([place]) (09/12/2015), ERECTILE DYSFUNCTION (11/15/2008), Essential hypertension, benign  (04/28/2013), FATIGUE (11/15/2008), GERD (11/15/2008), History of adenomatous polyp of colon (05/06/2014), HYPERLIPIDEMIA (11/15/2008), Impaired glucose tolerance (04/18/2011), LEG PAIN, LEFT (11/05/2010), LVH (left ventricular hypertrophy) (04/27/2012), MIGRAINE, COMMON (08/06/2010), Nocturia (12/19/2008), Polycythemia (04/27/2012), Swelling of limb (11/05/2010), and THROMBOCYTOSIS (12/19/2008).  Myeloproliferative disorder ([place]) Pt requests f/u cbc, will cont hydroxyuriea and f/u heme as well  Erectile dysfunction Ok for ciallis prn,  to f/u any worsening symptoms or concerns  Vitamin D deficiency Last vitamin D Lab Results  Component Value Date   VD25OH 34.47 11/07/2020   Low normal, to start oral replacement   Hypercalcemia Very mild, also for PTH with labs  CKD (chronic kidney disease), stage III Lab Results  Component Value Date   CREATININE 1.31 (H) 05/16/2021   Stable overall, cont to avoid nephrotoxins  Essential hypertension, benign BP Readings from Last 3 Encounters:  07/26/21 124/70  05/16/21 (!) 145/73  01/16/21 (!) 155/85   Stable, pt to continue medical treatment coreg, lisinopril   Impaired glucose tolerance Lab Results  Component Value Date   HGBA1C 5.2 11/07/2020   Stable, pt to continue current medical treatment  - diet  Followup: Return in about 6 months (around 01/23/2022).  Cathlean Cower, MD 07/27/2021 4:50 AM [facility]

## 2021-07-27 ENCOUNTER — Encounter: Payer: Self-pay | Admitting: Internal Medicine

## 2021-07-27 DIAGNOSIS — D471 Chronic myeloproliferative disease: Secondary | ICD-10-CM | POA: Insufficient documentation

## 2021-07-27 DIAGNOSIS — E559 Vitamin D deficiency, unspecified: Secondary | ICD-10-CM | POA: Insufficient documentation

## 2021-07-27 DIAGNOSIS — N529 Male erectile dysfunction, unspecified: Secondary | ICD-10-CM | POA: Insufficient documentation

## 2021-07-27 NOTE — Assessment & Plan Note (Signed)
BP Readings from Last 3 Encounters:  07/26/21 124/70  05/16/21 (!) 145/73  01/16/21 (!) 155/85   Stable, pt to continue medical treatment coreg, lisinopril

## 2021-07-27 NOTE — Assessment & Plan Note (Signed)
Pt requests f/u cbc, will cont hydroxyuriea and f/u heme as well

## 2021-07-27 NOTE — Assessment & Plan Note (Signed)
Lab Results  Component Value Date   CREATININE 1.31 (H) 05/16/2021   Stable overall, cont to avoid nephrotoxins

## 2021-07-27 NOTE — Assessment & Plan Note (Signed)
Very mild, also for PTH with labs

## 2021-07-27 NOTE — Assessment & Plan Note (Signed)
[place] for ciallis prn,  to f/u any worsening symptoms or concerns

## 2021-07-27 NOTE — Assessment & Plan Note (Signed)
Last vitamin D Lab Results  Component Value Date   VD25OH 34.47 11/07/2020   Low normal, to start oral replacement

## 2021-07-27 NOTE — Assessment & Plan Note (Signed)
Lab Results  Component Value Date   HGBA1C 5.2 11/07/2020   Stable, pt to continue current medical treatment  - diet

## 2021-09-05 ENCOUNTER — Encounter: Payer: Self-pay | Admitting: Oncology

## 2021-09-12 ENCOUNTER — Inpatient Hospital Stay: Payer: 59 | Admitting: Oncology

## 2021-09-12 ENCOUNTER — Inpatient Hospital Stay: Payer: 59 | Attending: Internal Medicine

## 2021-09-12 ENCOUNTER — Telehealth: Payer: Self-pay | Admitting: *Deleted

## 2021-09-12 NOTE — Telephone Encounter (Signed)
PC to patient regarding missed lab & MD appointments today, he was unaware.  Informed patient scheduling will contact him to reschedule these appointments, he verbalizes understanding.  Scheduling message sent.

## 2021-09-18 ENCOUNTER — Telehealth: Payer: Self-pay | Admitting: Oncology

## 2021-09-18 NOTE — Telephone Encounter (Signed)
Scheduled per 12/28 sch msg, pt has been called and confirmed appt

## 2021-09-26 ENCOUNTER — Inpatient Hospital Stay: Payer: 59 | Attending: Internal Medicine

## 2021-09-26 ENCOUNTER — Other Ambulatory Visit: Payer: Self-pay

## 2021-09-26 ENCOUNTER — Inpatient Hospital Stay (HOSPITAL_BASED_OUTPATIENT_CLINIC_OR_DEPARTMENT_OTHER): Payer: 59 | Admitting: Oncology

## 2021-09-26 VITALS — BP 143/99 | HR 87 | Temp 97.9°F | Resp 16 | Ht 69.0 in | Wt 177.0 lb

## 2021-09-26 DIAGNOSIS — D473 Essential (hemorrhagic) thrombocythemia: Secondary | ICD-10-CM | POA: Insufficient documentation

## 2021-09-26 DIAGNOSIS — Z79899 Other long term (current) drug therapy: Secondary | ICD-10-CM | POA: Diagnosis not present

## 2021-09-26 DIAGNOSIS — D751 Secondary polycythemia: Secondary | ICD-10-CM

## 2021-09-26 DIAGNOSIS — D471 Chronic myeloproliferative disease: Secondary | ICD-10-CM | POA: Diagnosis not present

## 2021-09-26 DIAGNOSIS — D75839 Thrombocytosis, unspecified: Secondary | ICD-10-CM | POA: Diagnosis not present

## 2021-09-26 LAB — CBC WITH DIFFERENTIAL (CANCER CENTER ONLY)
Abs Immature Granulocytes: 0.06 10*3/uL (ref 0.00–0.07)
Basophils Absolute: 0.1 10*3/uL (ref 0.0–0.1)
Basophils Relative: 1 %
Eosinophils Absolute: 0.3 10*3/uL (ref 0.0–0.5)
Eosinophils Relative: 2 %
HCT: 59.3 % — ABNORMAL HIGH (ref 39.0–52.0)
Hemoglobin: 18.9 g/dL — ABNORMAL HIGH (ref 13.0–17.0)
Immature Granulocytes: 1 %
Lymphocytes Relative: 11 %
Lymphs Abs: 1.4 10*3/uL (ref 0.7–4.0)
MCH: 30.8 pg (ref 26.0–34.0)
MCHC: 31.9 g/dL (ref 30.0–36.0)
MCV: 96.7 fL (ref 80.0–100.0)
Monocytes Absolute: 0.6 10*3/uL (ref 0.1–1.0)
Monocytes Relative: 4 %
Neutro Abs: 10.5 10*3/uL — ABNORMAL HIGH (ref 1.7–7.7)
Neutrophils Relative %: 81 %
Platelet Count: 714 10*3/uL — ABNORMAL HIGH (ref 150–400)
RBC: 6.13 MIL/uL — ABNORMAL HIGH (ref 4.22–5.81)
RDW: 18.6 % — ABNORMAL HIGH (ref 11.5–15.5)
WBC Count: 12.8 10*3/uL — ABNORMAL HIGH (ref 4.0–10.5)
nRBC: 0 % (ref 0.0–0.2)

## 2021-09-26 LAB — CMP (CANCER CENTER ONLY)
ALT: 12 U/L (ref 0–44)
AST: 16 U/L (ref 15–41)
Albumin: 4.2 g/dL (ref 3.5–5.0)
Alkaline Phosphatase: 60 U/L (ref 38–126)
Anion gap: 6 (ref 5–15)
BUN: 16 mg/dL (ref 8–23)
CO2: 29 mmol/L (ref 22–32)
Calcium: 9.4 mg/dL (ref 8.9–10.3)
Chloride: 105 mmol/L (ref 98–111)
Creatinine: 1.21 mg/dL (ref 0.61–1.24)
GFR, Estimated: >60 mL/min (ref >60)
Glucose, Bld: 83 mg/dL (ref 70–99)
Potassium: 4.3 mmol/L (ref 3.5–5.1)
Sodium: 140 mmol/L (ref 135–145)
Total Bilirubin: 1.7 mg/dL — ABNORMAL HIGH (ref 0.3–1.2)
Total Protein: 6.9 g/dL (ref 6.5–8.1)

## 2021-09-26 NOTE — Progress Notes (Signed)
Hematology and Oncology Follow Up Visit  Dillon [name] [id number] 1945/12/21 76 y.o. 09/26/2021 10:07 AM  CC: Biagio Borg, MD    Principle Diagnosis: 76 year old man with essential thrombocythemia diagnosed in 2010.  He was found to have JAK2 positive myeloproliferative disorder.   Current therapy:   Hydroxyurea 500 mg daily started in March 2021.  His dose was increased to 1000 mg started in May 2022.  Interim History: Dillon [name] returns today for a follow-up evaluation.  Since the last visit, he reports no major changes in his health.  He continues to tolerate hydroxyurea at the current dose without any issues.  He denies any nausea, vomiting or abdominal pain.  He denies any excessive fatigue or tiredness.  He does report occasional drowsiness associated with hydroxyurea but takes it at nighttime.  Medications: Reviewed without changes.   Current Outpatient Medications  Medication Sig Dispense Refill   carvedilol (COREG) 12.5 MG tablet TAKE 1 TABLET (12.5 MG TOTAL) BY MOUTH 2 (TWO) TIMES DAILY WITH A MEAL. 60 tablet 5   fluticasone (FLONASE) 50 MCG/ACT nasal spray Place 2 sprays into both nostrils daily. 16 g 6   furosemide (LASIX) 20 MG tablet TAKE 1 TABLET BY MOUTH DAILY AS NEEDED FOR EDEMA.PLEASE CALL TO SCHEDULE OVERDUE APPT WITH DR KELLEY 30 tablet 0   hydroxyurea (HYDREA) 500 MG capsule Take 2 capsules (1,000 mg total) by mouth daily. May take with food to minimize GI side effects. 120 capsule 3   lisinopril (ZESTRIL) 2.5 MG tablet TAKE 1 TABLET BY MOUTH EVERY DAY 90 tablet 3   lovastatin (MEVACOR) 40 MG tablet TAKE 1 TABLET BY MOUTH EVERY DAY 90 tablet 3   tadalafil (CIALIS) 20 MG tablet Take 0.5-1 tablets (10-20 mg total) by mouth every other day as needed for erectile dysfunction. 10 tablet 11   tamsulosin (FLOMAX) 0.4 MG CAPS capsule TAKE 1 CAPSULE BY MOUTH EVERY DAY 90 capsule 1   tiZANidine (ZANAFLEX) 2 MG tablet Take 1 tablet (2 mg total) by mouth every 6 (six)  hours as needed for muscle spasms. 30 tablet 0   Vitamin D, Ergocalciferol, (DRISDOL) 1.25 MG (50000 UT) CAPS capsule Take 1 capsule (50,000 Units total) by mouth every 7 (seven) days. 12 capsule 0   No current facility-administered medications for this visit.    Allergies: No Known Allergies     Physical Exam:   Blood pressure (!) 143/99, pulse 87, temperature 97.9 F (36.6 C), temperature source Temporal, resp. rate 16, height 5\' 9"  (1.753 m), weight 177 lb (80.3 kg), SpO2 100 %.    ECOG: 1    General appearance: Alert, awake without any distress. Head: Atraumatic without abnormalities Oropharynx: Without any thrush or ulcers. Eyes: No scleral icterus. Lymph nodes: No lymphadenopathy noted in the cervical, supraclavicular, or axillary nodes Heart:regular rate and rhythm, without any murmurs or gallops.   Lung: Clear to auscultation without any rhonchi, wheezes or dullness to percussion. Abdomin: Soft, nontender without any shifting dullness or ascites. Musculoskeletal: No clubbing or cyanosis. Neurological: No motor or sensory deficits. Skin: No rashes or lesions. Psychiatric: Mood and affect appeared normal.      Lab Results: Lab Results  Component Value Date   WBC 16.9 (H) 05/16/2021   HGB 18.2 (H) 05/16/2021   HCT 59.2 (H) 05/16/2021   MCV 91.9 05/16/2021   PLT 1,101 (HH) 05/16/2021     Chemistry      Component Value Date/Time   NA 143 05/16/2021 1253   NA  141 11/03/2015 1543   K 4.2 05/16/2021 1253   K 4.3 11/03/2015 1543   CL 111 05/16/2021 1253   CO2 22 05/16/2021 1253   CO2 24 11/03/2015 1543   BUN 16 05/16/2021 1253   BUN 16.5 11/03/2015 1543   CREATININE 1.31 (H) 05/16/2021 1253   CREATININE 1.2 11/03/2015 1543      Component Value Date/Time   CALCIUM 9.4 05/16/2021 1253   CALCIUM 9.4 11/03/2015 1543   ALKPHOS 84 05/16/2021 1253   ALKPHOS 74 11/03/2015 1543   AST 23 05/16/2021 1253   AST 21 11/03/2015 1543   ALT 21 05/16/2021 1253    ALT 29 11/03/2015 1543   BILITOT 0.6 05/16/2021 1253   BILITOT 0.78 11/03/2015 1543      Assessment and plan:    76 year old man with  1.  JAK2 positive essential thrombocythemia diagnosed in 2010.  He is currently on hydroxyurea with increased dosing which she has tolerated very well.  Risks and benefits of continuing this treatment were reviewed at this time.  Complications including oral pain, fatigue and dermatological toxicity were discussed.  Laboratory data reviewed and showed reasonable response to therapy with platelets dropping to 714.  I recommended continuing the same dose and schedule at this time.  His white cell count also responding properly.  He is agreeable to continue at this time.  2.  Thrombosis prophylaxis: I recommended continuing aspirin at this time to prevent thrombosis.  3.  Polycythemia: We will defer the option for phlebotomy at this time and continue to monitor.  I believe hydroxyurea should be adequate in controlling his hemoglobin moving forward.  4.  Follow-up: He will return in 4 months.  30  minutes were spent on this encounter.  The time was dedicated to reviewing laboratory data, disease status update and outlining future plan of care review.    Zola Button 1/11/202310:07 AM

## 2021-11-03 ENCOUNTER — Other Ambulatory Visit: Payer: Self-pay | Admitting: Internal Medicine

## 2021-11-03 NOTE — Telephone Encounter (Signed)
Please refill as per office routine med refill policy (all routine meds to be refilled for 3 mo or monthly (per pt preference) up to one year from last visit, then month to month grace period for 3 mo, then further med refills will have to be denied) ? ?

## 2021-11-14 ENCOUNTER — Other Ambulatory Visit: Payer: Self-pay | Admitting: Internal Medicine

## 2021-11-14 NOTE — Telephone Encounter (Signed)
Please refill as per office routine med refill policy (all routine meds to be refilled for 3 mo or monthly (per pt preference) up to one year from last visit, then month to month grace period for 3 mo, then further med refills will have to be denied) ? ?

## 2021-12-31 ENCOUNTER — Telehealth: Payer: Self-pay | Admitting: Oncology

## 2021-12-31 NOTE — Telephone Encounter (Signed)
Called patient regarding upcoming appointments, left a voicemail. 

## 2022-01-16 ENCOUNTER — Encounter: Payer: Self-pay | Admitting: Oncology

## 2022-01-23 ENCOUNTER — Inpatient Hospital Stay: Payer: 59 | Attending: Oncology

## 2022-01-23 ENCOUNTER — Other Ambulatory Visit: Payer: Self-pay

## 2022-01-23 ENCOUNTER — Inpatient Hospital Stay (HOSPITAL_BASED_OUTPATIENT_CLINIC_OR_DEPARTMENT_OTHER): Payer: 59 | Admitting: Oncology

## 2022-01-23 ENCOUNTER — Encounter: Payer: Self-pay | Admitting: Oncology

## 2022-01-23 VITALS — BP 140/71 | HR 71 | Temp 97.7°F | Resp 16 | Ht 69.0 in | Wt 173.4 lb

## 2022-01-23 DIAGNOSIS — D473 Essential (hemorrhagic) thrombocythemia: Secondary | ICD-10-CM | POA: Diagnosis not present

## 2022-01-23 DIAGNOSIS — D75839 Thrombocytosis, unspecified: Secondary | ICD-10-CM

## 2022-01-23 DIAGNOSIS — Z79899 Other long term (current) drug therapy: Secondary | ICD-10-CM | POA: Diagnosis not present

## 2022-01-23 DIAGNOSIS — D751 Secondary polycythemia: Secondary | ICD-10-CM | POA: Diagnosis not present

## 2022-01-23 LAB — CBC WITH DIFFERENTIAL (CANCER CENTER ONLY)
Abs Immature Granulocytes: 0.09 10*3/uL — ABNORMAL HIGH (ref 0.00–0.07)
Basophils Absolute: 0.1 10*3/uL (ref 0.0–0.1)
Basophils Relative: 1 %
Eosinophils Absolute: 0.3 10*3/uL (ref 0.0–0.5)
Eosinophils Relative: 2 %
HCT: 47.5 % (ref 39.0–52.0)
Hemoglobin: 15.5 g/dL (ref 13.0–17.0)
Immature Granulocytes: 1 %
Lymphocytes Relative: 9 %
Lymphs Abs: 1.3 10*3/uL (ref 0.7–4.0)
MCH: 33.5 pg (ref 26.0–34.0)
MCHC: 32.6 g/dL (ref 30.0–36.0)
MCV: 102.6 fL — ABNORMAL HIGH (ref 80.0–100.0)
Monocytes Absolute: 0.6 10*3/uL (ref 0.1–1.0)
Monocytes Relative: 5 %
Neutro Abs: 11.6 10*3/uL — ABNORMAL HIGH (ref 1.7–7.7)
Neutrophils Relative %: 82 %
Platelet Count: 544 10*3/uL — ABNORMAL HIGH (ref 150–400)
RBC: 4.63 MIL/uL (ref 4.22–5.81)
RDW: 13.3 % (ref 11.5–15.5)
WBC Count: 14 10*3/uL — ABNORMAL HIGH (ref 4.0–10.5)
nRBC: 0 % (ref 0.0–0.2)

## 2022-01-23 LAB — CMP (CANCER CENTER ONLY)
ALT: 14 U/L (ref 0–44)
AST: 17 U/L (ref 15–41)
Albumin: 4.2 g/dL (ref 3.5–5.0)
Alkaline Phosphatase: 62 U/L (ref 38–126)
Anion gap: 9 (ref 5–15)
BUN: 16 mg/dL (ref 8–23)
CO2: 26 mmol/L (ref 22–32)
Calcium: 9.7 mg/dL (ref 8.9–10.3)
Chloride: 107 mmol/L (ref 98–111)
Creatinine: 1.26 mg/dL — ABNORMAL HIGH (ref 0.61–1.24)
GFR, Estimated: 58 mL/min — ABNORMAL LOW (ref >60)
Glucose, Bld: 114 mg/dL — ABNORMAL HIGH (ref 70–99)
Potassium: 4 mmol/L (ref 3.5–5.1)
Sodium: 142 mmol/L (ref 135–145)
Total Bilirubin: 0.7 mg/dL (ref 0.3–1.2)
Total Protein: 7 g/dL (ref 6.5–8.1)

## 2022-01-23 NOTE — Progress Notes (Signed)
Hematology and Oncology Follow Up Visit ? ?Dillon [name] ?[id number] ?11/24/1945 76 y.o. ?01/23/2022 9:10 AM ? ?CC: Biagio Borg, MD  ? ? ?Principle Diagnosis: 76 year old man with JAK2 positive essential thrombocythemia diagnosed in 2010.   ? ? ?Current therapy:   Hydroxyurea 500 mg daily started in March 2021.  His dose was increased to 1000 mg started in May 2022. ? ?Interim History: Dillon [name] is here for a follow-up visit.  Since last visit, he reports no major changes in his health.  He continues to tolerate hydroxyurea at the current dose without any complaints.  He denies nausea, vomiting or bleeding complications.  He denies any hospitalizations or illnesses.  He denies any excessive bruising or fatigue.  He denies any skin rashes or pruritus. ? ?Medications: Updated on review. ? ?Current Outpatient Medications  ?Medication Sig Dispense Refill  ? carvedilol (COREG) 12.5 MG tablet TAKE 1 TABLET (12.5 MG TOTAL) BY MOUTH 2 (TWO) TIMES DAILY WITH A MEAL. 60 tablet 5  ? fluticasone (FLONASE) 50 MCG/ACT nasal spray Place 2 sprays into both nostrils daily. 16 g 6  ? furosemide (LASIX) 20 MG tablet TAKE 1 TABLET BY MOUTH DAILY AS NEEDED FOR EDEMA.PLEASE CALL TO SCHEDULE OVERDUE APPT WITH DR KELLEY 30 tablet 0  ? hydroxyurea (HYDREA) 500 MG capsule Take 2 capsules (1,000 mg total) by mouth daily. May take with food to minimize GI side effects. 120 capsule 3  ? lisinopril (ZESTRIL) 2.5 MG tablet TAKE 1 TABLET BY MOUTH EVERY DAY 90 tablet 0  ? lovastatin (MEVACOR) 40 MG tablet TAKE 1 TABLET BY MOUTH EVERY DAY 90 tablet 0  ? tadalafil (CIALIS) 20 MG tablet Take 0.5-1 tablets (10-20 mg total) by mouth every other day as needed for erectile dysfunction. 10 tablet 11  ? tamsulosin (FLOMAX) 0.4 MG CAPS capsule TAKE 1 CAPSULE BY MOUTH EVERY DAY 90 capsule 1  ? tiZANidine (ZANAFLEX) 2 MG tablet Take 1 tablet (2 mg total) by mouth every 6 (six) hours as needed for muscle spasms. 30 tablet 0  ? Vitamin D, Ergocalciferol,  (DRISDOL) 1.25 MG (50000 UT) CAPS capsule Take 1 capsule (50,000 Units total) by mouth every 7 (seven) days. 12 capsule 0  ? ?No current facility-administered medications for this visit.  ? ? ?Allergies: No Known Allergies ? ? ? ? ?Physical Exam: ? ? ? ?Blood pressure 140/71, pulse 71, temperature 97.7 ?F (36.5 ?C), temperature source Temporal, resp. rate 16, height '5\' 9"'$  (1.753 m), weight 173 lb 6.4 oz (78.7 kg), SpO2 100 %. ? ? ? ?ECOG: 1 ? ? ? ?General appearance: Comfortable appearing without any discomfort ?Head: Normocephalic without any trauma ?Oropharynx: Mucous membranes are moist and pink without any thrush or ulcers. ?Eyes: Pupils are equal and round reactive to light. ?Lymph nodes: No cervical, supraclavicular, inguinal or axillary lymphadenopathy.   ?Heart:regular rate and rhythm.  S1 and S2 without leg edema. ?Lung: Clear without any rhonchi or wheezes.  No dullness to percussion. ?Abdomin: Soft, nontender, nondistended with good bowel sounds.  No hepatosplenomegaly. ?Musculoskeletal: No joint deformity or effusion.  Full range of motion noted. ?Neurological: No deficits noted on motor, sensory and deep tendon reflex exam. ?Skin: No petechial rash or dryness.  Appeared moist.  ? ? ? ? ? ?Lab Results: ?Lab Results  ?Component Value Date  ? WBC 14.0 (H) 01/23/2022  ? HGB 15.5 01/23/2022  ? HCT 47.5 01/23/2022  ? MCV 102.6 (H) 01/23/2022  ? PLT 544 (H) 01/23/2022  ? ?  Chemistry   ?   ?Component Value Date/Time  ? NA 140 09/26/2021 0959  ? NA 141 11/03/2015 1543  ? K 4.3 09/26/2021 0959  ? K 4.3 11/03/2015 1543  ? CL 105 09/26/2021 0959  ? CO2 29 09/26/2021 0959  ? CO2 24 11/03/2015 1543  ? BUN 16 09/26/2021 0959  ? BUN 16.5 11/03/2015 1543  ? CREATININE 1.21 09/26/2021 0959  ? CREATININE 1.2 11/03/2015 1543  ?    ?Component Value Date/Time  ? CALCIUM 9.4 09/26/2021 0959  ? CALCIUM 9.4 11/03/2015 1543  ? ALKPHOS 60 09/26/2021 0959  ? ALKPHOS 74 11/03/2015 1543  ? AST 16 09/26/2021 0959  ? AST 21  11/03/2015 1543  ? ALT 12 09/26/2021 0959  ? ALT 29 11/03/2015 1543  ? BILITOT 1.7 (H) 09/26/2021 0959  ? BILITOT 0.78 11/03/2015 1543  ?  ? ? ?Assessment and plan: ? ?  76 year old man with ? ?1.  Myeloproliferative disorder diagnosed in 2010.  He was found to have JAK2 positive essential thrombocythemia. ? ?Laboratory data from today reviewed and showed continued excellent response to his platelet count currently at 544.  Risks and benefits of continuing hydroxyurea were reviewed.  Complications that include arthralgias, myalgias, dermatological toxicity as well as bone marrow complications were reiterated.  At this time I recommended continuing the same dose and schedule and he is agreeable. ? ?2.  Thrombosis prophylaxis: Risk of thrombosis assessed at this time.  His white cell count remains under reasonable control without any previous history of thrombosis.  His risk of thrombosis remains low. ? ?3.  Polycythemia: No phlebotomy needed at this time with hemoglobin adequately controlled. ? ?4.  Follow-up: 4 months for repeat evaluation. ? ?30  minutes were spent on this visit.  The time was dedicated to reviewing laboratory data, disease status update outlining future plan of care discussion. ? ? ? ?Zola Button ?5/10/20239:10 AM  ?

## 2022-01-24 ENCOUNTER — Ambulatory Visit (INDEPENDENT_AMBULATORY_CARE_PROVIDER_SITE_OTHER): Payer: 59 | Admitting: Internal Medicine

## 2022-01-24 ENCOUNTER — Encounter: Payer: Self-pay | Admitting: Internal Medicine

## 2022-01-24 ENCOUNTER — Ambulatory Visit: Payer: 59 | Admitting: Internal Medicine

## 2022-01-24 VITALS — BP 136/72 | HR 85 | Temp 98.3°F | Ht 69.0 in | Wt 174.0 lb

## 2022-01-24 DIAGNOSIS — Z125 Encounter for screening for malignant neoplasm of prostate: Secondary | ICD-10-CM | POA: Diagnosis not present

## 2022-01-24 DIAGNOSIS — Z0001 Encounter for general adult medical examination with abnormal findings: Secondary | ICD-10-CM

## 2022-01-24 DIAGNOSIS — E7800 Pure hypercholesterolemia, unspecified: Secondary | ICD-10-CM

## 2022-01-24 DIAGNOSIS — K4090 Unilateral inguinal hernia, without obstruction or gangrene, not specified as recurrent: Secondary | ICD-10-CM | POA: Diagnosis not present

## 2022-01-24 DIAGNOSIS — N1831 Chronic kidney disease, stage 3a: Secondary | ICD-10-CM

## 2022-01-24 DIAGNOSIS — E538 Deficiency of other specified B group vitamins: Secondary | ICD-10-CM

## 2022-01-24 DIAGNOSIS — E559 Vitamin D deficiency, unspecified: Secondary | ICD-10-CM

## 2022-01-24 DIAGNOSIS — I10 Essential (primary) hypertension: Secondary | ICD-10-CM

## 2022-01-24 DIAGNOSIS — N529 Male erectile dysfunction, unspecified: Secondary | ICD-10-CM

## 2022-01-24 DIAGNOSIS — R7302 Impaired glucose tolerance (oral): Secondary | ICD-10-CM

## 2022-01-24 MED ORDER — TADALAFIL 20 MG PO TABS: 10.0000 mg | ORAL_TABLET | ORAL | 11 refills | Status: DC | PRN

## 2022-01-24 MED ORDER — CHOLECALCIFEROL 50 MCG (2000 UT) PO TABS: ORAL_TABLET | ORAL | 99 refills | Status: DC

## 2022-01-24 NOTE — Progress Notes (Signed)
Patient ID: Dillon [name], male   DOB: Nov 17, 1945, 76 y.o.   MRN: [id number] ? ? ? ?     Chief Complaint:: wellness exam and low vit d, ED and new left inguinal hernia ? ?     HPI:  Dillon [name] is a 76 y.o. male here for wellness exam; already up to date.  Still working mostly full time ?         ?              Also has new left inguinal swelling without pain or tenderness after lfiting heavy object recently, goes away to lie down, but comes back during the day. Denies worsening reflux, abd pain, dysphagia, n/v, bowel change or blood.  Denies urinary symptoms such as dysuria, frequency, urgency, flank pain, hematuria or n/v, fever, chills.  Does have ongoing ED and asks for med refill.  Not taking Vit d.  Pt denies chest pain, increased sob or doe, wheezing, orthopnea, PND, increased LE swelling, palpitations, dizziness or syncope.   Pt denies polydipsia, polyuria, or new focal neuro s/s.    Pt denies fever, wt loss, night sweats, loss of appetite, or other constitutional symptoms  No other new complaints ?  ?Wt Readings from Last 3 Encounters:  ?01/24/22 174 lb (78.9 kg)  ?01/23/22 173 lb 6.4 oz (78.7 kg)  ?09/26/21 177 lb (80.3 kg)  ? ?BP Readings from Last 3 Encounters:  ?01/24/22 136/72  ?01/23/22 140/71  ?09/26/21 (!) 143/99  ? ?Immunization History  ?Administered Date(s) Administered  ? H1N1 11/15/2008  ? Influenza Split 07/28/2012  ? Influenza Whole 11/29/2009  ? Influenza, High Dose Seasonal PF 05/18/2015, 05/01/2017, 06/16/2018, 05/06/2019, 05/12/2020, 07/04/2021  ? Influenza, Seasonal, Injecte, Preservative Fre 06/16/2013  ? Influenza,inj,Quad PF,6+ Mos 05/06/2014  ? Influenza-Unspecified 05/12/2017, 05/08/2019  ? PFIZER Comirnaty(Gray Top)Covid-19 Tri-Sucrose Vaccine 01/10/2021  ? PFIZER(Purple Top)SARS-COV-2 Vaccination 10/16/2019, 11/05/2019, 06/18/2020, 07/02/2021  ? Pension scheme manager 78yr & up 10/04/2021  ? Pneumococcal Conjugate-13 07/29/2013  ? Pneumococcal  Polysaccharide-23 03/04/2007, 05/04/2015  ? Td 11/15/2008  ? Tdap 05/14/2019  ? Zoster Recombinat (Shingrix) 05/12/2020, 07/13/2020  ? Zoster, Live 03/04/2007  ?There are no preventive care reminders to display for this patient. ?  ? ?Past Medical History:  ?Diagnosis Date  ? Arthritis   ? right shoulder  ? Bladder neck obstruction 04/18/2011  ? CKD (chronic kidney disease), stage III ([place] 09/12/2015  ? ERECTILE DYSFUNCTION 11/15/2008  ? Essential hypertension, benign 04/28/2013  ? FATIGUE 11/15/2008  ? GERD 11/15/2008  ? History of adenomatous polyp of colon 05/06/2014  ? 2010  ? HYPERLIPIDEMIA 11/15/2008  ? Impaired glucose tolerance 04/18/2011  ? LEG PAIN, LEFT 11/05/2010  ? LVH (left ventricular hypertrophy) 04/27/2012  ? MIGRAINE, COMMON 08/06/2010  ? Nocturia 12/19/2008  ? Polycythemia 04/27/2012  ? Swelling of limb 11/05/2010  ? THROMBOCYTOSIS 12/19/2008  ? myleoproliferative disorder  ? ?Past Surgical History:  ?Procedure Laterality Date  ? APPENDECTOMY    ? CARDIAC CATHETERIZATION N/A 09/05/2015  ? Procedure: Left Heart Cath and Coronary Angiography;  Surgeon: TTroy Sine MD;  Location: [facility];  Service: Cardiovascular;  Laterality: N/A;  ? CIRCUMCISION    ? 30+yrs ago  ? COLONOSCOPY    ? POLYPECTOMY    ? s/p esophageal dilation    ? TONSILLECTOMY    ? UPPER GASTROINTESTINAL ENDOSCOPY    ? ? reports that he quit smoking about 51 years ago. His smoking use included cigarettes. He  has a 6.00 pack-year smoking history. He has never used smokeless tobacco. He reports current alcohol use. He reports that he does not use drugs. ?family history includes Cancer in his father; Hypertension in his brother, mother, and sister; Prostate cancer in his father. ?No Known Allergies ?Current Outpatient Medications on File Prior to Visit  ?Medication Sig Dispense Refill  ? carvedilol (COREG) 12.5 MG tablet TAKE 1 TABLET (12.5 MG TOTAL) BY MOUTH 2 (TWO) TIMES DAILY WITH A MEAL. 60 tablet 5  ? fluticasone (FLONASE) 50 MCG/ACT nasal  spray Place 2 sprays into both nostrils daily. 16 g 6  ? furosemide (LASIX) 20 MG tablet TAKE 1 TABLET BY MOUTH DAILY AS NEEDED FOR EDEMA.PLEASE CALL TO SCHEDULE OVERDUE APPT WITH DR KELLEY 30 tablet 0  ? hydroxyurea (HYDREA) 500 MG capsule Take 2 capsules (1,000 mg total) by mouth daily. May take with food to minimize GI side effects. 120 capsule 3  ? lisinopril (ZESTRIL) 2.5 MG tablet TAKE 1 TABLET BY MOUTH EVERY DAY 90 tablet 0  ? lovastatin (MEVACOR) 40 MG tablet TAKE 1 TABLET BY MOUTH EVERY DAY 90 tablet 0  ? tamsulosin (FLOMAX) 0.4 MG CAPS capsule TAKE 1 CAPSULE BY MOUTH EVERY DAY 90 capsule 1  ? tiZANidine (ZANAFLEX) 2 MG tablet Take 1 tablet (2 mg total) by mouth every 6 (six) hours as needed for muscle spasms. 30 tablet 0  ? ?No current facility-administered medications on file prior to visit.  ? ?     ROS:  All others reviewed and negative. ? ?Objective  ? ?     PE:  BP 136/72 (BP Location: Right Arm, Patient Position: Sitting, Cuff Size: Normal)   Pulse 85   Temp 98.3 ?F (36.8 ?C) (Oral)   Ht '5\' 9"'$  (1.753 m)   Wt 174 lb (78.9 kg)   SpO2 99%   BMI 25.70 kg/m?  ? ?              Constitutional: Pt appears in NAD ?              HENT: Head: NCAT.  ?              Right Ear: External ear normal.   ?              Left Ear: External ear normal.  ?              Eyes: . Pupils are equal, round, and reactive to light. Conjunctivae and EOM are normal ?              Nose: without d/c or deformity ?              Neck: Neck supple. Gross normal ROM ?              Cardiovascular: Normal rate and regular rhythm.   ?              Pulmonary/Chest: Effort normal and breath sounds without rales or wheezing.  ?              Abd:  Soft, NT, ND, + BS, no organomegaly; has small reducible nontender LIH noted ?              Neurological: Pt is alert. At baseline orientation, motor grossly intact ?              Skin: Skin is warm. No rashes, no other new lesions, LE edema - none ?  Psychiatric: Pt behavior is  normal without agitation  ? ?Micro: none ? ?Cardiac tracings I have personally interpreted today:  none ? ?Pertinent Radiological findings (summarize): none  ? ?Lab Results  ?Component Value Date  ? WBC 14.0 (H) 01/23/2022  ? HGB 15.5 01/23/2022  ? HCT 47.5 01/23/2022  ? PLT 544 (H) 01/23/2022  ? GLUCOSE 114 (H) 01/23/2022  ? CHOL 143 01/24/2022  ? TRIG 139.0 01/24/2022  ? HDL 67.30 01/24/2022  ? LDLDIRECT 57.0 12/15/2017  ? [place] 48 01/24/2022  ? ALT 14 01/23/2022  ? AST 17 01/23/2022  ? NA 142 01/23/2022  ? K 4.0 01/23/2022  ? CL 107 01/23/2022  ? CREATININE 1.26 (H) 01/23/2022  ? BUN 16 01/23/2022  ? CO2 26 01/23/2022  ? TSH 3.17 01/24/2022  ? PSA 2.31 01/24/2022  ? INR 1.32 09/04/2015  ? HGBA1C 4.7 01/24/2022  ? ?Assessment/Plan:  ?Dillon [name] is a 76 y.o. Black or African American [2] male with  has a past medical history of Arthritis, Bladder neck obstruction (04/18/2011), CKD (chronic kidney disease), stage III ([place]) (09/12/2015), ERECTILE DYSFUNCTION (11/15/2008), Essential hypertension, benign (04/28/2013), FATIGUE (11/15/2008), GERD (11/15/2008), History of adenomatous polyp of colon (05/06/2014), HYPERLIPIDEMIA (11/15/2008), Impaired glucose tolerance (04/18/2011), LEG PAIN, LEFT (11/05/2010), LVH (left ventricular hypertrophy) (04/27/2012), MIGRAINE, COMMON (08/06/2010), Nocturia (12/19/2008), Polycythemia (04/27/2012), Swelling of limb (11/05/2010), and THROMBOCYTOSIS (12/19/2008). ? ?Vitamin D deficiency ?Last vitamin D ?Lab Results  ?Component Value Date  ? VD25OH 34.47 11/07/2020  ? ?Low, to start oral replacement ? ? ?Encounter for well adult exam with abnormal findings ?Age and sex appropriate education and counseling updated with regular exercise and diet ?Referrals for preventative services - none needed ?Immunizations addressed - none needed ?Smoking counseling  - none needed ?Evidence for depression or other mood disorder - none significant ?Most recent labs reviewed. ?I have personally reviewed and have  noted: ?1) the patient's medical and social history ?2) The patient's current medications and supplements ?3) The patient's height, weight, and BMI have been recorded in the chart ? ? ?CKD (chronic kidney disease), stage

## 2022-01-24 NOTE — Patient Instructions (Signed)
Please take OTC Vitamin D3 at 2000 units per day, indefinitely, or 4000 units if you already take the 2000 units.   ? ?Please continue all other medications as before, and refills have been done if requested - the cialis ? ?Please have the pharmacy call with any other refills you may need. ? ?Please continue your efforts at being more active, low cholesterol diet, and weight control. ? ?You are otherwise up to date with prevention measures today. ? ?Please keep your appointments with your specialists as you may have planned ? ?Please go to the LAB at the blood drawing area for the tests to be done ? ?You will be contacted by phone if any changes need to be made immediately.  Otherwise, you will receive a letter about your results with an explanation, but please check with MyChart first. ? ?Please remember to sign up for MyChart if you have not done so, as this will be important to you in the future with finding out test results, communicating by private email, and scheduling acute appointments online when needed. ? ?Please make an Appointment to return for your 1 year visit, or sooner if needed ?

## 2022-01-24 NOTE — Assessment & Plan Note (Signed)
Last vitamin D ?Lab Results  ?Component Value Date  ? VD25OH 34.47 11/07/2020  ? ?Low, to start oral replacement ? ?

## 2022-01-25 ENCOUNTER — Encounter: Payer: Self-pay | Admitting: Internal Medicine

## 2022-01-25 ENCOUNTER — Telehealth: Payer: Self-pay | Admitting: *Deleted

## 2022-01-25 LAB — URINALYSIS, ROUTINE W REFLEX MICROSCOPIC
Hgb urine dipstick: NEGATIVE
Ketones, ur: NEGATIVE
Leukocytes,Ua: NEGATIVE
Nitrite: NEGATIVE
RBC / HPF: NONE SEEN (ref 0–?)
Specific Gravity, Urine: >=1.03 — AB (ref 1.000–1.030)
Total Protein, Urine: 30 — AB
Urine Glucose: NEGATIVE
Urobilinogen, UA: 1 (ref 0.0–1.0)
pH: 5.5 (ref 5.0–8.0)

## 2022-01-25 LAB — LIPID PANEL
Cholesterol: 143 mg/dL (ref 0–200)
HDL: 67.3 mg/dL (ref >39.00)
LDL Cholesterol: 48 mg/dL (ref 0–99)
NonHDL: 75.65
Total CHOL/HDL Ratio: 2
Triglycerides: 139 mg/dL (ref 0.0–149.0)
VLDL: 27.8 mg/dL (ref 0.0–40.0)

## 2022-01-25 LAB — PSA: PSA: 2.31 ng/mL (ref 0.10–4.00)

## 2022-01-25 LAB — VITAMIN D 25 HYDROXY (VIT D DEFICIENCY, FRACTURES): VITD: 40.25 ng/mL (ref 30.00–100.00)

## 2022-01-25 LAB — VITAMIN B12: Vitamin B-12: 445 pg/mL (ref 211–911)

## 2022-01-25 LAB — TSH: TSH: 3.17 u[IU]/mL (ref 0.35–5.50)

## 2022-01-25 LAB — HEMOGLOBIN A1C: Hgb A1c MFr Bld: 4.7 % (ref 4.6–6.5)

## 2022-01-25 NOTE — Telephone Encounter (Signed)
Rec'd PA for tadalafil (CIALIS) 20 MG tablet Had to compete online [email or web address]. Completed questionnaire. Check PA will need POC ID# [id number].../lmb  ?

## 2022-01-27 ENCOUNTER — Encounter: Payer: Self-pay | Admitting: Internal Medicine

## 2022-01-27 NOTE — Assessment & Plan Note (Signed)
Lab Results  ?Component Value Date  ? HGBA1C 4.7 01/24/2022  ? ?Stable, pt to continue current medical treatment - diet ? ?

## 2022-01-27 NOTE — Assessment & Plan Note (Signed)
BP Readings from Last 3 Encounters:  ?01/24/22 136/72  ?01/23/22 140/71  ?09/26/21 (!) 143/99  ? ?Stable, pt to continue medical treatment coreg lisinopril ? ?

## 2022-01-27 NOTE — Assessment & Plan Note (Signed)
Stable, for med refill prn use ?

## 2022-01-27 NOTE — Assessment & Plan Note (Signed)

## 2022-01-27 NOTE — Assessment & Plan Note (Signed)
Lab Results  ?Component Value Date  ? Jessie 48 01/24/2022  ? ?Stable, pt to continue current statin lovastatin ? ?

## 2022-01-27 NOTE — Assessment & Plan Note (Signed)
Lab Results  ?Component Value Date  ? CREATININE 1.26 (H) 01/23/2022  ? ?Stable overall, cont to avoid nephrotoxins ? ?

## 2022-01-27 NOTE — Assessment & Plan Note (Signed)
Recent onset, asympt, d/w pt - declines general surgury referral ?

## 2022-01-30 ENCOUNTER — Other Ambulatory Visit: Payer: Self-pay | Admitting: Internal Medicine

## 2022-01-30 NOTE — Telephone Encounter (Signed)
Please refill as per office routine med refill policy (all routine meds to be refilled for 3 mo or monthly (per pt preference) up to one year from last visit, then month to month grace period for 3 mo, then further med refills will have to be denied) ? ?

## 2022-02-20 ENCOUNTER — Telehealth: Payer: Self-pay | Admitting: *Deleted

## 2022-02-20 NOTE — Telephone Encounter (Signed)
Pt was on cover-my-meds.. rec'd mag must go to [email or web address] to do PA. Went to website w/ [place] ID# [id number]. Waiting on insurance response...Dillon [name]

## 2022-02-21 NOTE — Telephone Encounter (Signed)
Check status on PA rec'd msg still pending!!../lmb

## 2022-02-22 NOTE — Telephone Encounter (Signed)
Now not able to pull up pt under EOC#. Submitted PA under 2nd insurance w/ (Key: [id number]). Rec'd msg Your information has been submitted to [facility]. [employer] will review the request and will issue a decision, typically within 3-7 days from your submission...Chryl Heck

## 2022-02-22 NOTE — Telephone Encounter (Signed)
Check PA status  Drug/Service Name:TADALAFIL 20 MG TABLET- physician/Nurse: Cathlean Cower EOC ID: [id number] Status: In Progress .Marland KitchenWILL CHECK LATER.Marland Kitchen/LMB

## 2022-02-25 NOTE — Telephone Encounter (Signed)
Rec'd determination fax from [facility] was " DENIED" it states medicare rule in prescription drug plan erectile dysfunction ae excluded from Part D. No other alternatives given//lmb

## 2022-04-07 ENCOUNTER — Other Ambulatory Visit: Payer: Self-pay | Admitting: Oncology

## 2022-04-08 ENCOUNTER — Other Ambulatory Visit: Payer: Self-pay | Admitting: *Deleted

## 2022-04-08 DIAGNOSIS — D75839 Thrombocytosis, unspecified: Secondary | ICD-10-CM

## 2022-04-08 MED ORDER — HYDROXYUREA 500 MG PO CAPS: 1000.0000 mg | ORAL_CAPSULE | Freq: Every day | ORAL | 3 refills | Status: DC

## 2022-04-09 NOTE — Telephone Encounter (Signed)
Refilled yesterday. Deazia Lampi M Keyani Rigdon, RN  

## 2022-05-29 ENCOUNTER — Inpatient Hospital Stay: Payer: 59 | Attending: Oncology

## 2022-05-29 ENCOUNTER — Other Ambulatory Visit: Payer: Self-pay

## 2022-05-29 ENCOUNTER — Inpatient Hospital Stay (HOSPITAL_BASED_OUTPATIENT_CLINIC_OR_DEPARTMENT_OTHER): Payer: 59 | Admitting: Oncology

## 2022-05-29 VITALS — BP 136/84 | HR 96 | Temp 98.1°F | Resp 17 | Ht 69.0 in | Wt 172.1 lb

## 2022-05-29 DIAGNOSIS — D75839 Thrombocytosis, unspecified: Secondary | ICD-10-CM | POA: Insufficient documentation

## 2022-05-29 DIAGNOSIS — Z79899 Other long term (current) drug therapy: Secondary | ICD-10-CM | POA: Diagnosis not present

## 2022-05-29 DIAGNOSIS — D751 Secondary polycythemia: Secondary | ICD-10-CM | POA: Insufficient documentation

## 2022-05-29 LAB — CBC WITH DIFFERENTIAL (CANCER CENTER ONLY)
Abs Immature Granulocytes: 0.07 10*3/uL (ref 0.00–0.07)
Basophils Absolute: 0.1 10*3/uL (ref 0.0–0.1)
Basophils Relative: 1 %
Eosinophils Absolute: 0.2 10*3/uL (ref 0.0–0.5)
Eosinophils Relative: 2 %
HCT: 50.6 % (ref 39.0–52.0)
Hemoglobin: 16.1 g/dL (ref 13.0–17.0)
Immature Granulocytes: 1 %
Lymphocytes Relative: 10 %
Lymphs Abs: 1.4 10*3/uL (ref 0.7–4.0)
MCH: 32.1 pg (ref 26.0–34.0)
MCHC: 31.8 g/dL (ref 30.0–36.0)
MCV: 101 fL — ABNORMAL HIGH (ref 80.0–100.0)
Monocytes Absolute: 0.7 10*3/uL (ref 0.1–1.0)
Monocytes Relative: 5 %
Neutro Abs: 12.1 10*3/uL — ABNORMAL HIGH (ref 1.7–7.7)
Neutrophils Relative %: 81 %
Platelet Count: 622 10*3/uL — ABNORMAL HIGH (ref 150–400)
RBC: 5.01 MIL/uL (ref 4.22–5.81)
RDW: 16.9 % — ABNORMAL HIGH (ref 11.5–15.5)
WBC Count: 14.5 10*3/uL — ABNORMAL HIGH (ref 4.0–10.5)
nRBC: 0 % (ref 0.0–0.2)

## 2022-05-29 LAB — CMP (CANCER CENTER ONLY)
ALT: 21 U/L (ref 0–44)
AST: 23 U/L (ref 15–41)
Albumin: 4.3 g/dL (ref 3.5–5.0)
Alkaline Phosphatase: 56 U/L (ref 38–126)
Anion gap: 6 (ref 5–15)
BUN: 17 mg/dL (ref 8–23)
CO2: 26 mmol/L (ref 22–32)
Calcium: 9.7 mg/dL (ref 8.9–10.3)
Chloride: 108 mmol/L (ref 98–111)
Creatinine: 1.12 mg/dL (ref 0.61–1.24)
GFR, Estimated: >60 mL/min (ref >60)
Glucose, Bld: 89 mg/dL (ref 70–99)
Potassium: 4.2 mmol/L (ref 3.5–5.1)
Sodium: 140 mmol/L (ref 135–145)
Total Bilirubin: 1 mg/dL (ref 0.3–1.2)
Total Protein: 7 g/dL (ref 6.5–8.1)

## 2022-05-29 NOTE — Progress Notes (Signed)
Hematology and Oncology Follow Up Visit  Dillon [name] [id number] 10-Mar-1946 76 y.o. 05/29/2022 2:41 PM  CC: Dillon Borg, MD    Principle Diagnosis: 76 year old man with essential thrombocythemia presented with elevated platelet count and JAK2 positive mutation in 2010.     Current therapy:   Hydroxyurea 500 mg daily started in March 2021.  His dose was increased to 1000 mg started in May 2022.  Interim History: Dillon [name] presents today for a follow-up visit.  Since the last visit, he reports feeling well without any major complaints.  He has tolerated hydroxyurea without any major complaints.  He denies any nausea, fatigue or bleeding.  He denies any pruritus.  Medications: Reviewed without changes.  Current Outpatient Medications  Medication Sig Dispense Refill   lisinopril (ZESTRIL) 2.5 MG tablet TAKE 1 TABLET BY MOUTH EVERY DAY 90 tablet 3   lovastatin (MEVACOR) 40 MG tablet TAKE 1 TABLET BY MOUTH EVERY DAY 90 tablet 3   carvedilol (COREG) 12.5 MG tablet TAKE 1 TABLET (12.5 MG TOTAL) BY MOUTH 2 (TWO) TIMES DAILY WITH A MEAL. 60 tablet 5   Cholecalciferol 50 MCG (2000 UT) TABS 1 tab by mouth once daily 30 tablet 99   fluticasone (FLONASE) 50 MCG/ACT nasal spray Place 2 sprays into both nostrils daily. 16 g 6   furosemide (LASIX) 20 MG tablet TAKE 1 TABLET BY MOUTH DAILY AS NEEDED FOR EDEMA.PLEASE CALL TO SCHEDULE OVERDUE APPT WITH DR KELLEY 30 tablet 0   hydroxyurea (HYDREA) 500 MG capsule Take 2 capsules (1,000 mg total) by mouth daily. May take with food to minimize GI side effects. 120 capsule 3   tadalafil (CIALIS) 20 MG tablet Take 0.5-1 tablets (10-20 mg total) by mouth every other day as needed for erectile dysfunction. 10 tablet 11   tamsulosin (FLOMAX) 0.4 MG CAPS capsule TAKE 1 CAPSULE BY MOUTH EVERY DAY 90 capsule 1   tiZANidine (ZANAFLEX) 2 MG tablet Take 1 tablet (2 mg total) by mouth every 6 (six) hours as needed for muscle spasms. 30 tablet 0   No current  facility-administered medications for this visit.    Allergies: No Known Allergies     Physical Exam:    Blood pressure 136/84, pulse 96, temperature 98.1 F (36.7 C), temperature source Temporal, resp. rate 17, height '5\' 9"'$  (1.753 m), weight 172 lb 1.6 oz (78.1 kg), SpO2 99 %.     ECOG: 1   General appearance: Alert, awake without any distress. Head: Atraumatic without abnormalities Oropharynx: Without any thrush or ulcers. Eyes: No scleral icterus. Lymph nodes: No lymphadenopathy noted in the cervical, supraclavicular, or axillary nodes Heart:regular rate and rhythm, without any murmurs or gallops.   Lung: Clear to auscultation without any rhonchi, wheezes or dullness to percussion. Abdomin: Soft, nontender without any shifting dullness or ascites. Musculoskeletal: No clubbing or cyanosis. Neurological: No motor or sensory deficits. Skin: No rashes or lesions.       Lab Results: Lab Results  Component Value Date   WBC 14.0 (H) 01/23/2022   HGB 15.5 01/23/2022   HCT 47.5 01/23/2022   MCV 102.6 (H) 01/23/2022   PLT 544 (H) 01/23/2022     Chemistry      Component Value Date/Time   NA 142 01/23/2022 0858   NA 141 11/03/2015 1543   K 4.0 01/23/2022 0858   K 4.3 11/03/2015 1543   CL 107 01/23/2022 0858   CO2 26 01/23/2022 0858   CO2 24 11/03/2015 1543   BUN 16  01/23/2022 0858   BUN 16.5 11/03/2015 1543   CREATININE 1.26 (H) 01/23/2022 0858   CREATININE 1.2 11/03/2015 1543      Component Value Date/Time   CALCIUM 9.7 01/23/2022 0858   CALCIUM 9.4 11/03/2015 1543   ALKPHOS 62 01/23/2022 0858   ALKPHOS 74 11/03/2015 1543   AST 17 01/23/2022 0858   AST 21 11/03/2015 1543   ALT 14 01/23/2022 0858   ALT 29 11/03/2015 1543   BILITOT 0.7 01/23/2022 0858   BILITOT 0.78 11/03/2015 1543      Assessment and plan:    76 year old man with  1.  JAK2 positive myeloproliferative disorder presented with thrombocytosis in 2010.    The natural course of this  disease was reviewed risks and benefits of continuing hydroxyurea were discussed.  Potential complications associated with treatment were discussed.  These include myelosuppression, GI toxicity among others.  He is agreeable to continue and I recommended the same dose and schedule given his overall controlled counts close to platelet count of 600.  2.  Thrombosis prophylaxis: He is at low risk of thrombosis with white cell count under control.  3.  Polycythemia: Now need for phlebotomy at this time we will continue to monitor.  4.  Follow-up: In 6 months for follow-up.  30  minutes were dedicated to this encounter.  The time was spent on reviewing laboratory data, disease status update and outlining future plan of care discussion.   Dillon [name] 9/13/20232:41 PM

## 2022-05-31 ENCOUNTER — Other Ambulatory Visit: Payer: Self-pay | Admitting: Internal Medicine

## 2022-05-31 NOTE — Telephone Encounter (Signed)
Please refill as per office routine med refill policy (all routine meds to be refilled for 3 mo or monthly (per pt preference) up to one year from last visit, then month to month grace period for 3 mo, then further med refills will have to be denied) ? ?

## 2022-10-01 ENCOUNTER — Telehealth: Payer: Self-pay | Admitting: Oncology

## 2022-10-01 NOTE — Telephone Encounter (Signed)
Called patient regarding providers departure, spoke with patient's sister. Patient will be notified and has been rescheduled with new provider.

## 2022-10-30 ENCOUNTER — Encounter: Payer: Self-pay | Admitting: Oncology

## 2022-11-14 ENCOUNTER — Encounter: Payer: Self-pay | Admitting: Gastroenterology

## 2022-11-22 ENCOUNTER — Encounter: Payer: Self-pay | Admitting: Oncology

## 2022-11-27 ENCOUNTER — Ambulatory Visit: Payer: 59 | Admitting: Oncology

## 2022-11-27 ENCOUNTER — Other Ambulatory Visit: Payer: 59

## 2022-11-29 ENCOUNTER — Other Ambulatory Visit: Payer: Self-pay | Admitting: Physician Assistant

## 2022-11-29 ENCOUNTER — Inpatient Hospital Stay: Payer: 59 | Attending: Physician Assistant

## 2022-11-29 ENCOUNTER — Inpatient Hospital Stay (HOSPITAL_BASED_OUTPATIENT_CLINIC_OR_DEPARTMENT_OTHER): Payer: 59 | Admitting: Physician Assistant

## 2022-11-29 ENCOUNTER — Other Ambulatory Visit: Payer: Self-pay

## 2022-11-29 VITALS — BP 145/87 | HR 77 | Temp 97.5°F | Resp 14 | Wt 168.6 lb

## 2022-11-29 DIAGNOSIS — D473 Essential (hemorrhagic) thrombocythemia: Secondary | ICD-10-CM | POA: Diagnosis not present

## 2022-11-29 DIAGNOSIS — D751 Secondary polycythemia: Secondary | ICD-10-CM

## 2022-11-29 DIAGNOSIS — D75839 Thrombocytosis, unspecified: Secondary | ICD-10-CM

## 2022-11-29 DIAGNOSIS — Z8042 Family history of malignant neoplasm of prostate: Secondary | ICD-10-CM | POA: Insufficient documentation

## 2022-11-29 DIAGNOSIS — Z87891 Personal history of nicotine dependence: Secondary | ICD-10-CM | POA: Diagnosis not present

## 2022-11-29 LAB — CBC WITH DIFFERENTIAL (CANCER CENTER ONLY)
Abs Immature Granulocytes: 0.05 10*3/uL (ref 0.00–0.07)
Basophils Absolute: 0.1 10*3/uL (ref 0.0–0.1)
Basophils Relative: 1 %
Eosinophils Absolute: 0.2 10*3/uL (ref 0.0–0.5)
Eosinophils Relative: 2 %
HCT: 53.8 % — ABNORMAL HIGH (ref 39.0–52.0)
Hemoglobin: 17.2 g/dL — ABNORMAL HIGH (ref 13.0–17.0)
Immature Granulocytes: 1 %
Lymphocytes Relative: 12 %
Lymphs Abs: 1.2 10*3/uL (ref 0.7–4.0)
MCH: 31.6 pg (ref 26.0–34.0)
MCHC: 32 g/dL (ref 30.0–36.0)
MCV: 98.9 fL (ref 80.0–100.0)
Monocytes Absolute: 0.5 10*3/uL (ref 0.1–1.0)
Monocytes Relative: 5 %
Neutro Abs: 8.2 10*3/uL — ABNORMAL HIGH (ref 1.7–7.7)
Neutrophils Relative %: 79 %
Platelet Count: 505 10*3/uL — ABNORMAL HIGH (ref 150–400)
RBC: 5.44 MIL/uL (ref 4.22–5.81)
RDW: 16.5 % — ABNORMAL HIGH (ref 11.5–15.5)
WBC Count: 10.3 10*3/uL (ref 4.0–10.5)
nRBC: 0 % (ref 0.0–0.2)

## 2022-11-29 LAB — CMP (CANCER CENTER ONLY)
ALT: 11 U/L (ref 0–44)
AST: 16 U/L (ref 15–41)
Albumin: 4.3 g/dL (ref 3.5–5.0)
Alkaline Phosphatase: 64 U/L (ref 38–126)
Anion gap: 5 (ref 5–15)
BUN: 18 mg/dL (ref 8–23)
CO2: 29 mmol/L (ref 22–32)
Calcium: 9.8 mg/dL (ref 8.9–10.3)
Chloride: 105 mmol/L (ref 98–111)
Creatinine: 1.23 mg/dL (ref 0.61–1.24)
GFR, Estimated: 60 mL/min — ABNORMAL LOW (ref >60)
Glucose, Bld: 80 mg/dL (ref 70–99)
Potassium: 4.7 mmol/L (ref 3.5–5.1)
Sodium: 139 mmol/L (ref 135–145)
Total Bilirubin: 1.1 mg/dL (ref 0.3–1.2)
Total Protein: 6.9 g/dL (ref 6.5–8.1)

## 2022-11-29 MED ORDER — HYDROXYUREA 500 MG PO CAPS: ORAL_CAPSULE | ORAL | 6 refills | Status: DC

## 2022-11-29 NOTE — Progress Notes (Signed)
[place] [phone]   [phone]  PROGRESS NOTE  Patient Care Team: Biagio Borg, MD as PCP - General Inda Castle, MD (Inactive) as Consulting Physician (Gastroenterology)  CHIEF COMPLAINTS/PURPOSE OF CONSULTATION:  JAK2 positive essential thrombocythemia.   CURRENT TREATMENT: Hydrea 1000 mg PO daily  HISTORY OF PRESENTING ILLNESS:  Dillon [name] 77 y.o. male returns for a follow up for JAK2 positive essential thrombocythemia. He was last seen by Dr. Alen Blew on 05/29/2022. He presents today to transfer care to Dr. Lorenso Courier. He is unaccompanied for this visit.   On exam today, Dillon [name] reports he is tolerating Hydrea therapy without any toxicities. His energy and appetite are stable. He is able to complete his ADLs on his own. He denies any GI symptoms including nausea, vomiting, diarrhea or constipation. He denies easy bruising or signs of active bleeding. He denies fevers, chills, sweats, shortness of breath, chest pain or cough. He has no other complaints. Rest of the 10 point ROS is below.   MEDICAL HISTORY:  Past Medical History:  Diagnosis Date   Arthritis    right shoulder   Bladder neck obstruction 04/18/2011   CKD (chronic kidney disease), stage III ([place]) 09/12/2015   ERECTILE DYSFUNCTION 11/15/2008   Essential hypertension, benign 04/28/2013   FATIGUE 11/15/2008   GERD 11/15/2008   History of adenomatous polyp of colon 05/06/2014   2010   HYPERLIPIDEMIA 11/15/2008   Impaired glucose tolerance 04/18/2011   LEG PAIN, LEFT 11/05/2010   LVH (left ventricular hypertrophy) 04/27/2012   MIGRAINE, COMMON 08/06/2010   Nocturia 12/19/2008   Polycythemia 04/27/2012   Swelling of limb 11/05/2010   THROMBOCYTOSIS 12/19/2008   myleoproliferative disorder    SURGICAL HISTORY: Past Surgical History:  Procedure Laterality Date   APPENDECTOMY     CARDIAC CATHETERIZATION N/A 09/05/2015   Procedure: Left Heart Cath and Coronary Angiography;   Surgeon: Troy Sine, MD;  Location: [facility];  Service: Cardiovascular;  Laterality: N/A;   CIRCUMCISION     30+yrs ago   COLONOSCOPY     POLYPECTOMY     s/p esophageal dilation     TONSILLECTOMY     UPPER GASTROINTESTINAL ENDOSCOPY      SOCIAL HISTORY: Social History   Socioeconomic History   Marital status: Married    Spouse name: Not on file   Number of children: Not on file   Years of education: Not on file   Highest education level: Not on file  Occupational History   Occupation: Engineer, manufacturing systems  Tobacco Use   Smoking status: Former    Packs/day: 0.50    Years: 12.00    Additional pack years: 0.00    Total pack years: 6.00    Types: Cigarettes    Quit date: 09/16/1970    Years since quitting: 52.2   Smokeless tobacco: Never   Tobacco comments:    hasn't smoked in 46 years;   Substance and Sexual Activity   Alcohol use: Yes    Alcohol/week: 0.0 standard drinks of alcohol    Comment: occasional;    Drug use: No   Sexual activity: Not on file  Other Topics Concern   Not on file  Social History Narrative   Not on file   Social Determinants of Health   Financial Resource Strain: Not on file  Food Insecurity: Not on file  Transportation Needs: Not on file  Physical Activity: Not on file  Stress: Not on file  Social Connections:  Not on file  Intimate Partner Violence: Not on file    FAMILY HISTORY: Family History  Problem Relation Age of Onset   Hypertension Mother    Cancer Father        prostate cancer   Prostate cancer Father    Hypertension Sister    Hypertension Brother    Colon cancer Neg Hx    Rectal cancer Neg Hx    Stomach cancer Neg Hx    Esophageal cancer Neg Hx    Pancreatic cancer Neg Hx     ALLERGIES:  has No Known Allergies.  MEDICATIONS:  Current Outpatient Medications  Medication Sig Dispense Refill   carvedilol (COREG) 12.5 MG tablet TAKE 1 TABLET (12.5 MG TOTAL) BY MOUTH 2 (TWO) TIMES DAILY WITH A MEAL. 60 tablet  5   Cholecalciferol 50 MCG (2000 UT) TABS 1 tab by mouth once daily 30 tablet 99   fluticasone (FLONASE) 50 MCG/ACT nasal spray Place 2 sprays into both nostrils daily. 16 g 6   furosemide (LASIX) 20 MG tablet TAKE 1 TABLET BY MOUTH DAILY AS NEEDED FOR EDEMA.PLEASE CALL TO SCHEDULE OVERDUE APPT WITH DR KELLEY 30 tablet 0   hydroxyurea (HYDREA) 500 MG capsule Take 2 capsules (1,000 mg total) by mouth daily. May take with food to minimize GI side effects. 120 capsule 3   lisinopril (ZESTRIL) 2.5 MG tablet TAKE 1 TABLET BY MOUTH EVERY DAY 90 tablet 3   lovastatin (MEVACOR) 40 MG tablet TAKE 1 TABLET BY MOUTH EVERY DAY 90 tablet 3   tadalafil (CIALIS) 20 MG tablet Take 0.5-1 tablets (10-20 mg total) by mouth every other day as needed for erectile dysfunction. 10 tablet 11   tamsulosin (FLOMAX) 0.4 MG CAPS capsule TAKE 1 CAPSULE BY MOUTH EVERY DAY 90 capsule 1   tiZANidine (ZANAFLEX) 2 MG tablet Take 1 tablet (2 mg total) by mouth every 6 (six) hours as needed for muscle spasms. 30 tablet 0   No current facility-administered medications for this visit.    REVIEW OF SYSTEMS:   Constitutional: ( - ) fevers, ( - )  chills , ( - ) night sweats Eyes: ( - ) blurriness of vision, ( - ) double vision, ( - ) watery eyes Ears, nose, mouth, throat, and face: ( - ) mucositis, ( - ) sore throat Respiratory: ( - ) cough, ( - ) dyspnea, ( - ) wheezes Cardiovascular: ( - ) palpitation, ( - ) chest discomfort, ( - ) lower extremity swelling Gastrointestinal:  ( - ) nausea, ( - ) heartburn, ( - ) change in bowel habits Skin: ( - ) abnormal skin rashes Lymphatics: ( - ) new lymphadenopathy, ( - ) easy bruising Neurological: ( - ) numbness, ( - ) tingling, ( - ) new weaknesses Behavioral/Psych: ( - ) mood change, ( - ) new changes  All other systems were reviewed with the patient and are negative.  PHYSICAL EXAMINATION: ECOG PERFORMANCE STATUS: 0 - Asymptomatic  Vitals:   11/29/22 1032  BP: (!) 145/87   Pulse: 77  Resp: 14  Temp: (!) 97.5 F (36.4 C)  SpO2: 100%   Filed Weights   11/29/22 1032  Weight: 168 lb 9.6 oz (76.5 kg)    GENERAL: well appearing male in NAD  SKIN: skin color, texture, turgor are normal, no rashes or significant lesions EYES: conjunctiva are pink and non-injected, sclera clear LUNGS: clear to auscultation and percussion with normal breathing effort HEART: regular rate & rhythm and no murmurs  and no lower extremity edema Musculoskeletal: no cyanosis of digits and no clubbing  PSYCH: alert & oriented x 3, fluent speech NEURO: no focal motor/sensory deficits  LABORATORY DATA:  I have reviewed the data as listed    Latest Ref Rng & Units 11/29/2022   10:20 AM 05/29/2022    2:39 PM 01/23/2022    8:58 AM  CBC  WBC 4.0 - 10.5 K/uL 10.3  14.5  14.0   Hemoglobin 13.0 - 17.0 g/dL 17.2  16.1  15.5   Hematocrit 39.0 - 52.0 % 53.8  50.6  47.5   Platelets 150 - 400 K/uL 505  622  544        Latest Ref Rng & Units 11/29/2022   10:20 AM 05/29/2022    2:39 PM 01/23/2022    8:58 AM  CMP  Glucose 70 - 99 mg/dL 80  89  114   BUN 8 - 23 mg/dL 18  17  16    Creatinine 0.61 - 1.24 mg/dL 1.23  1.12  1.26   Sodium 135 - 145 mmol/L 139  140  142   Potassium 3.5 - 5.1 mmol/L 4.7  4.2  4.0   Chloride 98 - 111 mmol/L 105  108  107   CO2 22 - 32 mmol/L 29  26  26    Calcium 8.9 - 10.3 mg/dL 9.8  9.7  9.7   Total Protein 6.5 - 8.1 g/dL 6.9  7.0  7.0   Total Bilirubin 0.3 - 1.2 mg/dL 1.1  1.0  0.7   Alkaline Phos 38 - 126 U/L 64  56  62   AST 15 - 41 U/L 16  23  17    ALT 0 - 44 U/L 11  21  14       PATHOLOGY: Collected Date: 11/20/2009   Received Date: 11/20/2009    FINAL DIAGNOSIS    Bone Marrow, Aspirate,Biopsy, and Clot, left iliac crest :   - HYPERCELLULAR MARROW WITH FEATURES OF CHRONIC MYELOPROLIFERATIVE DISORDER.   - SEE COMMENT.   PERIPHERAL BLOOD:   - THROMBOCYTOSIS.   ASSESSMENT & PLAN Dillon [name] is a 77 y.o. male who presents for a follow up for  JAK2 positive essential thrombocythemia.   [id number] Essential Thrombocythemia: --Currently on Hydrea 1000 mg PO daily --Labs today reviewed with patient. WBC 10.3, Hgb 17.2, MCV 98.9, Plt 505K. Creatinine and LFTs normal --Reviewed dose with Dr. Lorenso Courier who recommends to increase Hydrea dose from 500 mg in the morning and 1000 mg in the evening. New prescription was sent with new dose.  --RTC in 6 months with labs   No orders of the defined types were placed in this encounter.   All questions were answered. The patient knows to call the clinic with any problems, questions or concerns.  I have spent a total of 30 minutes minutes of face-to-face and non-face-to-face time, preparing to see the patient, performing a medically appropriate examination, counseling and educating the patient, ordering medications, documenting clinical information in the electronic health record, and care coordination.   Dede Query, PA-C Department of Hematology/Oncology [place] at [facility] Phone: [phone]

## 2022-12-30 ENCOUNTER — Encounter: Payer: Self-pay | Admitting: Oncology

## 2023-01-15 ENCOUNTER — Other Ambulatory Visit: Payer: Self-pay | Admitting: Internal Medicine

## 2023-01-30 ENCOUNTER — Ambulatory Visit: Payer: 59 | Admitting: Internal Medicine

## 2023-01-31 ENCOUNTER — Ambulatory Visit (INDEPENDENT_AMBULATORY_CARE_PROVIDER_SITE_OTHER): Payer: 59 | Admitting: Internal Medicine

## 2023-01-31 ENCOUNTER — Encounter: Payer: Self-pay | Admitting: Internal Medicine

## 2023-01-31 ENCOUNTER — Ambulatory Visit (INDEPENDENT_AMBULATORY_CARE_PROVIDER_SITE_OTHER): Payer: Medicare (Managed Care)

## 2023-01-31 VITALS — BP 124/80 | HR 80 | Temp 98.8°F | Ht 69.0 in | Wt 171.0 lb

## 2023-01-31 DIAGNOSIS — M25561 Pain in right knee: Secondary | ICD-10-CM

## 2023-01-31 DIAGNOSIS — G8929 Other chronic pain: Secondary | ICD-10-CM

## 2023-01-31 DIAGNOSIS — I10 Essential (primary) hypertension: Secondary | ICD-10-CM

## 2023-01-31 DIAGNOSIS — E7800 Pure hypercholesterolemia, unspecified: Secondary | ICD-10-CM | POA: Diagnosis not present

## 2023-01-31 DIAGNOSIS — N1831 Chronic kidney disease, stage 3a: Secondary | ICD-10-CM | POA: Diagnosis not present

## 2023-01-31 DIAGNOSIS — Z86010 Personal history of colonic polyps: Secondary | ICD-10-CM

## 2023-01-31 DIAGNOSIS — R7302 Impaired glucose tolerance (oral): Secondary | ICD-10-CM | POA: Diagnosis not present

## 2023-01-31 DIAGNOSIS — E559 Vitamin D deficiency, unspecified: Secondary | ICD-10-CM

## 2023-01-31 DIAGNOSIS — E538 Deficiency of other specified B group vitamins: Secondary | ICD-10-CM | POA: Diagnosis not present

## 2023-01-31 DIAGNOSIS — Z0001 Encounter for general adult medical examination with abnormal findings: Secondary | ICD-10-CM

## 2023-01-31 LAB — CBC WITH DIFFERENTIAL/PLATELET
Basophils Absolute: 0.1 10*3/uL (ref 0.0–0.1)
Basophils Relative: 0.9 % (ref 0.0–3.0)
Eosinophils Absolute: 0.1 10*3/uL (ref 0.0–0.7)
Eosinophils Relative: 1.5 % (ref 0.0–5.0)
HCT: 43.7 % (ref 39.0–52.0)
Hemoglobin: 13.7 g/dL (ref 13.0–17.0)
Lymphocytes Relative: 10.8 % — ABNORMAL LOW (ref 12.0–46.0)
Lymphs Abs: 1 10*3/uL (ref 0.7–4.0)
MCHC: 31.4 g/dL (ref 30.0–36.0)
MCV: 107.1 fl — ABNORMAL HIGH (ref 78.0–100.0)
Monocytes Absolute: 0.4 10*3/uL (ref 0.1–1.0)
Monocytes Relative: 4.5 % (ref 3.0–12.0)
Neutro Abs: 7.3 10*3/uL (ref 1.4–7.7)
Neutrophils Relative %: 82.3 % — ABNORMAL HIGH (ref 43.0–77.0)
Platelets: 554 10*3/uL — ABNORMAL HIGH (ref 150.0–400.0)
RBC: 4.08 Mil/uL — ABNORMAL LOW (ref 4.22–5.81)
RDW: 18.2 % — ABNORMAL HIGH (ref 11.5–15.5)
WBC: 8.9 10*3/uL (ref 4.0–10.5)

## 2023-01-31 LAB — HEPATIC FUNCTION PANEL
ALT: 17 U/L (ref 0–53)
AST: 16 U/L (ref 0–37)
Albumin: 4.3 g/dL (ref 3.5–5.2)
Alkaline Phosphatase: 70 U/L (ref 39–117)
Bilirubin, Direct: 0.2 mg/dL (ref 0.0–0.3)
Total Bilirubin: 0.7 mg/dL (ref 0.2–1.2)
Total Protein: 6.6 g/dL (ref 6.0–8.3)

## 2023-01-31 LAB — LIPID PANEL
Cholesterol: 115 mg/dL (ref 0–200)
HDL: 59.9 mg/dL (ref >39.00)
LDL Cholesterol: 35 mg/dL (ref 0–99)
NonHDL: 55.1
Total CHOL/HDL Ratio: 2
Triglycerides: 99 mg/dL (ref 0.0–149.0)
VLDL: 19.8 mg/dL (ref 0.0–40.0)

## 2023-01-31 LAB — URINALYSIS, ROUTINE W REFLEX MICROSCOPIC
Bilirubin Urine: NEGATIVE
Hgb urine dipstick: NEGATIVE
Leukocytes,Ua: NEGATIVE
Nitrite: NEGATIVE
Specific Gravity, Urine: 1.025 (ref 1.000–1.030)
Total Protein, Urine: NEGATIVE
Urine Glucose: NEGATIVE
Urobilinogen, UA: 2 — AB (ref 0.0–1.0)
pH: 6 (ref 5.0–8.0)

## 2023-01-31 LAB — BASIC METABOLIC PANEL
BUN: 14 mg/dL (ref 6–23)
CO2: 31 mEq/L (ref 19–32)
Calcium: 9.5 mg/dL (ref 8.4–10.5)
Chloride: 104 mEq/L (ref 96–112)
Creatinine, Ser: 1.26 mg/dL (ref 0.40–1.50)
GFR: 54.32 mL/min — ABNORMAL LOW (ref >60.00)
Glucose, Bld: 89 mg/dL (ref 70–99)
Potassium: 4.5 mEq/L (ref 3.5–5.1)
Sodium: 140 mEq/L (ref 135–145)

## 2023-01-31 LAB — HEMOGLOBIN A1C: Hgb A1c MFr Bld: 5.6 % (ref 4.6–6.5)

## 2023-01-31 LAB — VITAMIN B12: Vitamin B-12: 288 pg/mL (ref 211–911)

## 2023-01-31 LAB — TSH: TSH: 1.61 u[IU]/mL (ref 0.35–5.50)

## 2023-01-31 LAB — VITAMIN D 25 HYDROXY (VIT D DEFICIENCY, FRACTURES): VITD: 31.55 ng/mL (ref 30.00–100.00)

## 2023-01-31 MED ORDER — TADALAFIL 20 MG PO TABS: 10.0000 mg | ORAL_TABLET | ORAL | 11 refills | Status: AC | PRN

## 2023-01-31 NOTE — Progress Notes (Unsigned)
Patient ID: Dillon [name], male   DOB: 1946/05/15, 77 y.o.   MRN: [id number]         Chief Complaint:: wellness exam and Annual Exam (Having some right knee pain for the past 2 and 3 months )  , ED, hx of colon polyps, hld, hyperglycemia, ckd3a       HPI:  Dillon [name] is a 77 y.o. male here for wellness exam; for colonoscopy, o/w up to date                        Also Pt denies chest pain, increased sob or doe, wheezing, orthopnea, PND, increased LE swelling, palpitations, dizziness or syncope.   Pt denies polydipsia, polyuria, or new focal neuro s/s.    Pt denies fever, wt loss, night sweats, loss of appetite, or other constitutional symptoms  Has worsening ED symptoms in the past 5 mo, willing to take cialis 20 mg prn.  Also has mild to mod recurring 3 mo right knee pain and sweling, without giveaways or falls.     Wt Readings from Last 3 Encounters:  01/31/23 171 lb (77.6 kg)  11/29/22 168 lb 9.6 oz (76.5 kg)  05/29/22 172 lb 1.6 oz (78.1 kg)   BP Readings from Last 3 Encounters:  01/31/23 124/80  11/29/22 (!) 145/87  05/29/22 136/84   Immunization History  Administered Date(s) Administered   COVID-19, mRNA, vaccine(Comirnaty)12 years and older 10/04/2022   Fluad Quad(high Dose 65+) 06/19/2022   H1N1 11/15/2008   Influenza Split 07/28/2012   Influenza Whole 11/29/2009   Influenza, High Dose Seasonal PF 05/18/2015, 05/01/2017, 06/16/2018, 05/06/2019, 05/12/2020, 07/04/2021   Influenza, Seasonal, Injecte, Preservative Fre 06/16/2013   Influenza,inj,Quad PF,6+ Mos 05/06/2014   Influenza-Unspecified 05/12/2017, 05/08/2019   PFIZER Comirnaty(Gray Top)Covid-19 Tri-Sucrose Vaccine 01/10/2021   PFIZER(Purple Top)SARS-COV-2 Vaccination 10/16/2019, 11/05/2019, 06/18/2020, 07/02/2021   Pfizer Covid-19 Vaccine Bivalent Booster 13yrs & up 10/04/2021, 04/07/2022   Pneumococcal Conjugate-13 07/29/2013   Pneumococcal Polysaccharide-23 03/04/2007, 05/04/2015   Respiratory Syncytial  Virus Vaccine,Recomb Aduvanted(Arexvy) 06/19/2022   Td 11/15/2008   Tdap 05/14/2019   Zoster Recombinat (Shingrix) 05/12/2020, 07/13/2020   Zoster, Live 03/04/2007   Health Maintenance Due  Topic Date Due   Medicare Annual Wellness (AWV)  05/03/2016   COLONOSCOPY (Pts 45-33yrs Insurance coverage will need to be confirmed)  12/01/2022      Past Medical History:  Diagnosis Date   Arthritis    right shoulder   Bladder neck obstruction 04/18/2011   CKD (chronic kidney disease), stage III ([facility]) 09/12/2015   ERECTILE DYSFUNCTION 11/15/2008   Essential hypertension, benign 04/28/2013   FATIGUE 11/15/2008   GERD 11/15/2008   History of adenomatous polyp of colon 05/06/2014   2010   HYPERLIPIDEMIA 11/15/2008   Impaired glucose tolerance 04/18/2011   LEG PAIN, LEFT 11/05/2010   LVH (left ventricular hypertrophy) 04/27/2012   MIGRAINE, COMMON 08/06/2010   Nocturia 12/19/2008   Polycythemia 04/27/2012   Swelling of limb 11/05/2010   THROMBOCYTOSIS 12/19/2008   myleoproliferative disorder   Past Surgical History:  Procedure Laterality Date   APPENDECTOMY     CARDIAC CATHETERIZATION N/A 09/05/2015   Procedure: Left Heart Cath and Coronary Angiography;  Surgeon: Lennette Bihari, MD;  Location: [facility];  Service: Cardiovascular;  Laterality: N/A;   CIRCUMCISION     30+yrs ago   COLONOSCOPY     POLYPECTOMY     s/p esophageal dilation     TONSILLECTOMY  UPPER GASTROINTESTINAL ENDOSCOPY      reports that he quit smoking about 52 years ago. His smoking use included cigarettes. He has a 6.00 pack-year smoking history. He has never used smokeless tobacco. He reports current alcohol use. He reports that he does not use drugs. family history includes Cancer in his father; Hypertension in his brother, mother, and sister; Prostate cancer in his father. No Known Allergies Current Outpatient Medications on File Prior to Visit  Medication Sig Dispense Refill   carvedilol (COREG) 12.5 MG tablet TAKE 1  TABLET (12.5 MG TOTAL) BY MOUTH 2 (TWO) TIMES DAILY WITH A MEAL. 60 tablet 5   Cholecalciferol 50 MCG (2000 UT) TABS 1 tab by mouth once daily 30 tablet 99   fluticasone (FLONASE) 50 MCG/ACT nasal spray Place 2 sprays into both nostrils daily. 16 g 6   furosemide (LASIX) 20 MG tablet TAKE 1 TABLET BY MOUTH DAILY AS NEEDED FOR EDEMA.PLEASE CALL TO SCHEDULE OVERDUE APPT WITH DR KELLEY 30 tablet 0   hydroxyurea (HYDREA) 500 MG capsule Take 1 tablet (500 mg) by mouth in the morning and 2 tables (1000 mg) by mouth in the evening. May take with food to minimize GI side effects. 90 capsule 6   lisinopril (ZESTRIL) 2.5 MG tablet TAKE 1 TABLET BY MOUTH EVERY DAY 90 tablet 3   lovastatin (MEVACOR) 40 MG tablet TAKE 1 TABLET BY MOUTH EVERY DAY 90 tablet 3   tamsulosin (FLOMAX) 0.4 MG CAPS capsule TAKE 1 CAPSULE BY MOUTH EVERY DAY 90 capsule 1   tiZANidine (ZANAFLEX) 2 MG tablet Take 1 tablet (2 mg total) by mouth every 6 (six) hours as needed for muscle spasms. 30 tablet 0   No current facility-administered medications on file prior to visit.        ROS:  All others reviewed and negative.  Objective        PE:  BP 124/80 (BP Location: Right Arm, Patient Position: Sitting, Cuff Size: Normal)   Pulse 80   Temp 98.8 F (37.1 C) (Oral)   Ht 5\' 9"  (1.753 m)   Wt 171 lb (77.6 kg)   SpO2 100%   BMI 25.25 kg/m                 Constitutional: Pt appears in NAD               HENT: Head: NCAT.                Right Ear: External ear normal.                 Left Ear: External ear normal.                Eyes: . Pupils are equal, round, and reactive to light. Conjunctivae and EOM are normal               Nose: without d/c or deformity               Neck: Neck supple. Gross normal ROM               Cardiovascular: Normal rate and regular rhythm.                 Pulmonary/Chest: Effort normal and breath sounds without rales or wheezing.                Abd:  Soft, NT, ND, + BS, no organomegaly  Neurological: Pt is alert. At baseline orientation, motor grossly intact               Skin: Skin is warm. No rashes, no other new lesions, LE edema - none; right knee with diffue mild heat and swelling               Psychiatric: Pt behavior is normal without agitation   Micro: none  Cardiac tracings I have personally interpreted today:  none  Pertinent Radiological findings (summarize): none   Lab Results  Component Value Date   WBC 8.9 01/31/2023   HGB 13.7 01/31/2023   HCT 43.7 01/31/2023   PLT 554.0 (H) 01/31/2023   GLUCOSE 89 01/31/2023   CHOL 115 01/31/2023   TRIG 99.0 01/31/2023   HDL 59.90 01/31/2023   LDLDIRECT 57.0 12/15/2017   LDLCALC 35 01/31/2023   ALT 17 01/31/2023   AST 16 01/31/2023   NA 140 01/31/2023   K 4.5 01/31/2023   CL 104 01/31/2023   CREATININE 1.26 01/31/2023   BUN 14 01/31/2023   CO2 31 01/31/2023   TSH 1.61 01/31/2023   PSA 2.31 01/24/2022   INR 1.32 09/04/2015   HGBA1C 5.6 01/31/2023   Assessment/Plan:  Dillon [name] is a 77 y.o. Black or African American [2] male with  has a past medical history of Arthritis, Bladder neck obstruction (04/18/2011), CKD (chronic kidney disease), stage III ([facility]) (09/12/2015), ERECTILE DYSFUNCTION (11/15/2008), Essential hypertension, benign (04/28/2013), FATIGUE (11/15/2008), GERD (11/15/2008), History of adenomatous polyp of colon (05/06/2014), HYPERLIPIDEMIA (11/15/2008), Impaired glucose tolerance (04/18/2011), LEG PAIN, LEFT (11/05/2010), LVH (left ventricular hypertrophy) (04/27/2012), MIGRAINE, COMMON (08/06/2010), Nocturia (12/19/2008), Polycythemia (04/27/2012), Swelling of limb (11/05/2010), and THROMBOCYTOSIS (12/19/2008).  Encounter for well adult exam with abnormal findings Age and sex appropriate education and counseling updated with regular exercise and diet Referrals for preventative services - for colonoscopy Immunizations addressed - none needed Smoking counseling  - none needed Evidence for depression or other  mood disorder - none significant Most recent labs reviewed. I have personally reviewed and have noted: 1) the patient's medical and social history 2) The patient's current medications and supplements 3) The patient's height, weight, and BMI have been recorded in the chart   Hyperlipidemia Lab Results  Component Value Date   LDLCALC 35 01/31/2023   Stable, pt to continue current statin lovastatin 40 mg qd   Impaired glucose tolerance Lab Results  Component Value Date   HGBA1C 5.6 01/31/2023   Stable, pt to continue current medical treatment  - diet,wt control   Essential hypertension, benign BP Readings from Last 3 Encounters:  01/31/23 124/80  11/29/22 (!) 145/87  05/29/22 136/84   Stable, pt to continue medical treatment coreg 12.5 bid, lisinopril 2.5 qd   CKD (chronic kidney disease), stage III Lab Results  Component Value Date   CREATININE 1.26 01/31/2023   Stable overall, cont to avoid nephrotoxins   Vitamin D deficiency Last vitamin D Lab Results  Component Value Date   VD25OH 31.55 01/31/2023   Low, to start oral replacement   Right knee pain C/w DJD with recurring swelling and pain - for sport med referral  Followup: Return in about 6 months (around 08/03/2023).  Oliver Barre, MD 02/02/2023 8:17 PM [facility]

## 2023-01-31 NOTE — Patient Instructions (Signed)
Please continue all other medications as before, and refills have been done if requested.  Please have the pharmacy call with any other refills you may need.  Please continue your efforts at being more active, low cholesterol diet, and weight control.  You are otherwise up to date with prevention measures today.  Please keep your appointments with your specialists as you may have planned - oncology  You will be contacted regarding the referral for: Sports Medicine, and colonoscopy  Please go to the XRAY Department in the first floor for the x-ray testing  Please go to the LAB at the blood drawing area for the tests to be done  You will be contacted by phone if any changes need to be made immediately.  Otherwise, you will receive a letter about your results with an explanation, but please check with MyChart first.  Please remember to sign up for MyChart if you have not done so, as this will be important to you in the future with finding out test results, communicating by private email, and scheduling acute appointments online when needed.  Please make an Appointment to return in 6 months, or sooner if needed

## 2023-02-02 ENCOUNTER — Other Ambulatory Visit: Payer: Self-pay | Admitting: Internal Medicine

## 2023-02-02 ENCOUNTER — Encounter: Payer: Self-pay | Admitting: Internal Medicine

## 2023-02-02 NOTE — Assessment & Plan Note (Signed)
Lab Results  Component Value Date   LDLCALC 35 01/31/2023   Stable, pt to continue current statin lovastatin 40 mg qd

## 2023-02-02 NOTE — Assessment & Plan Note (Signed)
C/w DJD with recurring swelling and pain - for sport med referral

## 2023-02-02 NOTE — Assessment & Plan Note (Signed)
Lab Results  Component Value Date   HGBA1C 5.6 01/31/2023   Stable, pt to continue current medical treatment  - diet,wt control

## 2023-02-02 NOTE — Assessment & Plan Note (Signed)
BP Readings from Last 3 Encounters:  01/31/23 124/80  11/29/22 (!) 145/87  05/29/22 136/84   Stable, pt to continue medical treatment coreg 12.5 bid, lisinopril 2.5 qd

## 2023-02-02 NOTE — Assessment & Plan Note (Signed)
Lab Results  Component Value Date   CREATININE 1.26 01/31/2023   Stable overall, cont to avoid nephrotoxins

## 2023-02-02 NOTE — Assessment & Plan Note (Signed)
Last vitamin D Lab Results  Component Value Date   VD25OH 31.55 01/31/2023   Low, to start oral replacement

## 2023-02-02 NOTE — Assessment & Plan Note (Signed)

## 2023-02-03 ENCOUNTER — Other Ambulatory Visit: Payer: Self-pay

## 2023-02-06 ENCOUNTER — Encounter: Payer: Self-pay | Admitting: Internal Medicine

## 2023-06-02 ENCOUNTER — Inpatient Hospital Stay: Payer: Medicare (Managed Care) | Attending: Internal Medicine

## 2023-06-02 ENCOUNTER — Inpatient Hospital Stay: Payer: Medicare (Managed Care) | Admitting: Hematology and Oncology

## 2023-06-02 ENCOUNTER — Other Ambulatory Visit: Payer: Self-pay | Admitting: Hematology and Oncology

## 2023-06-02 DIAGNOSIS — D473 Essential (hemorrhagic) thrombocythemia: Secondary | ICD-10-CM

## 2023-06-02 NOTE — Progress Notes (Unsigned)
No show

## 2023-06-03 ENCOUNTER — Encounter: Payer: Self-pay | Admitting: Oncology

## 2023-06-04 ENCOUNTER — Telehealth: Payer: Self-pay | Admitting: Hematology and Oncology

## 2023-06-17 ENCOUNTER — Inpatient Hospital Stay: Payer: Medicare (Managed Care) | Attending: Physician Assistant

## 2023-06-17 ENCOUNTER — Inpatient Hospital Stay (HOSPITAL_BASED_OUTPATIENT_CLINIC_OR_DEPARTMENT_OTHER): Payer: Medicare (Managed Care) | Admitting: Physician Assistant

## 2023-06-17 ENCOUNTER — Inpatient Hospital Stay: Payer: Medicare (Managed Care)

## 2023-06-17 VITALS — BP 169/94 | HR 72 | Temp 98.4°F | Resp 15 | Wt 162.4 lb

## 2023-06-17 DIAGNOSIS — D473 Essential (hemorrhagic) thrombocythemia: Secondary | ICD-10-CM | POA: Insufficient documentation

## 2023-06-17 DIAGNOSIS — Z87891 Personal history of nicotine dependence: Secondary | ICD-10-CM | POA: Insufficient documentation

## 2023-06-17 DIAGNOSIS — D649 Anemia, unspecified: Secondary | ICD-10-CM

## 2023-06-17 DIAGNOSIS — Z8042 Family history of malignant neoplasm of prostate: Secondary | ICD-10-CM | POA: Insufficient documentation

## 2023-06-17 LAB — CBC WITH DIFFERENTIAL (CANCER CENTER ONLY)
Abs Immature Granulocytes: 0.07 10*3/uL (ref 0.00–0.07)
Basophils Absolute: 0.1 10*3/uL (ref 0.0–0.1)
Basophils Relative: 1 %
Eosinophils Absolute: 0.2 10*3/uL (ref 0.0–0.5)
Eosinophils Relative: 2 %
HCT: 37.1 % — ABNORMAL LOW (ref 39.0–52.0)
Hemoglobin: 11.7 g/dL — ABNORMAL LOW (ref 13.0–17.0)
Immature Granulocytes: 1 %
Lymphocytes Relative: 13 %
Lymphs Abs: 1.4 10*3/uL (ref 0.7–4.0)
MCH: 31.6 pg (ref 26.0–34.0)
MCHC: 31.5 g/dL (ref 30.0–36.0)
MCV: 100.3 fL — ABNORMAL HIGH (ref 80.0–100.0)
Monocytes Absolute: 0.8 10*3/uL (ref 0.1–1.0)
Monocytes Relative: 7 %
Neutro Abs: 8.8 10*3/uL — ABNORMAL HIGH (ref 1.7–7.7)
Neutrophils Relative %: 76 %
Platelet Count: 421 10*3/uL — ABNORMAL HIGH (ref 150–400)
RBC: 3.7 MIL/uL — ABNORMAL LOW (ref 4.22–5.81)
RDW: 13.9 % (ref 11.5–15.5)
WBC Count: 11.4 10*3/uL — ABNORMAL HIGH (ref 4.0–10.5)
nRBC: 0 % (ref 0.0–0.2)

## 2023-06-17 LAB — CMP (CANCER CENTER ONLY)
ALT: 10 U/L (ref 0–44)
AST: 14 U/L — ABNORMAL LOW (ref 15–41)
Albumin: 4.3 g/dL (ref 3.5–5.0)
Alkaline Phosphatase: 50 U/L (ref 38–126)
Anion gap: 6 (ref 5–15)
BUN: 15 mg/dL (ref 8–23)
CO2: 26 mmol/L (ref 22–32)
Calcium: 9.5 mg/dL (ref 8.9–10.3)
Chloride: 107 mmol/L (ref 98–111)
Creatinine: 1.2 mg/dL (ref 0.61–1.24)
GFR, Estimated: >60 mL/min (ref >60)
Glucose, Bld: 100 mg/dL — ABNORMAL HIGH (ref 70–99)
Potassium: 4.2 mmol/L (ref 3.5–5.1)
Sodium: 139 mmol/L (ref 135–145)
Total Bilirubin: 0.5 mg/dL (ref 0.3–1.2)
Total Protein: 6.6 g/dL (ref 6.5–8.1)

## 2023-06-17 LAB — IRON AND IRON BINDING CAPACITY (CC-WL,HP ONLY)
Iron: 42 ug/dL — ABNORMAL LOW (ref 45–182)
Saturation Ratios: 12 % — ABNORMAL LOW (ref 17.9–39.5)
TIBC: 351 ug/dL (ref 250–450)
UIBC: 309 ug/dL (ref 117–376)

## 2023-06-17 LAB — FOLATE: Folate: 11 ng/mL (ref >5.9)

## 2023-06-17 LAB — VITAMIN B12: Vitamin B-12: 223 pg/mL (ref 180–914)

## 2023-06-17 NOTE — Progress Notes (Signed)
[facility] Telephone:(336) [phone]   Fax:([phone]  PROGRESS NOTE  Patient Care Team: Corwin Levins, MD as PCP - General Louis Meckel, MD (Inactive) as Consulting Physician (Gastroenterology)  CHIEF COMPLAINTS/PURPOSE OF CONSULTATION:  JAK2 positive essential thrombocythemia.   CURRENT TREATMENT: Hydrea  500 mg in the AM and 1000 mg in the PM   HISTORY OF PRESENTING ILLNESS:  Dillon [name] 77 y.o. male returns for a follow up for JAK2 positive essential thrombocythemia. He was last seen by on 11/29/2022. He is unaccompanied for this visit.   On exam today, Dillon [name] reports he is tolerating Hydrea therapy without any toxicities.  His energy levels are stable. He does have some fatigue but contributes that to his age. He is able to complete all his daily activities on his own. He denies nausea, vomiting or bowel habit changes. He denies easy bruising or signs of active bleeding. He denies fevers, chills, sweats, shortness of breath, chest pain or cough. He has no other complaints. Rest of the 10 point ROS is below.   MEDICAL HISTORY:  Past Medical History:  Diagnosis Date   Arthritis    right shoulder   Bladder neck obstruction 04/18/2011   CKD (chronic kidney disease), stage III (HCC) 09/12/2015   ERECTILE DYSFUNCTION 11/15/2008   Essential hypertension, benign 04/28/2013   FATIGUE 11/15/2008   GERD 11/15/2008   History of adenomatous polyp of colon 05/06/2014   2010   HYPERLIPIDEMIA 11/15/2008   Impaired glucose tolerance 04/18/2011   LEG PAIN, LEFT 11/05/2010   LVH (left ventricular hypertrophy) 04/27/2012   MIGRAINE, COMMON 08/06/2010   Nocturia 12/19/2008   Polycythemia 04/27/2012   Swelling of limb 11/05/2010   THROMBOCYTOSIS 12/19/2008   myleoproliferative disorder    SURGICAL HISTORY: Past Surgical History:  Procedure Laterality Date   APPENDECTOMY     CARDIAC CATHETERIZATION N/A 09/05/2015   Procedure: Left Heart Cath and Coronary Angiography;   Surgeon: Lennette Bihari, MD;  Location[facility];  Service: Cardiovascular;  Laterality: N/A;   CIRCUMCISION     30+yrs ago   COLONOSCOPY     POLYPECTOMY     s/p esophageal dilation     TONSILLECTOMY     UPPER GASTROINTESTINAL ENDOSCOPY      SOCIAL HISTORY: Social History   Socioeconomic History   Marital status: Married    Spouse name: Not on file   Number of children: Not on file   Years of education: Not on file   Highest education level: Not on file  Occupational History   Occupation: Scientist, product/process development  Tobacco Use   Smoking status: Former    Current packs/day: 0.00    Average packs/day: 0.5 packs/day for 12.0 years (6.0 ttl pk-yrs)    Types: Cigarettes    Start date: 09/16/1958    Quit date: 09/16/1970    Years since quitting: 52.7   Smokeless tobacco: Never   Tobacco comments:    hasn't smoked in 46 years;   Substance and Sexual Activity   Alcohol use: Yes    Alcohol/week: 0.0 standard drinks of alcohol    Comment: occasional;    Drug use: No   Sexual activity: Not on file  Other Topics Concern   Not on file  Social History Narrative   Not on file   Social Determinants of Health   Financial Resource Strain: Not on file  Food Insecurity: Not on file  Transportation Needs: Not on file  Physical Activity: Not on  file  Stress: Not on file  Social Connections: Unknown (01/29/2022)   Received from [facility], [facility]   Social Network    Social Network: Not on file  Intimate Partner Violence: Unknown (12/21/2021)   Received from [facility], [facility]   HITS    Physically Hurt: Not on file    Insult or Talk Down To: Not on file    Threaten Physical Harm: Not on file    Scream or Curse: Not on file    FAMILY HISTORY: Family History  Problem Relation Age of Onset   Hypertension Mother    Cancer Father        prostate cancer   Prostate cancer Father    Hypertension Sister    Hypertension Brother    Colon cancer Neg Hx    Rectal  cancer Neg Hx    Stomach cancer Neg Hx    Esophageal cancer Neg Hx    Pancreatic cancer Neg Hx     ALLERGIES:  has No Known Allergies.  MEDICATIONS:  Current Outpatient Medications  Medication Sig Dispense Refill   carvedilol (COREG) 12.5 MG tablet TAKE 1 TABLET (12.5 MG TOTAL) BY MOUTH 2 (TWO) TIMES DAILY WITH A MEAL. 60 tablet 5   Cholecalciferol 50 MCG (2000 UT) TABS 1 tab by mouth once daily 30 tablet 99   fluticasone (FLONASE) 50 MCG/ACT nasal spray Place 2 sprays into both nostrils daily. 16 g 6   furosemide (LASIX) 20 MG tablet TAKE 1 TABLET BY MOUTH DAILY AS NEEDED FOR EDEMA.PLEASE CALL TO SCHEDULE OVERDUE APPT WITH DR KELLEY 30 tablet 0   hydroxyurea (HYDREA) 500 MG capsule Take 1 tablet (500 mg) by mouth in the morning and 2 tables (1000 mg) by mouth in the evening. May take with food to minimize GI side effects. 90 capsule 6   lisinopril (ZESTRIL) 2.5 MG tablet TAKE 1 TABLET BY MOUTH EVERY DAY 90 tablet 3   lovastatin (MEVACOR) 40 MG tablet TAKE 1 TABLET BY MOUTH EVERY DAY 90 tablet 3   tadalafil (CIALIS) 20 MG tablet Take 0.5-1 tablets (10-20 mg total) by mouth every other day as needed for erectile dysfunction. 10 tablet 11   tamsulosin (FLOMAX) 0.4 MG CAPS capsule TAKE 1 CAPSULE BY MOUTH EVERY DAY 90 capsule 1   tiZANidine (ZANAFLEX) 2 MG tablet Take 1 tablet (2 mg total) by mouth every 6 (six) hours as needed for muscle spasms. 30 tablet 0   No current facility-administered medications for this visit.    REVIEW OF SYSTEMS:   Constitutional: ( - ) fevers, ( - )  chills , ( - ) night sweats Eyes: ( - ) blurriness of vision, ( - ) double vision, ( - ) watery eyes Ears, nose, mouth, throat, and face: ( - ) mucositis, ( - ) sore throat Respiratory: ( - ) cough, ( - ) dyspnea, ( - ) wheezes Cardiovascular: ( - ) palpitation, ( - ) chest discomfort, ( - ) lower extremity swelling Gastrointestinal:  ( - ) nausea, ( - ) heartburn, ( - ) change in bowel habits Skin: ( - )  abnormal skin rashes Lymphatics: ( - ) new lymphadenopathy, ( - ) easy bruising Neurological: ( - ) numbness, ( - ) tingling, ( - ) new weaknesses Behavioral/Psych: ( - ) mood change, ( - ) new changes  All other systems were reviewed with the patient and are negative.  PHYSICAL EXAMINATION: ECOG PERFORMANCE STATUS: 0 - Asymptomatic  Vitals:  06/17/23 1456  BP: (!) 169/94  Pulse: 72  Resp: 15  Temp: 98.4 F (36.9 C)  SpO2: 100%   Filed Weights   06/17/23 1456  Weight: 162 lb 6.4 oz (73.7 kg)    GENERAL: well appearing male in NAD  SKIN: skin color, texture, turgor are normal, no rashes or significant lesions EYES: conjunctiva are pink and non-injected, sclera clear LUNGS: clear to auscultation and percussion with normal breathing effort HEART: regular rate & rhythm and no murmurs and no lower extremity edema Musculoskeletal: no cyanosis of digits and no clubbing  PSYCH: alert & oriented x 3, fluent speech NEURO: no focal motor/sensory deficits  LABORATORY DATA:  I have reviewed the data as listed    Latest Ref Rng & Units 06/17/2023    2:34 PM 01/31/2023    4:06 PM 11/29/2022   10:20 AM  CBC  WBC 4.0 - 10.5 K/uL 11.4  8.9  10.3   Hemoglobin 13.0 - 17.0 g/dL 40.9  81.1  91.4   Hematocrit 39.0 - 52.0 % 37.1  43.7  53.8   Platelets 150 - 400 K/uL 421  554.0  505        Latest Ref Rng & Units 06/17/2023    2:34 PM 01/31/2023    4:06 PM 11/29/2022   10:20 AM  CMP  Glucose 70 - 99 mg/dL 782  89  80   BUN 8 - 23 mg/dL 15  14  18    Creatinine 0.61 - 1.24 mg/dL 9.56  2.13  0.86   Sodium 135 - 145 mmol/L 139  140  139   Potassium 3.5 - 5.1 mmol/L 4.2  4.5  4.7   Chloride 98 - 111 mmol/L 107  104  105   CO2 22 - 32 mmol/L 26  31  29    Calcium 8.9 - 10.3 mg/dL 9.5  9.5  9.8   Total Protein 6.5 - 8.1 g/dL 6.6  6.6  6.9   Total Bilirubin 0.3 - 1.2 mg/dL 0.5  0.7  1.1   Alkaline Phos 38 - 126 U/L 50  70  64   AST 15 - 41 U/L 14  16  16    ALT 0 - 44 U/L 10  17  11        PATHOLOGY: Collected Date: 11/20/2009   Received Date: 11/20/2009    FINAL DIAGNOSIS    Bone Marrow, Aspirate,Biopsy, and Clot, left iliac crest :   - HYPERCELLULAR MARROW WITH FEATURES OF CHRONIC MYELOPROLIFERATIVE DISORDER.   - SEE COMMENT.   PERIPHERAL BLOOD:   - THROMBOCYTOSIS.   ASSESSMENT & PLAN Dillon [name] is a 77 y.o. male who presents for a follow up for JAK2 positive essential thrombocythemia.   #VHQ4 Essential Thrombocythemia: --Currently on Hydrea 1000 mg PO daily --Labs today reviewed with patient. WBC 11.4, Hgb 11.7, MCV 100.3, Plt 421K. Creatinine and LFTs in range.  --Currently taking Hydrea 500 mg in the morning and 1000 mg in the evening daily. Reviewed dose with Dr. Leonides Schanz who recommends keep Hydrea dose the same.  --Recommend to check for iron, B12 and folate levels to further evaluate new onset anemia. --RTC in 6 weeks for lab check and 12 weeks for labs/follow up.   Orders Placed This Encounter  Procedures   Vitamin B12    Standing Status:   Future    Standing Expiration Date:   06/16/2024   Methylmalonic acid, serum    Standing Status:   Future    Standing Expiration  Date:   06/16/2024   Folate, Serum    Standing Status:   Future    Standing Expiration Date:   06/16/2024   Iron and Iron Binding Capacity ([facility] only)    Standing Status:   Future    Standing Expiration Date:   06/16/2024   Ferritin    Standing Status:   Future    Standing Expiration Date:   06/16/2024    All questions were answered. The patient knows to call the clinic with any problems, questions or concerns.  I have spent a total of 30 minutes minutes of face-to-face and non-face-to-face time, preparing to see the patient, performing a medically appropriate examination, counseling and educating the patient, ordering medications, documenting clinical information in the electronic health record, and care coordination.   Dillon Kaufmann, PA-C Department of  Hematology/Oncology [facility] at [facility] Phone: [phone]

## 2023-06-18 LAB — FERRITIN: Ferritin: 18 ng/mL — ABNORMAL LOW (ref 24–336)

## 2023-06-20 LAB — METHYLMALONIC ACID, SERUM: Methylmalonic Acid, Quantitative: 326 nmol/L (ref 0–378)

## 2023-06-23 ENCOUNTER — Other Ambulatory Visit: Payer: Self-pay | Admitting: Physician Assistant

## 2023-06-23 DIAGNOSIS — D509 Iron deficiency anemia, unspecified: Secondary | ICD-10-CM

## 2023-06-23 MED ORDER — FERROUS SULFATE 325 (65 FE) MG PO TBEC: 325.0000 mg | DELAYED_RELEASE_TABLET | Freq: Every day | ORAL | 3 refills | Status: DC

## 2023-06-23 NOTE — Progress Notes (Signed)
Reviewed labs from 06/17/2023 with Dr. Leonides Schanz. Evidence of iron deficiency is noted but due to history of polycythemia in the past, it is recommended to hold iron supplementation at this time.

## 2023-07-02 ENCOUNTER — Telehealth: Payer: Self-pay | Admitting: Internal Medicine

## 2023-07-02 NOTE — Telephone Encounter (Signed)
Pt has dropped off a clinical data sheet to be filled out and it has been placed in the providers box.   Please call pt when form is ready for pick up: [phone]

## 2023-07-04 NOTE — Telephone Encounter (Signed)
Placed on providers desk

## 2023-07-08 NOTE — Telephone Encounter (Signed)
Patient has picked up form

## 2023-07-08 NOTE — Telephone Encounter (Signed)
Called and left voicemail, letting Pt know form is ready for pickup and has been placed up front.

## 2023-07-29 ENCOUNTER — Other Ambulatory Visit: Payer: Self-pay | Admitting: Hematology and Oncology

## 2023-07-29 ENCOUNTER — Inpatient Hospital Stay: Payer: Medicare (Managed Care) | Attending: Physician Assistant

## 2023-07-29 DIAGNOSIS — D509 Iron deficiency anemia, unspecified: Secondary | ICD-10-CM

## 2023-08-11 ENCOUNTER — Encounter: Payer: Self-pay | Admitting: Family Medicine

## 2023-08-11 ENCOUNTER — Ambulatory Visit (INDEPENDENT_AMBULATORY_CARE_PROVIDER_SITE_OTHER): Payer: 59 | Admitting: Family Medicine

## 2023-08-11 VITALS — BP 138/62 | HR 70 | Temp 98.5°F | Resp 20 | Ht 69.0 in | Wt 168.0 lb

## 2023-08-11 DIAGNOSIS — G8929 Other chronic pain: Secondary | ICD-10-CM | POA: Diagnosis not present

## 2023-08-11 DIAGNOSIS — M779 Enthesopathy, unspecified: Secondary | ICD-10-CM | POA: Diagnosis not present

## 2023-08-11 DIAGNOSIS — M25561 Pain in right knee: Secondary | ICD-10-CM

## 2023-08-11 MED ORDER — PREDNISONE 10 MG (21) PO TBPK
ORAL_TABLET | ORAL | 0 refills | Status: DC
Start: 2023-08-11 — End: 2024-05-28

## 2023-08-11 NOTE — Progress Notes (Signed)
Assessment & Plan:  1. Tendonitis Ankle exercises provided for patient to complete at home. - predniSONE (STERAPRED UNI-PAK 21 TAB) 10 MG (21) TBPK tablet; As directed x 6 days  Dispense: 21 tablet; Refill: 0  2. Chronic pain of right knee  Patient never heard from his referral to the sports medicine practice.  Name and phone number provided to him today so that he can call and schedule his appointment.   Follow up plan: Return if symptoms worsen or fail to improve.  Deliah Boston, MSN, APRN, FNP-C  Subjective:  HPI: Dillon [name] is a 77 y.o. male presenting on 08/11/2023 for right knee pain (Started about 6 mos ago. Worse some times than others ) and left ankle pain (Started Saturday night - no known injury or fall)  Patient reports left ankle pain that started two days ago.  Denies any injury or fall.  He has hide chronic right knee pain for the past 6 months.  He was previously referred to sports medicine.  The right knee x-ray in May 2024 showed mild arthritis.   ROS: Negative unless specifically indicated above in HPI.   Relevant past medical history reviewed and updated as indicated.   Allergies and medications reviewed and updated.   Current Outpatient Medications:    carvedilol (COREG) 12.5 MG tablet, TAKE 1 TABLET (12.5 MG TOTAL) BY MOUTH 2 (TWO) TIMES DAILY WITH A MEAL., Disp: 60 tablet, Rfl: 5   hydroxyurea (HYDREA) 500 MG capsule, Take 1 tablet (500 mg) by mouth in the morning and 2 tables (1000 mg) by mouth in the evening. May take with food to minimize GI side effects., Disp: 90 capsule, Rfl: 6   lisinopril (ZESTRIL) 2.5 MG tablet, TAKE 1 TABLET BY MOUTH EVERY DAY, Disp: 90 tablet, Rfl: 3   lovastatin (MEVACOR) 40 MG tablet, TAKE 1 TABLET BY MOUTH EVERY DAY, Disp: 90 tablet, Rfl: 3   tadalafil (CIALIS) 20 MG tablet, Take 0.5-1 tablets (10-20 mg total) by mouth every other day as needed for erectile dysfunction., Disp: 10 tablet, Rfl: 11   tamsulosin (FLOMAX)  0.4 MG CAPS capsule, TAKE 1 CAPSULE BY MOUTH EVERY DAY, Disp: 90 capsule, Rfl: 1   Cholecalciferol 50 MCG (2000 UT) TABS, 1 tab by mouth once daily (Patient not taking: Reported on 08/11/2023), Disp: 30 tablet, Rfl: 99   fluticasone (FLONASE) 50 MCG/ACT nasal spray, Place 2 sprays into both nostrils daily. (Patient not taking: Reported on 08/11/2023), Disp: 16 g, Rfl: 6   furosemide (LASIX) 20 MG tablet, TAKE 1 TABLET BY MOUTH DAILY AS NEEDED FOR EDEMA.PLEASE CALL TO SCHEDULE OVERDUE APPT WITH DR Nicholaus Bloom, Disp: 30 tablet, Rfl: 0   tiZANidine (ZANAFLEX) 2 MG tablet, Take 1 tablet (2 mg total) by mouth every 6 (six) hours as needed for muscle spasms. (Patient not taking: Reported on 08/11/2023), Disp: 30 tablet, Rfl: 0  No Known Allergies  Objective:   BP 138/62   Pulse 70   Temp 98.5 F (36.9 C)   Resp 20   Ht 5\' 9"  (1.753 m)   Wt 168 lb (76.2 kg)   BMI 24.81 kg/m    Physical Exam Vitals reviewed.  Constitutional:      General: He is not in acute distress.    Appearance: Normal appearance. He is not ill-appearing, toxic-appearing or diaphoretic.  HENT:     Head: Normocephalic and atraumatic.  Eyes:     General: No scleral icterus.       Right eye: No discharge.  Left eye: No discharge.     Conjunctiva/sclera: Conjunctivae normal.  Cardiovascular:     Rate and Rhythm: Normal rate.  Pulmonary:     Effort: Pulmonary effort is normal. No respiratory distress.  Musculoskeletal:        General: Normal range of motion.     Cervical back: Normal range of motion.     Left ankle: No swelling, deformity, ecchymosis or lacerations. Tenderness (with extension and flexion) present. Normal range of motion.  Skin:    General: Skin is warm and dry.  Neurological:     Mental Status: He is alert and oriented to person, place, and time. Mental status is at baseline.  Psychiatric:        Mood and Affect: Mood normal.        Behavior: Behavior normal.        Thought Content: Thought  content normal.        Judgment: Judgment normal.

## 2023-08-11 NOTE — Patient Instructions (Addendum)
[facility] at [facility] [phone]

## 2023-08-14 ENCOUNTER — Other Ambulatory Visit: Payer: Self-pay | Admitting: Internal Medicine

## 2023-08-18 ENCOUNTER — Other Ambulatory Visit: Payer: Self-pay

## 2023-09-08 ENCOUNTER — Inpatient Hospital Stay (HOSPITAL_BASED_OUTPATIENT_CLINIC_OR_DEPARTMENT_OTHER): Payer: Medicare (Managed Care) | Admitting: Hematology and Oncology

## 2023-09-08 ENCOUNTER — Inpatient Hospital Stay: Payer: Medicare (Managed Care) | Attending: Physician Assistant

## 2023-09-08 DIAGNOSIS — D473 Essential (hemorrhagic) thrombocythemia: Secondary | ICD-10-CM

## 2023-09-08 NOTE — Progress Notes (Unsigned)
[facility] Telephone:([phone]   Fax:(336) [phone]  PROGRESS NOTE  Patient Care Team: Corwin Levins, MD as PCP - General Louis Meckel, MD (Inactive) as Consulting Physician (Gastroenterology)  CHIEF COMPLAINTS/PURPOSE OF CONSULTATION:  JAK2 positive essential thrombocythemia.   CURRENT TREATMENT: Hydrea  500 mg in the AM and 1000 mg in the PM   HISTORY OF PRESENTING ILLNESS:  Dillon [name] 77 y.o. male returns for a follow up for JAK2 positive essential thrombocythemia. He was last seen by on 11/29/2022. He is unaccompanied for this visit.   On exam today, Dillon [name] reports he is tolerating Hydrea therapy without any toxicities.  His energy levels are stable. He does have some fatigue but contributes that to his age. He is able to complete all his daily activities on his own. He denies nausea, vomiting or bowel habit changes. He denies easy bruising or signs of active bleeding. He denies fevers, chills, sweats, shortness of breath, chest pain or cough. He has no other complaints. Rest of the 10 point ROS is below.   MEDICAL HISTORY:  Past Medical History:  Diagnosis Date   Arthritis    right shoulder   Bladder neck obstruction 04/18/2011   CKD (chronic kidney disease), stage III (HCC) 09/12/2015   ERECTILE DYSFUNCTION 11/15/2008   Essential hypertension, benign 04/28/2013   FATIGUE 11/15/2008   GERD 11/15/2008   History of adenomatous polyp of colon 05/06/2014   2010   HYPERLIPIDEMIA 11/15/2008   Impaired glucose tolerance 04/18/2011   LEG PAIN, LEFT 11/05/2010   LVH (left ventricular hypertrophy) 04/27/2012   MIGRAINE, COMMON 08/06/2010   Nocturia 12/19/2008   Polycythemia 04/27/2012   Swelling of limb 11/05/2010   THROMBOCYTOSIS 12/19/2008   myleoproliferative disorder    SURGICAL HISTORY: Past Surgical History:  Procedure Laterality Date   APPENDECTOMY     CARDIAC CATHETERIZATION N/A 09/05/2015   Procedure: Left Heart Cath and Coronary Angiography;   Surgeon: Lennette Bihari, MD;  Location: [facility];  Service: Cardiovascular;  Laterality: N/A;   CIRCUMCISION     30+yrs ago   COLONOSCOPY     POLYPECTOMY     s/p esophageal dilation     TONSILLECTOMY     UPPER GASTROINTESTINAL ENDOSCOPY      SOCIAL HISTORY: Social History   Socioeconomic History   Marital status: Married    Spouse name: Not on file   Number of children: Not on file   Years of education: Not on file   Highest education level: Not on file  Occupational History   Occupation: Scientist, product/process development  Tobacco Use   Smoking status: Former    Current packs/day: 0.00    Average packs/day: 0.5 packs/day for 12.0 years (6.0 ttl pk-yrs)    Types: Cigarettes    Start date: 09/16/1958    Quit date: 09/16/1970    Years since quitting: 53.0   Smokeless tobacco: Never   Tobacco comments:    hasn't smoked in 46 years;   Substance and Sexual Activity   Alcohol use: Yes    Alcohol/week: 0.0 standard drinks of alcohol    Comment: occasional;    Drug use: No   Sexual activity: Not on file  Other Topics Concern   Not on file  Social History Narrative   Not on file   Social Drivers of Health   Financial Resource Strain: Not on file  Food Insecurity: Not on file  Transportation Needs: Not on file  Physical Activity: Not on  file  Stress: Not on file  Social Connections: Unknown (01/29/2022)   Received from [facility]   Social Network    Social Network: Not on file  Intimate Partner Violence: Unknown (12/21/2021)   Received from [facility]   HITS    Physically Hurt: Not on file    Insult or Talk Down To: Not on file    Threaten Physical Harm: Not on file    Scream or Curse: Not on file    FAMILY HISTORY: Family History  Problem Relation Age of Onset   Hypertension Mother    Cancer Father        prostate cancer   Prostate cancer Father    Hypertension Sister    Hypertension Brother    Colon cancer Neg Hx    Rectal cancer Neg  Hx    Stomach cancer Neg Hx    Esophageal cancer Neg Hx    Pancreatic cancer Neg Hx     ALLERGIES:  has no known allergies.  MEDICATIONS:  Current Outpatient Medications  Medication Sig Dispense Refill   carvedilol (COREG) 12.5 MG tablet TAKE 1 TABLET (12.5 MG TOTAL) BY MOUTH 2 (TWO) TIMES DAILY WITH A MEAL. 60 tablet 5   Cholecalciferol 50 MCG (2000 UT) TABS 1 tab by mouth once daily (Patient not taking: Reported on 08/11/2023) 30 tablet 99   fluticasone (FLONASE) 50 MCG/ACT nasal spray Place 2 sprays into both nostrils daily. (Patient not taking: Reported on 08/11/2023) 16 g 6   furosemide (LASIX) 20 MG tablet TAKE 1 TABLET BY MOUTH DAILY AS NEEDED FOR EDEMA.PLEASE CALL TO SCHEDULE OVERDUE APPT WITH DR KELLEY 30 tablet 0   hydroxyurea (HYDREA) 500 MG capsule Take 1 tablet (500 mg) by mouth in the morning and 2 tables (1000 mg) by mouth in the evening. May take with food to minimize GI side effects. 90 capsule 6   lisinopril (ZESTRIL) 2.5 MG tablet TAKE 1 TABLET BY MOUTH EVERY DAY 90 tablet 3   lovastatin (MEVACOR) 40 MG tablet TAKE 1 TABLET BY MOUTH EVERY DAY 90 tablet 3   predniSONE (STERAPRED UNI-PAK 21 TAB) 10 MG (21) TBPK tablet As directed x 6 days 21 tablet 0   tadalafil (CIALIS) 20 MG tablet Take 0.5-1 tablets (10-20 mg total) by mouth every other day as needed for erectile dysfunction. 10 tablet 11   tamsulosin (FLOMAX) 0.4 MG CAPS capsule TAKE 1 CAPSULE BY MOUTH EVERY DAY 90 capsule 1   tiZANidine (ZANAFLEX) 2 MG tablet Take 1 tablet (2 mg total) by mouth every 6 (six) hours as needed for muscle spasms. (Patient not taking: Reported on 08/11/2023) 30 tablet 0   No current facility-administered medications for this visit.    REVIEW OF SYSTEMS:   Constitutional: ( - ) fevers, ( - )  chills , ( - ) night sweats Eyes: ( - ) blurriness of vision, ( - ) double vision, ( - ) watery eyes Ears, nose, mouth, throat, and face: ( - ) mucositis, ( - ) sore throat Respiratory: ( - ) cough,  ( - ) dyspnea, ( - ) wheezes Cardiovascular: ( - ) palpitation, ( - ) chest discomfort, ( - ) lower extremity swelling Gastrointestinal:  ( - ) nausea, ( - ) heartburn, ( - ) change in bowel habits Skin: ( - ) abnormal skin rashes Lymphatics: ( - ) new lymphadenopathy, ( - ) easy bruising Neurological: ( - ) numbness, ( - ) tingling, ( - )  new weaknesses Behavioral/Psych: ( - ) mood change, ( - ) new changes  All other systems were reviewed with the patient and are negative.  PHYSICAL EXAMINATION: ECOG PERFORMANCE STATUS: 0 - Asymptomatic  There were no vitals filed for this visit.  There were no vitals filed for this visit.   GENERAL: well appearing male in NAD  SKIN: skin color, texture, turgor are normal, no rashes or significant lesions EYES: conjunctiva are pink and non-injected, sclera clear LUNGS: clear to auscultation and percussion with normal breathing effort HEART: regular rate & rhythm and no murmurs and no lower extremity edema Musculoskeletal: no cyanosis of digits and no clubbing  PSYCH: alert & oriented x 3, fluent speech NEURO: no focal motor/sensory deficits  LABORATORY DATA:  I have reviewed the data as listed    Latest Ref Rng & Units 06/17/2023    2:34 PM 01/31/2023    4:06 PM 11/29/2022   10:20 AM  CBC  WBC 4.0 - 10.5 K/uL 11.4  8.9  10.3   Hemoglobin 13.0 - 17.0 g/dL 29.5  28.4  13.2   Hematocrit 39.0 - 52.0 % 37.1  43.7  53.8   Platelets 150 - 400 K/uL 421  554.0  505        Latest Ref Rng & Units 06/17/2023    2:34 PM 01/31/2023    4:06 PM 11/29/2022   10:20 AM  CMP  Glucose 70 - 99 mg/dL 440  89  80   BUN 8 - 23 mg/dL 15  14  18    Creatinine 0.61 - 1.24 mg/dL 1.02  7.25  3.66   Sodium 135 - 145 mmol/L 139  140  139   Potassium 3.5 - 5.1 mmol/L 4.2  4.5  4.7   Chloride 98 - 111 mmol/L 107  104  105   CO2 22 - 32 mmol/L 26  31  29    Calcium 8.9 - 10.3 mg/dL 9.5  9.5  9.8   Total Protein 6.5 - 8.1 g/dL 6.6  6.6  6.9   Total Bilirubin 0.3 - 1.2  mg/dL 0.5  0.7  1.1   Alkaline Phos 38 - 126 U/L 50  70  64   AST 15 - 41 U/L 14  16  16    ALT 0 - 44 U/L 10  17  11       PATHOLOGY: Collected Date: 11/20/2009   Received Date: 11/20/2009    FINAL DIAGNOSIS    Bone Marrow, Aspirate,Biopsy, and Clot, left iliac crest :   - HYPERCELLULAR MARROW WITH FEATURES OF CHRONIC MYELOPROLIFERATIVE DISORDER.   - SEE COMMENT.   PERIPHERAL BLOOD:   - THROMBOCYTOSIS.   ASSESSMENT & PLAN ZAXTON MANALAC is a 77 y.o. male who presents for a follow up for JAK2 positive essential thrombocythemia.   #YQI3 Essential Thrombocythemia: --Currently on Hydrea 1000 mg PO daily --Labs today reviewed with patient. WBC ***. Creatinine and LFTs in range.  --Currently taking Hydrea 500 mg in the morning and 1000 mg in the evening daily. Reviewed dose with Dr. Leonides Schanz who recommends keep Hydrea dose the same.  --Recommend to check for iron, B12 and folate levels to further evaluate new onset anemia. --RTC in 6 weeks for lab check and 12 weeks for labs/follow up.   No orders of the defined types were placed in this encounter.   All questions were answered. The patient knows to call the clinic with any problems, questions or concerns.  I have spent a total of  30 minutes minutes of face-to-face and non-face-to-face time, preparing to see the patient, performing a medically appropriate examination, counseling and educating the patient, ordering medications, documenting clinical information in the electronic health record, and care coordination.   Georga Kaufmann, PA-C Department of Hematology/Oncology [facility] at [facility] Phone: [phone]

## 2023-09-09 ENCOUNTER — Encounter: Payer: Self-pay | Admitting: Oncology

## 2023-10-14 ENCOUNTER — Inpatient Hospital Stay: Payer: Medicare (Managed Care) | Attending: Physician Assistant | Admitting: Physician Assistant

## 2023-10-14 ENCOUNTER — Inpatient Hospital Stay: Payer: Medicare (Managed Care)

## 2023-10-14 VITALS — BP 181/93 | HR 86 | Temp 97.3°F | Resp 14 | Wt 170.7 lb

## 2023-10-14 DIAGNOSIS — D473 Essential (hemorrhagic) thrombocythemia: Secondary | ICD-10-CM | POA: Insufficient documentation

## 2023-10-14 DIAGNOSIS — D75839 Thrombocytosis, unspecified: Secondary | ICD-10-CM

## 2023-10-14 DIAGNOSIS — D509 Iron deficiency anemia, unspecified: Secondary | ICD-10-CM

## 2023-10-14 DIAGNOSIS — Z8042 Family history of malignant neoplasm of prostate: Secondary | ICD-10-CM | POA: Insufficient documentation

## 2023-10-14 DIAGNOSIS — N183 Chronic kidney disease, stage 3 unspecified: Secondary | ICD-10-CM | POA: Diagnosis not present

## 2023-10-14 DIAGNOSIS — Z79899 Other long term (current) drug therapy: Secondary | ICD-10-CM | POA: Insufficient documentation

## 2023-10-14 DIAGNOSIS — Z87891 Personal history of nicotine dependence: Secondary | ICD-10-CM | POA: Insufficient documentation

## 2023-10-14 DIAGNOSIS — I129 Hypertensive chronic kidney disease with stage 1 through stage 4 chronic kidney disease, or unspecified chronic kidney disease: Secondary | ICD-10-CM | POA: Diagnosis not present

## 2023-10-14 LAB — CBC WITH DIFFERENTIAL (CANCER CENTER ONLY)
Abs Immature Granulocytes: 0.1 10*3/uL — ABNORMAL HIGH (ref 0.00–0.07)
Basophils Absolute: 0.1 10*3/uL (ref 0.0–0.1)
Basophils Relative: 1 %
Eosinophils Absolute: 0.2 10*3/uL (ref 0.0–0.5)
Eosinophils Relative: 1 %
HCT: 43.5 % (ref 39.0–52.0)
Hemoglobin: 13.2 g/dL (ref 13.0–17.0)
Immature Granulocytes: 1 %
Lymphocytes Relative: 10 %
Lymphs Abs: 1.4 10*3/uL (ref 0.7–4.0)
MCH: 30 pg (ref 26.0–34.0)
MCHC: 30.3 g/dL (ref 30.0–36.0)
MCV: 98.9 fL (ref 80.0–100.0)
Monocytes Absolute: 0.6 10*3/uL (ref 0.1–1.0)
Monocytes Relative: 4 %
Neutro Abs: 11.6 10*3/uL — ABNORMAL HIGH (ref 1.7–7.7)
Neutrophils Relative %: 83 %
Platelet Count: 665 10*3/uL — ABNORMAL HIGH (ref 150–400)
RBC: 4.4 MIL/uL (ref 4.22–5.81)
RDW: 19.5 % — ABNORMAL HIGH (ref 11.5–15.5)
WBC Count: 13.9 10*3/uL — ABNORMAL HIGH (ref 4.0–10.5)
nRBC: 0 % (ref 0.0–0.2)

## 2023-10-14 LAB — CMP (CANCER CENTER ONLY)
ALT: 14 U/L (ref 0–44)
AST: 17 U/L (ref 15–41)
Albumin: 4.4 g/dL (ref 3.5–5.0)
Alkaline Phosphatase: 65 U/L (ref 38–126)
Anion gap: 6 (ref 5–15)
BUN: 18 mg/dL (ref 8–23)
CO2: 28 mmol/L (ref 22–32)
Calcium: 9.9 mg/dL (ref 8.9–10.3)
Chloride: 105 mmol/L (ref 98–111)
Creatinine: 1.21 mg/dL (ref 0.61–1.24)
GFR, Estimated: >60 mL/min (ref >60)
Glucose, Bld: 109 mg/dL — ABNORMAL HIGH (ref 70–99)
Potassium: 4 mmol/L (ref 3.5–5.1)
Sodium: 139 mmol/L (ref 135–145)
Total Bilirubin: 0.6 mg/dL (ref 0.0–1.2)
Total Protein: 7.1 g/dL (ref 6.5–8.1)

## 2023-10-14 LAB — FERRITIN: Ferritin: 58 ng/mL (ref 24–336)

## 2023-10-14 NOTE — Progress Notes (Unsigned)
[facility] [phone]   [phone]  PROGRESS NOTE  Patient Care Team: Corwin Levins, MD as PCP - General Louis Meckel, MD (Inactive) as Consulting Physician (Gastroenterology)  CHIEF COMPLAINTS/PURPOSE OF CONSULTATION:  JAK2 positive essential thrombocythemia.   CURRENT TREATMENT: Hydrea  500 mg in the AM and 1000 mg in the PM   HISTORY OF PRESENTING ILLNESS:  Dillon [name] 78 y.o. male returns for a follow up for JAK2 positive essential thrombocythemia. He was last seen by on 06/17/2023. He is unaccompanied for this visit.   On exam today, Dillon [name] reports he is tolerating Hydrea therapy without any toxicities. He had one episode of a headache a few days ago which resolved on its own. His energy and appetite are stable. He is able to complete his daily activities on his own. He denies nausea, vomiting or bowel habit changes. He denies easy bruising or signs of active bleeding. He denies fevers, chills, sweats, shortness of breath, chest pain or cough. He has no other complaints. Rest of the 10 point ROS is below.   MEDICAL HISTORY:  Past Medical History:  Diagnosis Date   Arthritis    right shoulder   Bladder neck obstruction 04/18/2011   CKD (chronic kidney disease), stage III (HCC) 09/12/2015   ERECTILE DYSFUNCTION 11/15/2008   Essential hypertension, benign 04/28/2013   FATIGUE 11/15/2008   GERD 11/15/2008   History of adenomatous polyp of colon 05/06/2014   2010   HYPERLIPIDEMIA 11/15/2008   Impaired glucose tolerance 04/18/2011   LEG PAIN, LEFT 11/05/2010   LVH (left ventricular hypertrophy) 04/27/2012   MIGRAINE, COMMON 08/06/2010   Nocturia 12/19/2008   Polycythemia 04/27/2012   Swelling of limb 11/05/2010   THROMBOCYTOSIS 12/19/2008   myleoproliferative disorder    SURGICAL HISTORY: Past Surgical History:  Procedure Laterality Date   APPENDECTOMY     CARDIAC CATHETERIZATION N/A 09/05/2015   Procedure: Left Heart Cath and  Coronary Angiography;  Surgeon: Lennette Bihari, MD;  Location: [facility];  Service: Cardiovascular;  Laterality: N/A;   CIRCUMCISION     30+yrs ago   COLONOSCOPY     POLYPECTOMY     s/p esophageal dilation     TONSILLECTOMY     UPPER GASTROINTESTINAL ENDOSCOPY      SOCIAL HISTORY: Social History   Socioeconomic History   Marital status: Married    Spouse name: Not on file   Number of children: Not on file   Years of education: Not on file   Highest education level: Not on file  Occupational History   Occupation: Scientist, product/process development  Tobacco Use   Smoking status: Former    Current packs/day: 0.00    Average packs/day: 0.5 packs/day for 12.0 years (6.0 ttl pk-yrs)    Types: Cigarettes    Start date: 09/16/1958    Quit date: 09/16/1970    Years since quitting: 53.1   Smokeless tobacco: Never   Tobacco comments:    hasn't smoked in 46 years;   Substance and Sexual Activity   Alcohol use: Yes    Alcohol/week: 0.0 standard drinks of alcohol    Comment: occasional;    Drug use: No   Sexual activity: Not on file  Other Topics Concern   Not on file  Social History Narrative   Not on file   Social Drivers of Health   Financial Resource Strain: Not on file  Food Insecurity: Not on file  Transportation Needs: Not on file  Physical Activity: Not on file  Stress: Not on file  Social Connections: Unknown (01/29/2022)   [facility] from [facility]   Social Network    Social Network: Not on file  Intimate Partner Violence: Unknown (12/21/2021)   Received [facility]   HITS    Physically Hurt: Not on file    Insult or Talk Down To: Not on file    Threaten Physical Harm: Not on file    Scream or Curse: Not on file    FAMILY HISTORY: Family History  Problem Relation Age of Onset   Hypertension Mother    Cancer Father        prostate cancer   Prostate cancer Father    Hypertension Sister    Hypertension Brother    Colon cancer Neg  Hx    Rectal cancer Neg Hx    Stomach cancer Neg Hx    Esophageal cancer Neg Hx    Pancreatic cancer Neg Hx     ALLERGIES:  has no known allergies.  MEDICATIONS:  Current Outpatient Medications  Medication Sig Dispense Refill   carvedilol (COREG) 12.5 MG tablet TAKE 1 TABLET (12.5 MG TOTAL) BY MOUTH 2 (TWO) TIMES DAILY WITH A MEAL. 60 tablet 5   furosemide (LASIX) 20 MG tablet TAKE 1 TABLET BY MOUTH DAILY AS NEEDED FOR EDEMA.PLEASE CALL TO SCHEDULE OVERDUE APPT WITH DR KELLEY 30 tablet 0   hydroxyurea (HYDREA) 500 MG capsule Take 1 tablet (500 mg) by mouth in the morning and 2 tables (1000 mg) by mouth in the evening. May take with food to minimize GI side effects. 90 capsule 6   lisinopril (ZESTRIL) 2.5 MG tablet TAKE 1 TABLET BY MOUTH EVERY DAY 90 tablet 3   lovastatin (MEVACOR) 40 MG tablet TAKE 1 TABLET BY MOUTH EVERY DAY 90 tablet 3   predniSONE (STERAPRED UNI-PAK 21 TAB) 10 MG (21) TBPK tablet As directed x 6 days 21 tablet 0   tadalafil (CIALIS) 20 MG tablet Take 0.5-1 tablets (10-20 mg total) by mouth every other day as needed for erectile dysfunction. 10 tablet 11   tamsulosin (FLOMAX) 0.4 MG CAPS capsule TAKE 1 CAPSULE BY MOUTH EVERY DAY 90 capsule 1   Cholecalciferol 50 MCG (2000 UT) TABS 1 tab by mouth once daily (Patient not taking: Reported on 10/14/2023) 30 tablet 99   fluticasone (FLONASE) 50 MCG/ACT nasal spray Place 2 sprays into both nostrils daily. (Patient not taking: Reported on 10/14/2023) 16 g 6   tiZANidine (ZANAFLEX) 2 MG tablet Take 1 tablet (2 mg total) by mouth every 6 (six) hours as needed for muscle spasms. (Patient not taking: Reported on 10/14/2023) 30 tablet 0   No current facility-administered medications for this visit.    REVIEW OF SYSTEMS:   Constitutional: ( - ) fevers, ( - )  chills , ( - ) night sweats Eyes: ( - ) blurriness of vision, ( - ) double vision, ( - ) watery eyes Ears, nose, mouth, throat, and face: ( - ) mucositis, ( - ) sore  throat Respiratory: ( - ) cough, ( - ) dyspnea, ( - ) wheezes Cardiovascular: ( - ) palpitation, ( - ) chest discomfort, ( - ) lower extremity swelling Gastrointestinal:  ( - ) nausea, ( - ) heartburn, ( - ) change in bowel habits Skin: ( - ) abnormal skin rashes Lymphatics: ( - ) new lymphadenopathy, ( - ) easy bruising Neurological: ( - ) numbness, ( - )  tingling, ( - ) new weaknesses Behavioral/Psych: ( - ) mood change, ( - ) new changes  All other systems were reviewed with the patient and are negative.  PHYSICAL EXAMINATION: ECOG PERFORMANCE STATUS: 0 - Asymptomatic  Vitals:   10/14/23 1326  BP: (!) 178/90  Pulse: 86  Resp: 14  Temp: (!) 97.3 F (36.3 C)  SpO2: 99%   Filed Weights   10/14/23 1326  Weight: 170 lb 11.2 oz (77.4 kg)    GENERAL: well appearing male in NAD  SKIN: skin color, texture, turgor are normal, no rashes or significant lesions EYES: conjunctiva are pink and non-injected, sclera clear LUNGS: clear to auscultation and percussion with normal breathing effort HEART: regular rate & rhythm and no murmurs and no lower extremity edema Musculoskeletal: no cyanosis of digits and no clubbing  PSYCH: alert & oriented x 3, fluent speech NEURO: no focal motor/sensory deficits  LABORATORY DATA:  I have reviewed the data as listed    Latest Ref Rng & Units 10/14/2023   12:58 PM 06/17/2023    2:34 PM 01/31/2023    4:06 PM  CBC  WBC 4.0 - 10.5 K/uL 13.9  11.4  8.9   Hemoglobin 13.0 - 17.0 g/dL 16.1  09.6  04.5   Hematocrit 39.0 - 52.0 % 43.5  37.1  43.7   Platelets 150 - 400 K/uL 665  421  554.0        Latest Ref Rng & Units 10/14/2023   12:58 PM 06/17/2023    2:34 PM 01/31/2023    4:06 PM  CMP  Glucose 70 - 99 mg/dL 409  811  89   BUN 8 - 23 mg/dL 18  15  14    Creatinine 0.61 - 1.24 mg/dL 9.14  7.82  9.56   Sodium 135 - 145 mmol/L 139  139  140   Potassium 3.5 - 5.1 mmol/L 4.0  4.2  4.5   Chloride 98 - 111 mmol/L 105  107  104   CO2 22 - 32 mmol/L 28   26  31    Calcium 8.9 - 10.3 mg/dL 9.9  9.5  9.5   Total Protein 6.5 - 8.1 g/dL 7.1  6.6  6.6   Total Bilirubin 0.0 - 1.2 mg/dL 0.6  0.5  0.7   Alkaline Phos 38 - 126 U/L 65  50  70   AST 15 - 41 U/L 17  14  16    ALT 0 - 44 U/L 14  10  17       PATHOLOGY: Collected Date: 11/20/2009   Received Date: 11/20/2009    FINAL DIAGNOSIS    Bone Marrow, Aspirate,Biopsy, and Clot, left iliac crest :   - HYPERCELLULAR MARROW WITH FEATURES OF CHRONIC MYELOPROLIFERATIVE DISORDER.   - SEE COMMENT.   PERIPHERAL BLOOD:   - THROMBOCYTOSIS.   ASSESSMENT & PLAN Dillon [name] is a 78 y.o. male who presents for a follow up for JAK2 positive essential thrombocythemia.   #OZH0 Essential Thrombocythemia --Labs today reviewed with patient. WBC 13.9, Hgb 13.2, MCV 98.9, Plt 665K. Creatinine and LFTs in range.  --Currently taking Hydrea 500 mg in the morning and 1000 mg in the evening daily. Recommends to increase Hydrea dose to 1000 mg BID since plt count increased from 421K to 665K. Patient reports compliance without skipping doses.  .--RTC in 6 weeks for lab check and 12 weeks for labs/follow up.   #Hypertension: --BP was 181/93 with recheck --Currently taking lisinopril 2.5 mg daily and  coreg 12.5 mg BID --Advised to check BP at home daily and follow up with PCP. I sent message to request follow up with PCP as well.    No orders of the defined types were placed in this encounter.   All questions were answered. The patient knows to call the clinic with any problems, questions or concerns.  I have spent a total of 30 minutes minutes of face-to-face and non-face-to-face time, preparing to see the patient, performing a medically appropriate examination, counseling and educating the patient, ordering medications, documenting clinical information in the electronic health record, and care coordination.   Georga Kaufmann, PA-C Department of Hematology/Oncology [facility] at [facility] Phone: [phone]

## 2023-10-15 ENCOUNTER — Ambulatory Visit (INDEPENDENT_AMBULATORY_CARE_PROVIDER_SITE_OTHER): Payer: Medicare (Managed Care)

## 2023-10-15 VITALS — Ht 69.0 in | Wt 170.0 lb

## 2023-10-15 DIAGNOSIS — Z0000 Encounter for general adult medical examination without abnormal findings: Secondary | ICD-10-CM | POA: Diagnosis not present

## 2023-10-15 NOTE — Patient Instructions (Signed)
Dillon [name] , Thank you for taking time to come for your Medicare Wellness Visit. I appreciate your ongoing commitment to your health goals. Please review the following plan we discussed and let me know if I can assist you in the future.   Referrals/Orders/Follow-Ups/Clinician Recommendations: It was nice talking with you today.  You are due for a colonoscopy, please discuss with PCP during your up coming visit.  Also remember to ask about a referral to see and eye doctor.  Each day, aim for 6 glasses of water, plenty of protein in your diet and try to get up and walk/ stretch every hour for 5-10 minutes at a time.    This is a list of the screening recommended for you and due dates:  Health Maintenance  Topic Date Due   Colon Cancer Screening  12/01/2022   Medicare Annual Wellness Visit  10/14/2024   DTaP/Tdap/Td vaccine (3 - Td or Tdap) 05/13/2029   Pneumonia Vaccine  Completed   Flu Shot  Completed   Zoster (Shingles) Vaccine  Completed   HPV Vaccine  Aged Out   COVID-19 Vaccine  Discontinued   Hepatitis C Screening  Discontinued    Advanced directives: (Declined) Advance directive discussed with you today. Even though you declined this today, please call our office should you change your mind, and we can give you the proper paperwork for you to fill out.  Next Medicare Annual Wellness Visit scheduled for next year: Yes

## 2023-10-15 NOTE — Progress Notes (Signed)
Subjective:   Dillon [name] is a 78 y.o. male who presents for Medicare Annual/Subsequent preventive examination.  Visit Complete: Virtual I connected with  Dillon [name] on 10/15/23 by a audio enabled telemedicine application and verified that I am speaking with the correct person using two identifiers.  Patient Location: Home  Provider Location: Office/Clinic  I discussed the limitations of evaluation and management by telemedicine. The patient expressed understanding and agreed to proceed.  Vital Signs: Because this visit was a virtual/telehealth visit, some criteria may be missing or patient reported. Any vitals not documented were not able to be obtained and vitals that have been documented are patient reported.   Cardiac Risk Factors include: advanced age (>72men, >7 women);hypertension;Other (see comment);dyslipidemia, Risk factor comments: (nonischemic cardiomyopathy, LVH, HF, CKD     Objective:    Today's Vitals   10/15/23 1244  Weight: 170 lb (77.1 kg)  Height: 5\' 9"  (1.753 m)   Body mass index is 25.1 kg/m.     10/15/2023    1:15 PM 11/23/2019    2:10 PM 02/15/2017   12:02 AM 02/14/2017    6:31 PM 09/01/2015    2:20 AM 08/31/2015    7:00 PM 05/04/2015    5:28 PM  Advanced Directives  Does Patient Have a Medical Advance Directive? No No No No No No No  Would patient like information on creating a medical advance directive?  No - Patient declined No - Patient declined No - Patient declined Yes - Educational materials given No - patient declined information     Current Medications (verified) Outpatient Encounter Medications as of 10/15/2023  Medication Sig   carvedilol (COREG) 12.5 MG tablet TAKE 1 TABLET (12.5 MG TOTAL) BY MOUTH 2 (TWO) TIMES DAILY WITH A MEAL.   furosemide (LASIX) 20 MG tablet TAKE 1 TABLET BY MOUTH DAILY AS NEEDED FOR EDEMA.PLEASE CALL TO SCHEDULE OVERDUE APPT WITH DR Nicholaus Bloom   hydroxyurea (HYDREA) 500 MG capsule Take 1 tablet (500 mg) by  mouth in the morning and 2 tables (1000 mg) by mouth in the evening. May take with food to minimize GI side effects.   lisinopril (ZESTRIL) 2.5 MG tablet TAKE 1 TABLET BY MOUTH EVERY DAY   lovastatin (MEVACOR) 40 MG tablet TAKE 1 TABLET BY MOUTH EVERY DAY   tadalafil (CIALIS) 20 MG tablet Take 0.5-1 tablets (10-20 mg total) by mouth every other day as needed for erectile dysfunction.   tamsulosin (FLOMAX) 0.4 MG CAPS capsule TAKE 1 CAPSULE BY MOUTH EVERY DAY   tiZANidine (ZANAFLEX) 2 MG tablet Take 1 tablet (2 mg total) by mouth every 6 (six) hours as needed for muscle spasms.   Cholecalciferol 50 MCG (2000 UT) TABS 1 tab by mouth once daily (Patient not taking: Reported on 08/11/2023)   fluticasone (FLONASE) 50 MCG/ACT nasal spray Place 2 sprays into both nostrils daily. (Patient not taking: Reported on 08/11/2023)   predniSONE (STERAPRED UNI-PAK 21 TAB) 10 MG (21) TBPK tablet As directed x 6 days (Patient not taking: Reported on 10/15/2023)   No facility-administered encounter medications on file as of 10/15/2023.    Allergies (verified) Patient has no known allergies.   History: Past Medical History:  Diagnosis Date   Arthritis    right shoulder   Bladder neck obstruction 04/18/2011   CKD (chronic kidney disease), stage III (HCC) 09/12/2015   ERECTILE DYSFUNCTION 11/15/2008   Essential hypertension, benign 04/28/2013   FATIGUE 11/15/2008   GERD 11/15/2008   History of adenomatous polyp  of colon 05/06/2014   2010   HYPERLIPIDEMIA 11/15/2008   Impaired glucose tolerance 04/18/2011   LEG PAIN, LEFT 11/05/2010   LVH (left ventricular hypertrophy) 04/27/2012   MIGRAINE, COMMON 08/06/2010   Nocturia 12/19/2008   Polycythemia 04/27/2012   Swelling of limb 11/05/2010   THROMBOCYTOSIS 12/19/2008   myleoproliferative disorder   Past Surgical History:  Procedure Laterality Date   APPENDECTOMY     CARDIAC CATHETERIZATION N/A 09/05/2015   Procedure: Left Heart Cath and Coronary Angiography;  Surgeon:  Lennette Bihari, MD;  Location: [facility] INVASIVE CV LAB;  Service: Cardiovascular;  Laterality: N/A;   CIRCUMCISION     30+yrs ago   COLONOSCOPY     POLYPECTOMY     s/p esophageal dilation     TONSILLECTOMY     UPPER GASTROINTESTINAL ENDOSCOPY     Family History  Problem Relation Age of Onset   Hypertension Mother    Cancer Father        prostate cancer   Prostate cancer Father    Hypertension Sister    Hypertension Brother    Colon cancer Neg Hx    Rectal cancer Neg Hx    Stomach cancer Neg Hx    Esophageal cancer Neg Hx    Pancreatic cancer Neg Hx    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  Tobacco Use   Smoking status: Former    Current packs/day: 0.00    Average packs/day: 0.5 packs/day for 12.0 years (6.0 ttl pk-yrs)    Types: Cigarettes    Start date: 09/16/1958    Quit date: 09/16/1970    Years since quitting: 53.1   Smokeless tobacco: Never   Tobacco comments:    hasn't smoked in 46 years;   Vaping Use   Vaping status: Never Used  Substance and Sexual Activity   Alcohol use: Yes    Alcohol/week: 0.0 standard drinks of alcohol    Comment: occasional;    Drug use: No   Sexual activity: Not on file  Other Topics Concern   Not on file  Social History Narrative   Lives with sister.   Social Drivers of Health   Financial Resource Strain: Medium Risk (10/15/2023)   Overall Financial Resource Strain (CARDIA)    Difficulty of Paying Living Expenses: Somewhat hard  Food Insecurity: Food Insecurity Present (10/15/2023)   Hunger Vital Sign    Worried About Running Out of Food in the Last Year: Sometimes true    Ran Out of Food in the Last Year: Sometimes true  Transportation Needs: No Transportation Needs (10/15/2023)   PRAPARE -  Administrator, Civil Service (Medical): No    Lack of Transportation (Non-Medical): No  Physical Activity: Inactive (10/15/2023)   Exercise Vital Sign    Days of Exercise per Week: 0 days    Minutes of Exercise per Session: 0 min  Stress: No Stress Concern Present (10/15/2023)   [employer] of Occupational Health - Occupational Stress Questionnaire    Feeling of Stress : Not at all  Social Connections: Moderately Integrated (10/15/2023)   Social Connection and Isolation Panel [NHANES]    Frequency of Communication with Friends and Family: Three times a week    Frequency of  Social Gatherings with Friends and Family: Once a week    Attends Religious Services: More than 4 times per year    Active Member of [employer] or Organizations: Yes    Attends Banker Meetings: Never    Marital Status: Separated    Tobacco Counseling Counseling given: Not Answered Tobacco comments: hasn't smoked in 46 years;    Clinical Intake:  Pre-visit preparation completed: Yes  Pain : No/denies pain     BMI - recorded: 25.1 Nutritional Status: BMI 25 -29 Overweight Nutritional Risks: None Diabetes: No  How often do you need to have someone help you when you read instructions, pamphlets, or other written materials from your doctor or pharmacy?: 1 - Never     Information entered by :: Paz Fuentes, RMA   Activities of Daily Living    10/15/2023   12:46 PM  In your present state of health, do you have any difficulty performing the following activities:  Hearing? 0  Vision? 0  Difficulty concentrating or making decisions? 0  Walking or climbing stairs? 0  Dressing or bathing? 0  Doing errands, shopping? 0  Preparing Food and eating ? N  Using the Toilet? N  In the past six months, have you accidently leaked urine? N  Do you have problems with loss of bowel control? N  Managing your Medications? N  Managing your Finances? N  Housekeeping or managing your  Housekeeping? N    Patient Care Team: Corwin Levins, MD as PCP - General Arlyce Dice Barbette Hair, MD (Inactive) as Consulting Physician (Gastroenterology)  Indicate any recent Medical Services you may have received from other than [facility] providers in the past year (date may be approximate).     Assessment:   This is a routine wellness examination for Dillon [name].  Hearing/Vision screen Hearing Screening - Comments:: Denies hearing difficulties   Vision Screening - Comments:: Denies vision issues.    Goals Addressed               This Visit's Progress     Patient Stated (pt-stated)        Drink more water.       Depression Screen    10/15/2023    1:21 PM 08/11/2023    2:11 PM 01/31/2023    3:41 PM 01/24/2022    3:52 PM 01/02/2021    2:36 PM 05/03/2020    4:28 PM 11/10/2019    2:59 PM  PHQ 2/9 Scores  PHQ - 2 Score 0 0 0 0 0 0 0  PHQ- 9 Score 0   0       Fall Risk    10/15/2023    1:15 PM 08/11/2023    2:11 PM 01/31/2023    3:41 PM 01/24/2022    4:11 PM 01/24/2022    3:52 PM  Fall Risk   Falls in the past year? 1 0 0 0 0  Number falls in past yr: 1 0 0 0 0  Injury with Fall? 0 0 0 0 0  Risk for fall due to : No Fall Risks No Fall Risks No Fall Risks    Follow up Falls prevention discussed;Falls evaluation completed Falls evaluation completed Falls evaluation completed      MEDICARE RISK AT HOME: Medicare Risk at Home Any stairs in or around the home?: No Home free of loose throw rugs in walkways, pet beds, electrical cords, etc?: Yes Adequate lighting in your home to reduce risk of falls?: Yes Life alert?: No  Use of a cane, walker or w/c?: No Grab bars in the bathroom?: No Shower chair or bench in shower?: No Elevated toilet seat or a handicapped toilet?: No  TIMED UP AND GO:  Was the test performed?  No    Cognitive Function:    05/04/2015    5:02 PM  MMSE - Mini Mental State Exam  Not completed: Unable to complete        10/15/2023    1:06 PM  6CIT Screen   What Year? 0 points  What month? 0 points  What time? 0 points  Count back from 20 0 points  Months in reverse 0 points  Repeat phrase 10 points  Total Score 10 points    Immunizations Immunization History  Administered Date(s) Administered   Fluad Quad(high Dose 65+) 06/19/2022   H1N1 11/15/2008   Influenza Split 07/28/2012   Influenza Whole 11/29/2009   Influenza, High Dose Seasonal PF 05/18/2015, 05/01/2017, 06/16/2018, 05/06/2019, 05/12/2020, 07/04/2021   Influenza, Seasonal, Injecte, Preservative Fre 06/16/2013   Influenza,inj,Quad PF,6+ Mos 05/06/2014   Influenza-Unspecified 05/12/2017, 05/08/2019   PFIZER Comirnaty(Gray Top)Covid-19 Tri-Sucrose Vaccine 01/10/2021   PFIZER(Purple Top)SARS-COV-2 Vaccination 10/16/2019, 11/05/2019, 06/18/2020, 07/02/2021   Pfizer Covid-19 Vaccine Bivalent Booster 12yrs & up 10/04/2021, 04/07/2022   Pneumococcal Conjugate-13 07/29/2013   Pneumococcal Polysaccharide-23 03/04/2007, 05/04/2015   Respiratory Syncytial Virus Vaccine,Recomb Aduvanted(Arexvy) 06/19/2022   Td 11/15/2008   Tdap 05/14/2019   Zoster Recombinant(Shingrix) 05/12/2020, 07/13/2020   Zoster, Live 03/04/2007    TDAP status: Up to date  Flu Vaccine status: Up to date  Pneumococcal vaccine status: Up to date  Covid-19 vaccine status: Information provided on how to obtain vaccines.   Qualifies for Shingles Vaccine? Yes   Zostavax completed Yes   Shingrix Completed?: Yes  Screening Tests Health Maintenance  Topic Date Due   Colonoscopy  12/01/2022   Medicare Annual Wellness (AWV)  10/14/2024   DTaP/Tdap/Td (3 - Td or Tdap) 05/13/2029   Pneumonia Vaccine 87+ Years old  Completed   INFLUENZA VACCINE  Completed   Zoster Vaccines- Shingrix  Completed   HPV VACCINES  Aged Out   COVID-19 Vaccine  Discontinued   Hepatitis C Screening  Discontinued    Health Maintenance  Health Maintenance Due  Topic Date Due   Colonoscopy  12/01/2022    Colorectal cancer  screening: Type of screening: Colonoscopy. Completed 12/01/2019. Repeat every 3 years  Lung Cancer Screening: (Low Dose CT Chest recommended if Age 33-80 years, 20 pack-year currently smoking OR have quit w/in 15years.) does not qualify.   Lung Cancer Screening Referral: N/A  Additional Screening:  Hepatitis C Screening: does qualify; Completed 05/09/20174  Vision Screening: Recommended annual ophthalmology exams for early detection of glaucoma and other disorders of the eye. Is the patient up to date with their annual eye exam?  No  Who is the provider or what is the name of the office in which the patient attends annual eye exams? NEED REFERRAL If pt is not established with a provider, would they like to be referred to a provider to establish care? Yes .   Dental Screening: Recommended annual dental exams for proper oral hygiene   Community Resource Referral / Chronic Care Management: CRR required this visit?  No   CCM required this visit?  No     Plan:     I have personally reviewed and noted the following in the patient's chart:   Medical and social history Use of alcohol, tobacco or illicit drugs  Current medications and supplements including opioid prescriptions. Patient is not currently taking opioid prescriptions. Functional ability and status Nutritional status Physical activity Advanced directives List of other physicians Hospitalizations, surgeries, and ER visits in previous 12 months Vitals Screenings to include cognitive, depression, and falls Referrals and appointments  In addition, I have reviewed and discussed with patient certain preventive protocols, quality metrics, and best practice recommendations. A written personalized care plan for preventive services as well as general preventive health recommendations were provided to patient.     Elizah Mierzwa L Dayquan Buys, CMA   10/15/2023   After Visit Summary: (Mail) Due to this being a telephonic visit, the after  visit summary with patients personalized plan was offered to patient via mail   Nurse Notes: Patient is due for a colonoscopy and a eye exam.  He is requesting a referral to see an ophthalmologist.  Patient would like to discuss during his next office visit about getting colonoscopy done.  I did place an order just in case Dr. Jonny Ruiz agrees to patient getting one at his age. He had no other concerns to address today.

## 2023-10-16 ENCOUNTER — Encounter: Payer: Self-pay | Admitting: Oncology

## 2023-10-16 MED ORDER — HYDROXYUREA 500 MG PO CAPS: 1000.0000 mg | ORAL_CAPSULE | Freq: Two times a day (BID) | ORAL | 3 refills | Status: DC

## 2023-10-28 ENCOUNTER — Ambulatory Visit: Payer: 59 | Admitting: Internal Medicine

## 2023-10-28 ENCOUNTER — Encounter: Payer: Self-pay | Admitting: Internal Medicine

## 2023-10-28 VITALS — BP 164/96 | HR 76 | Temp 98.1°F | Ht 69.0 in | Wt 170.0 lb

## 2023-10-28 DIAGNOSIS — E7800 Pure hypercholesterolemia, unspecified: Secondary | ICD-10-CM | POA: Diagnosis not present

## 2023-10-28 DIAGNOSIS — R7302 Impaired glucose tolerance (oral): Secondary | ICD-10-CM | POA: Diagnosis not present

## 2023-10-28 DIAGNOSIS — I10 Essential (primary) hypertension: Secondary | ICD-10-CM | POA: Insufficient documentation

## 2023-10-28 DIAGNOSIS — H538 Other visual disturbances: Secondary | ICD-10-CM | POA: Diagnosis not present

## 2023-10-28 DIAGNOSIS — E559 Vitamin D deficiency, unspecified: Secondary | ICD-10-CM | POA: Diagnosis not present

## 2023-10-28 DIAGNOSIS — Z0001 Encounter for general adult medical examination with abnormal findings: Secondary | ICD-10-CM

## 2023-10-28 DIAGNOSIS — N1831 Chronic kidney disease, stage 3a: Secondary | ICD-10-CM | POA: Diagnosis not present

## 2023-10-28 LAB — TSH: TSH: 2.44 u[IU]/mL (ref 0.35–5.50)

## 2023-10-28 LAB — LIPID PANEL
Cholesterol: 128 mg/dL (ref 0–200)
HDL: 61.3 mg/dL (ref >39.00)
LDL Cholesterol: 45 mg/dL (ref 0–99)
NonHDL: 66.28
Total CHOL/HDL Ratio: 2
Triglycerides: 108 mg/dL (ref 0.0–149.0)
VLDL: 21.6 mg/dL (ref 0.0–40.0)

## 2023-10-28 LAB — CBC WITH DIFFERENTIAL/PLATELET
Basophils Absolute: 0 10*3/uL (ref 0.0–0.1)
Basophils Relative: 1.1 % (ref 0.0–3.0)
Eosinophils Absolute: 0.1 10*3/uL (ref 0.0–0.7)
Eosinophils Relative: 1.6 % (ref 0.0–5.0)
HCT: 40 % (ref 39.0–52.0)
Hemoglobin: 12.4 g/dL — ABNORMAL LOW (ref 13.0–17.0)
Lymphocytes Relative: 23.4 % (ref 12.0–46.0)
Lymphs Abs: 1 10*3/uL (ref 0.7–4.0)
MCHC: 31.1 g/dL (ref 30.0–36.0)
MCV: 100.1 fL — ABNORMAL HIGH (ref 78.0–100.0)
Monocytes Absolute: 0.2 10*3/uL (ref 0.1–1.0)
Monocytes Relative: 5.3 % (ref 3.0–12.0)
Neutro Abs: 3 10*3/uL (ref 1.4–7.7)
Neutrophils Relative %: 68.6 % (ref 43.0–77.0)
Platelets: 309 10*3/uL (ref 150.0–400.0)
RBC: 3.99 Mil/uL — ABNORMAL LOW (ref 4.22–5.81)
RDW: 22.3 % — ABNORMAL HIGH (ref 11.5–15.5)
WBC: 4.3 10*3/uL (ref 4.0–10.5)

## 2023-10-28 LAB — VITAMIN D 25 HYDROXY (VIT D DEFICIENCY, FRACTURES): VITD: 32.02 ng/mL (ref 30.00–100.00)

## 2023-10-28 LAB — HEPATIC FUNCTION PANEL
ALT: 16 U/L (ref 0–53)
AST: 19 U/L (ref 0–37)
Albumin: 4.6 g/dL (ref 3.5–5.2)
Alkaline Phosphatase: 55 U/L (ref 39–117)
Bilirubin, Direct: 0.2 mg/dL (ref 0.0–0.3)
Total Bilirubin: 1 mg/dL (ref 0.2–1.2)
Total Protein: 7.3 g/dL (ref 6.0–8.3)

## 2023-10-28 LAB — BASIC METABOLIC PANEL
BUN: 18 mg/dL (ref 6–23)
CO2: 31 meq/L (ref 19–32)
Calcium: 9.6 mg/dL (ref 8.4–10.5)
Chloride: 103 meq/L (ref 96–112)
Creatinine, Ser: 1.04 mg/dL (ref 0.40–1.50)
GFR: 68.03 mL/min (ref >60.00)
Glucose, Bld: 83 mg/dL (ref 70–99)
Potassium: 4.1 meq/L (ref 3.5–5.1)
Sodium: 140 meq/L (ref 135–145)

## 2023-10-28 MED ORDER — LISINOPRIL 10 MG PO TABS: 10.0000 mg | ORAL_TABLET | Freq: Every day | ORAL | 11 refills | Status: DC

## 2023-10-28 MED ORDER — CARVEDILOL 25 MG PO TABS: 25.0000 mg | ORAL_TABLET | Freq: Two times a day (BID) | ORAL | 11 refills | Status: AC

## 2023-10-28 NOTE — Patient Instructions (Signed)
Ok to increase the coreg to 25 mg twice per day  Ok to increase the lisinopril to 10 mg per day  Please continue all other medications as before, and refills have been done if requested.  Please have the pharmacy call with any other refills you may need.  Please continue your efforts at being more active, low cholesterol diet, and weight control.  You are otherwise up to date with prevention measures today.  Please keep your appointments with your specialists as you may have planned  Please go to the LAB at the blood drawing area for the tests to be done  You will be contacted by phone if any changes need to be made immediately.  Otherwise, you will receive a letter about your results with an explanation, but please check with MyChart first.  Please make an Appointment to return in 4 months, or sooner if needed

## 2023-10-28 NOTE — Progress Notes (Unsigned)
Patient ID: Dillon [name], male   DOB: 1946/06/06, 78 y.o.   MRN: [id number]         Chief Complaint:: wellness exam and htn, blurred vision, hld, hyperglycemia, low vit d, ckd3a       HPI:  Dillon [name] is a 78 y.o. male here for wellness exam; up to date               Also Pt denies chest pain, increased sob or doe, wheezing, orthopnea, PND, increased LE swelling, palpitations, dizziness or syncope.   Pt denies polydipsia, polyuria, or new focal neuro s/s.    Pt denies fever, wt loss, night sweats, loss of appetite, or other constitutional symptoms    Wt Readings from Last 3 Encounters:  10/28/23 170 lb (77.1 kg)  10/15/23 170 lb (77.1 kg)  10/14/23 170 lb 11.2 oz (77.4 kg)   BP Readings from Last 3 Encounters:  10/28/23 (!) 164/96  10/14/23 (!) 181/93  08/11/23 138/62   Immunization History  Administered Date(s) Administered   Fluad Quad(high Dose 65+) 06/19/2022, 08/01/2023   Fluad Trivalent(High Dose 65+) 06/01/2023   H1N1 11/15/2008   Influenza Split 07/28/2012   Influenza Whole 11/29/2009   Influenza, High Dose Seasonal PF 05/18/2015, 05/01/2017, 06/16/2018, 05/06/2019, 05/12/2020, 07/04/2021   Influenza, Seasonal, Injecte, Preservative Fre 06/16/2013   Influenza,inj,Quad PF,6+ Mos 05/06/2014   Influenza-Unspecified 05/12/2017, 05/08/2019   PFIZER Comirnaty(Gray Top)Covid-19 Tri-Sucrose Vaccine 01/10/2021   PFIZER(Purple Top)SARS-COV-2 Vaccination 10/16/2019, 11/05/2019, 06/18/2020, 07/02/2021   Pfizer Covid-19 Vaccine Bivalent Booster 79yrs & up 10/04/2021, 04/07/2022   Pneumococcal Conjugate-13 07/29/2013   Pneumococcal Polysaccharide-23 03/04/2007, 05/04/2015   Respiratory Syncytial Virus Vaccine,Recomb Aduvanted(Arexvy) 06/19/2022   Td 11/15/2008   Tdap 05/14/2019   Zoster Recombinant(Shingrix) 05/12/2020, 07/13/2020   Zoster, Live 03/04/2007   There are no preventive care reminders to display for this patient.     Past Medical History:  Diagnosis  Date   Arthritis    right shoulder   Bladder neck obstruction 04/18/2011   CKD (chronic kidney disease), stage III (HCC) 09/12/2015   ERECTILE DYSFUNCTION 11/15/2008   Essential hypertension, benign 04/28/2013   FATIGUE 11/15/2008   GERD 11/15/2008   History of adenomatous polyp of colon 05/06/2014   2010   HYPERLIPIDEMIA 11/15/2008   Impaired glucose tolerance 04/18/2011   LEG PAIN, LEFT 11/05/2010   LVH (left ventricular hypertrophy) 04/27/2012   MIGRAINE, COMMON 08/06/2010   Nocturia 12/19/2008   Polycythemia 04/27/2012   Swelling of limb 11/05/2010   THROMBOCYTOSIS 12/19/2008   myleoproliferative disorder   Past Surgical History:  Procedure Laterality Date   APPENDECTOMY     CARDIAC CATHETERIZATION N/A 09/05/2015   Procedure: Left Heart Cath and Coronary Angiography;  Surgeon: Lennette Bihari, MD;  Location: [facility] INVASIVE CV LAB;  Service: Cardiovascular;  Laterality: N/A;   CIRCUMCISION     30+yrs ago   COLONOSCOPY     POLYPECTOMY     s/p esophageal dilation     TONSILLECTOMY     UPPER GASTROINTESTINAL ENDOSCOPY      reports that he quit smoking about 53 years ago. His smoking use included cigarettes. He started smoking about 65 years ago. He has a 6 pack-year smoking history. He has never used smokeless tobacco. He reports current alcohol use. He reports that he does not use drugs. family history includes Cancer in his father; Hypertension in his brother, mother, and sister; Prostate cancer in his father. No Known Allergies Current Outpatient Medications on File Prior to Visit  Medication Sig Dispense Refill   furosemide (LASIX) 20 MG tablet TAKE 1 TABLET BY MOUTH DAILY AS NEEDED FOR EDEMA.PLEASE CALL TO SCHEDULE OVERDUE APPT WITH DR KELLEY 30 tablet 0   hydroxyurea (HYDREA) 500 MG capsule Take 2 capsules (1,000 mg total) by mouth 2 (two) times daily. 360 capsule 3   lovastatin (MEVACOR) 40 MG tablet TAKE 1 TABLET BY MOUTH EVERY DAY 90 tablet 3   tadalafil (CIALIS) 20 MG tablet Take 0.5-1  tablets (10-20 mg total) by mouth every other day as needed for erectile dysfunction. 10 tablet 11   tamsulosin (FLOMAX) 0.4 MG CAPS capsule TAKE 1 CAPSULE BY MOUTH EVERY DAY 90 capsule 1   tiZANidine (ZANAFLEX) 2 MG tablet Take 1 tablet (2 mg total) by mouth every 6 (six) hours as needed for muscle spasms. 30 tablet 0   Cholecalciferol 50 MCG (2000 UT) TABS 1 tab by mouth once daily (Patient not taking: Reported on 10/28/2023) 30 tablet 99   fluticasone (FLONASE) 50 MCG/ACT nasal spray Place 2 sprays into both nostrils daily. (Patient not taking: Reported on 10/28/2023) 16 g 6   predniSONE (STERAPRED UNI-PAK 21 TAB) 10 MG (21) TBPK tablet As directed x 6 days (Patient not taking: Reported on 10/28/2023) 21 tablet 0   No current facility-administered medications on file prior to visit.        ROS:  All others reviewed and negative.  Objective        PE:  BP (!) 164/96 (BP Location: Right Arm, Patient Position: Sitting, Cuff Size: Normal)   Pulse 76   Temp 98.1 F (36.7 C) (Oral)   Ht 5\' 9"  (1.753 m)   Wt 170 lb (77.1 kg)   SpO2 99%   BMI 25.10 kg/m                 Constitutional: Pt appears in NAD               HENT: Head: NCAT.                Right Ear: External ear normal.                 Left Ear: External ear normal.                Eyes: . Pupils are equal, round, and reactive to light. Conjunctivae and EOM are normal               Nose: without d/c or deformity               Neck: Neck supple. Gross normal ROM               Cardiovascular: Normal rate and regular rhythm.                 Pulmonary/Chest: Effort normal and breath sounds without rales or wheezing.                Abd:  Soft, NT, ND, + BS, no organomegaly               Neurological: Pt is alert. At baseline orientation, motor grossly intact               Skin: Skin is warm. No rashes, no other new lesions, LE edema - none               Psychiatric: Pt behavior is normal without agitation   Micro: none  Cardiac  tracings I  have personally interpreted today:  none  Pertinent Radiological findings (summarize): none   Lab Results  Component Value Date   WBC 4.3 10/28/2023   HGB 12.4 (L) 10/28/2023   HCT 40.0 10/28/2023   PLT 309.0 10/28/2023   GLUCOSE 83 10/28/2023   CHOL 128 10/28/2023   TRIG 108.0 10/28/2023   HDL 61.30 10/28/2023   LDLDIRECT 57.0 12/15/2017   LDLCALC 45 10/28/2023   ALT 16 10/28/2023   AST 19 10/28/2023   NA 140 10/28/2023   K 4.1 10/28/2023   CL 103 10/28/2023   CREATININE 1.04 10/28/2023   BUN 18 10/28/2023   CO2 31 10/28/2023   TSH 2.44 10/28/2023   PSA 2.31 01/24/2022   INR 1.32 09/04/2015   HGBA1C 5.4 10/28/2023   Assessment/Plan:  Dillon [name] is a 78 y.o. Black or African American [2] male with  has a past medical history of Arthritis, Bladder neck obstruction (04/18/2011), CKD (chronic kidney disease), stage III (HCC) (09/12/2015), ERECTILE DYSFUNCTION (11/15/2008), Essential hypertension, benign (04/28/2013), FATIGUE (11/15/2008), GERD (11/15/2008), History of adenomatous polyp of colon (05/06/2014), HYPERLIPIDEMIA (11/15/2008), Impaired glucose tolerance (04/18/2011), LEG PAIN, LEFT (11/05/2010), LVH (left ventricular hypertrophy) (04/27/2012), MIGRAINE, COMMON (08/06/2010), Nocturia (12/19/2008), Polycythemia (04/27/2012), Swelling of limb (11/05/2010), and THROMBOCYTOSIS (12/19/2008).  Encounter for well adult exam with abnormal findings Age and sex appropriate education and counseling updated with regular exercise and diet Referrals for preventative services - none needed Immunizations addressed - none needed Smoking counseling  - none needed Evidence for depression or other mood disorder - none significant Most recent labs reviewed. I have personally reviewed and have noted: 1) the patient's medical and social history 2) The patient's current medications and supplements 3) The patient's height, weight, and BMI have been recorded in the chart   CKD (chronic kidney  disease), stage III Lab Results  Component Value Date   CREATININE 1.04 10/28/2023   Stable overall, cont to avoid nephrotoxins   Essential hypertension, benign BP Readings from Last 3 Encounters:  10/28/23 (!) 164/96  10/14/23 (!) 181/93  08/11/23 138/62   Uncontrolled, pt for increased coreg 25 bid, increased lisinopril 10 qd   Hyperlipidemia Lab Results  Component Value Date   LDLCALC 45 10/28/2023   Stable, pt to continue current statin lovastatin 40 qd   Impaired glucose tolerance Lab Results  Component Value Date   HGBA1C 5.4 10/28/2023   Stable, pt to continue current medical treatment   - diet, wt control   Vitamin D deficiency Last vitamin D Lab Results  Component Value Date   VD25OH 32.02 10/28/2023   Low, to start oral replacement   Blurred vision Also for refer optho per pt reuest  Followup: Return in about 4 months (around 02/25/2024).  Oliver Barre, MD 10/30/2023 8:48 PM [facility]

## 2023-10-29 ENCOUNTER — Encounter: Payer: Self-pay | Admitting: Internal Medicine

## 2023-10-29 LAB — URINALYSIS, ROUTINE W REFLEX MICROSCOPIC
Bilirubin Urine: NEGATIVE
Hgb urine dipstick: NEGATIVE
Ketones, ur: NEGATIVE
Leukocytes,Ua: NEGATIVE
Nitrite: NEGATIVE
RBC / HPF: NONE SEEN (ref 0–?)
Specific Gravity, Urine: 1.02 (ref 1.000–1.030)
Total Protein, Urine: NEGATIVE
Urine Glucose: NEGATIVE
Urobilinogen, UA: 0.2 (ref 0.0–1.0)
pH: 6 (ref 5.0–8.0)

## 2023-10-29 LAB — HEMOGLOBIN A1C: Hgb A1c MFr Bld: 5.4 % (ref 4.6–6.5)

## 2023-10-30 ENCOUNTER — Encounter: Payer: Self-pay | Admitting: Internal Medicine

## 2023-10-30 DIAGNOSIS — H538 Other visual disturbances: Secondary | ICD-10-CM | POA: Insufficient documentation

## 2023-10-30 NOTE — Assessment & Plan Note (Signed)

## 2023-10-30 NOTE — Assessment & Plan Note (Signed)
Lab Results  Component Value Date   HGBA1C 5.4 10/28/2023   Stable, pt to continue current medical treatment   - diet, wt control

## 2023-10-30 NOTE — Assessment & Plan Note (Signed)
Also for refer optho per pt reuest

## 2023-10-30 NOTE — Assessment & Plan Note (Signed)
Lab Results  Component Value Date   CREATININE 1.04 10/28/2023   Stable overall, cont to avoid nephrotoxins

## 2023-10-30 NOTE — Assessment & Plan Note (Signed)
BP Readings from Last 3 Encounters:  10/28/23 (!) 164/96  10/14/23 (!) 181/93  08/11/23 138/62   Uncontrolled, pt for increased coreg 25 bid, increased lisinopril 10 qd

## 2023-10-30 NOTE — Assessment & Plan Note (Signed)
Lab Results  Component Value Date   LDLCALC 45 10/28/2023   Stable, pt to continue current statin lovastatin 40 qd

## 2023-10-30 NOTE — Assessment & Plan Note (Signed)
Last vitamin D Lab Results  Component Value Date   VD25OH 32.02 10/28/2023   Low, to start oral replacement

## 2023-10-31 ENCOUNTER — Ambulatory Visit (HOSPITAL_COMMUNITY)
Admission: EM | Admit: 2023-10-31 | Discharge: 2023-10-31 | Disposition: A | Discharge status: Home / Self Care | Payer: Self-pay | Attending: Family Medicine | Admitting: Family Medicine

## 2023-10-31 ENCOUNTER — Encounter (HOSPITAL_COMMUNITY): Payer: Self-pay

## 2023-10-31 DIAGNOSIS — M542 Cervicalgia: Secondary | ICD-10-CM

## 2023-10-31 DIAGNOSIS — M25561 Pain in right knee: Secondary | ICD-10-CM

## 2023-10-31 DIAGNOSIS — M25512 Pain in left shoulder: Secondary | ICD-10-CM

## 2023-10-31 MED ORDER — LIDOCAINE 5 % EX PTCH: 1.0000 | MEDICATED_PATCH | CUTANEOUS | 0 refills | Status: DC

## 2023-10-31 MED ORDER — BACLOFEN 10 MG PO TABS: 10.0000 mg | ORAL_TABLET | Freq: Two times a day (BID) | ORAL | 0 refills | Status: AC
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
## 2023-10-31 NOTE — Discharge Instructions (Signed)
You can apply lidocaine patches as needed to help reduce pain. I have prescribed baclofen for you to take as needed for muscle pain. This can make you drowsy so do not work or drive while taking. Otherwise I recommend taking Tylenol as needed for pain. You can also try applying heat as needed to help relieve pain. Follow-up with [facility] if pain persists. Return here as needed.

## 2023-10-31 NOTE — ED Notes (Signed)
Pt fell due to slipping on ice a few weeks ago. Denied hitting his head, losing consciousness, or any injury.

## 2023-11-05 ENCOUNTER — Other Ambulatory Visit: Payer: Self-pay

## 2023-11-05 DIAGNOSIS — D75839 Thrombocytosis, unspecified: Secondary | ICD-10-CM

## 2023-11-05 MED ORDER — HYDROXYUREA 500 MG PO CAPS: 1000.0000 mg | ORAL_CAPSULE | Freq: Two times a day (BID) | ORAL | 3 refills | Status: DC

## 2023-11-12 ENCOUNTER — Encounter: Payer: Self-pay | Admitting: Oncology

## 2023-11-25 ENCOUNTER — Inpatient Hospital Stay: Payer: Medicare (Managed Care) | Attending: Hematology and Oncology

## 2023-11-25 DIAGNOSIS — Z8042 Family history of malignant neoplasm of prostate: Secondary | ICD-10-CM | POA: Diagnosis not present

## 2023-11-25 DIAGNOSIS — Z87891 Personal history of nicotine dependence: Secondary | ICD-10-CM | POA: Insufficient documentation

## 2023-11-25 DIAGNOSIS — Z79899 Other long term (current) drug therapy: Secondary | ICD-10-CM | POA: Insufficient documentation

## 2023-11-25 DIAGNOSIS — D473 Essential (hemorrhagic) thrombocythemia: Secondary | ICD-10-CM

## 2023-11-25 LAB — CBC WITH DIFFERENTIAL (CANCER CENTER ONLY)
Abs Immature Granulocytes: 0.07 10*3/uL (ref 0.00–0.07)
Basophils Absolute: 0.1 10*3/uL (ref 0.0–0.1)
Basophils Relative: 1 %
Eosinophils Absolute: 0.1 10*3/uL (ref 0.0–0.5)
Eosinophils Relative: 1 %
HCT: 33 % — ABNORMAL LOW (ref 39.0–52.0)
Hemoglobin: 10.1 g/dL — ABNORMAL LOW (ref 13.0–17.0)
Immature Granulocytes: 1 %
Lymphocytes Relative: 21 %
Lymphs Abs: 2 10*3/uL (ref 0.7–4.0)
MCH: 31.3 pg (ref 26.0–34.0)
MCHC: 30.6 g/dL (ref 30.0–36.0)
MCV: 102.2 fL — ABNORMAL HIGH (ref 80.0–100.0)
Monocytes Absolute: 0.5 10*3/uL (ref 0.1–1.0)
Monocytes Relative: 6 %
Neutro Abs: 6.5 10*3/uL (ref 1.7–7.7)
Neutrophils Relative %: 70 %
Platelet Count: 622 10*3/uL — ABNORMAL HIGH (ref 150–400)
RBC: 3.23 MIL/uL — ABNORMAL LOW (ref 4.22–5.81)
RDW: 21 % — ABNORMAL HIGH (ref 11.5–15.5)
WBC Count: 9.2 10*3/uL (ref 4.0–10.5)
nRBC: 0 % (ref 0.0–0.2)

## 2023-11-25 LAB — CMP (CANCER CENTER ONLY)
ALT: 14 U/L (ref 0–44)
AST: 16 U/L (ref 15–41)
Albumin: 4 g/dL (ref 3.5–5.0)
Alkaline Phosphatase: 48 U/L (ref 38–126)
Anion gap: 2 — ABNORMAL LOW (ref 5–15)
BUN: 15 mg/dL (ref 8–23)
CO2: 30 mmol/L (ref 22–32)
Calcium: 8.7 mg/dL — ABNORMAL LOW (ref 8.9–10.3)
Chloride: 108 mmol/L (ref 98–111)
Creatinine: 1.24 mg/dL (ref 0.61–1.24)
GFR, Estimated: 59 mL/min — ABNORMAL LOW (ref >60)
Glucose, Bld: 99 mg/dL (ref 70–99)
Potassium: 4.2 mmol/L (ref 3.5–5.1)
Sodium: 140 mmol/L (ref 135–145)
Total Bilirubin: 0.4 mg/dL (ref 0.0–1.2)
Total Protein: 6.1 g/dL — ABNORMAL LOW (ref 6.5–8.1)

## 2023-11-26 LAB — FERRITIN: Ferritin: 111 ng/mL (ref 24–336)

## 2023-12-08 ENCOUNTER — Other Ambulatory Visit: Payer: Self-pay | Admitting: Physician Assistant

## 2023-12-08 DIAGNOSIS — D473 Essential (hemorrhagic) thrombocythemia: Secondary | ICD-10-CM

## 2023-12-10 ENCOUNTER — Inpatient Hospital Stay (HOSPITAL_BASED_OUTPATIENT_CLINIC_OR_DEPARTMENT_OTHER): Payer: Medicare (Managed Care) | Admitting: Physician Assistant

## 2023-12-10 ENCOUNTER — Inpatient Hospital Stay: Payer: Medicare (Managed Care)

## 2023-12-10 VITALS — BP 157/79 | HR 66 | Temp 97.4°F | Resp 18 | Ht 69.0 in | Wt 166.5 lb

## 2023-12-10 DIAGNOSIS — D75839 Thrombocytosis, unspecified: Secondary | ICD-10-CM | POA: Diagnosis not present

## 2023-12-10 DIAGNOSIS — D473 Essential (hemorrhagic) thrombocythemia: Secondary | ICD-10-CM

## 2023-12-10 LAB — CBC WITH DIFFERENTIAL (CANCER CENTER ONLY)
Abs Immature Granulocytes: 0.1 10*3/uL — ABNORMAL HIGH (ref 0.00–0.07)
Basophils Absolute: 0.1 10*3/uL (ref 0.0–0.1)
Basophils Relative: 1 %
Eosinophils Absolute: 0.1 10*3/uL (ref 0.0–0.5)
Eosinophils Relative: 1 %
HCT: 31.8 % — ABNORMAL LOW (ref 39.0–52.0)
Hemoglobin: 10 g/dL — ABNORMAL LOW (ref 13.0–17.0)
Immature Granulocytes: 2 %
Lymphocytes Relative: 19 %
Lymphs Abs: 1.2 10*3/uL (ref 0.7–4.0)
MCH: 33 pg (ref 26.0–34.0)
MCHC: 31.4 g/dL (ref 30.0–36.0)
MCV: 105 fL — ABNORMAL HIGH (ref 80.0–100.0)
Monocytes Absolute: 0.3 10*3/uL (ref 0.1–1.0)
Monocytes Relative: 5 %
Neutro Abs: 4.7 10*3/uL (ref 1.7–7.7)
Neutrophils Relative %: 72 %
Platelet Count: 223 10*3/uL (ref 150–400)
RBC: 3.03 MIL/uL — ABNORMAL LOW (ref 4.22–5.81)
RDW: 20.8 % — ABNORMAL HIGH (ref 11.5–15.5)
WBC Count: 6.5 10*3/uL (ref 4.0–10.5)
nRBC: 0 % (ref 0.0–0.2)

## 2023-12-10 LAB — CMP (CANCER CENTER ONLY)
ALT: 17 U/L (ref 0–44)
AST: 17 U/L (ref 15–41)
Albumin: 4.4 g/dL (ref 3.5–5.0)
Alkaline Phosphatase: 54 U/L (ref 38–126)
Anion gap: 5 (ref 5–15)
BUN: 23 mg/dL (ref 8–23)
CO2: 28 mmol/L (ref 22–32)
Calcium: 9.6 mg/dL (ref 8.9–10.3)
Chloride: 107 mmol/L (ref 98–111)
Creatinine: 1.26 mg/dL — ABNORMAL HIGH (ref 0.61–1.24)
GFR, Estimated: 58 mL/min — ABNORMAL LOW (ref >60)
Glucose, Bld: 95 mg/dL (ref 70–99)
Potassium: 4.1 mmol/L (ref 3.5–5.1)
Sodium: 140 mmol/L (ref 135–145)
Total Bilirubin: 0.6 mg/dL (ref 0.0–1.2)
Total Protein: 7 g/dL (ref 6.5–8.1)

## 2023-12-10 LAB — FERRITIN: Ferritin: 166 ng/mL (ref 24–336)

## 2023-12-10 MED ORDER — HYDROXYUREA 500 MG PO CAPS: 1000.0000 mg | ORAL_CAPSULE | Freq: Two times a day (BID) | ORAL | 3 refills | Status: DC

## 2023-12-10 NOTE — Progress Notes (Signed)
 [facility] Telephone:([phone]) [phone]   [phone]) [phone]  PROGRESS NOTE  Patient Care Team: Corwin Levins, MD as PCP - General Louis Meckel, MD (Inactive) as Consulting Physician (Gastroenterology)  CHIEF COMPLAINTS/PURPOSE OF CONSULTATION:  JAK2 positive essential thrombocythemia.   CURRENT TREATMENT: Hydrea  1000 mg twice daily  HISTORY OF PRESENTING ILLNESS:  Discussed the use of AI scribe software for clinical note transcription with the patient, who gave verbal consent to proceed.  History of Present Illness The patient, with a history of  JAK2 positive essential thrombocythemia managed with Hydrea, presents with dizziness that started after his ninth COVID-19 vaccination. He reports that the dizziness is improving. He has been receiving the Pfizer vaccine, but suspects that he may have received a different brand for his most recent dose. He denies any other symptoms such as shortness of breath, fatigue, or flu-like symptoms.  He has been compliant with his Hydrea medication, taking two tablets twice a day without skipping any doses. He reports occasional spotting of blood when blowing his nose, but denies any active nosebleeds or other bleeding. He has not been trying to lose weight, but has lost about four pounds in the last month. He attributes this to staying active and recently retiring.  He also reports taking a daily baby aspirin. He denies fevers, chills, sweats, shortness of breath, chest pain or cough. He has no other complaints. Rest of the 10 point ROS is below.  MEDICAL HISTORY:  Past Medical History:  Diagnosis Date   Arthritis    right shoulder   Bladder neck obstruction 04/18/2011   CKD (chronic kidney disease), stage III (HCC) 09/12/2015   ERECTILE DYSFUNCTION 11/15/2008   Essential hypertension, benign 04/28/2013   FATIGUE 11/15/2008   GERD 11/15/2008   History of adenomatous polyp of colon 05/06/2014   2010   HYPERLIPIDEMIA 11/15/2008   Impaired  glucose tolerance 04/18/2011   LEG PAIN, LEFT 11/05/2010   LVH (left ventricular hypertrophy) 04/27/2012   MIGRAINE, COMMON 08/06/2010   Nocturia 12/19/2008   Polycythemia 04/27/2012   Swelling of limb 11/05/2010   THROMBOCYTOSIS 12/19/2008   myleoproliferative disorder    SURGICAL HISTORY: Past Surgical History:  Procedure Laterality Date   APPENDECTOMY     CARDIAC CATHETERIZATION N/A 09/05/2015   Procedure: Left Heart Cath and Coronary Angiography;  Surgeon: Lennette Bihari, MD;  Location[facility];  Service: Cardiovascular;  Laterality: N/A;   CIRCUMCISION     30+yrs ago   COLONOSCOPY     POLYPECTOMY     s/p esophageal dilation     TONSILLECTOMY     UPPER GASTROINTESTINAL ENDOSCOPY      SOCIAL HISTORY: 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  Tobacco Use   Smoking status: Former    Current packs/day: 0.00    Average packs/day: 0.5 packs/day for 12.0 years (6.0 ttl pk-yrs)    Types: Cigarettes    Start date: 09/16/1958    Quit date: 09/16/1970    Years since quitting: 53.2   Smokeless tobacco: Never   Tobacco comments:    hasn't smoked in 46 years;   Vaping Use   Vaping status: Never Used  Substance and Sexual Activity   Alcohol use: Yes  Alcohol/week: 0.0 standard drinks of alcohol    Comment: occasional;    Drug use: No   Sexual activity: Not on file  Other Topics Concern   Not on file  Social History Narrative   Lives with sister.   Social Drivers of Health   Financial Resource Strain: Medium Risk (10/15/2023)   Overall Financial Resource Strain (CARDIA)    Difficulty of Paying Living Expenses: Somewhat hard  Food Insecurity: Food Insecurity Present (10/15/2023)   Hunger Vital Sign    Worried About Running Out of Food  in the Last Year: Sometimes true    Ran Out of Food in the Last Year: Sometimes true  Transportation Needs: No Transportation Needs (10/15/2023)   PRAPARE - Administrator, Civil Service (Medical): No    Lack of Transportation (Non-Medical): No  Physical Activity: Inactive (10/15/2023)   Exercise Vital Sign    Days of Exercise per Week: 0 days    Minutes of Exercise per Session: 0 min  Stress: No Stress Concern Present (10/15/2023)   [employer] of Occupational Health - Occupational Stress Questionnaire    Feeling of Stress : Not at all  Social Connections: Moderately Integrated (10/15/2023)   Social Connection and Isolation Panel [NHANES]    Frequency of Communication with Friends and Family: Three times a week    Frequency of Social Gatherings with Friends and Family: Once a week    Attends Religious Services: More than 4 times per year    Active Member of [employer] or Organizations: Yes    Attends Banker Meetings: Never    Marital Status: Separated  Intimate Partner Violence: Not At Risk (10/15/2023)   Humiliation, Afraid, Rape, and Kick questionnaire    Fear of Current or Ex-Partner: No    Emotionally Abused: No    Physically Abused: No    Sexually Abused: No    FAMILY HISTORY: Family History  Problem Relation Age of Onset   Hypertension Mother    Cancer Father        prostate cancer   Prostate cancer Father    Hypertension Sister    Hypertension Brother    Colon cancer Neg Hx    Rectal cancer Neg Hx    Stomach cancer Neg Hx    Esophageal cancer Neg Hx    Pancreatic cancer Neg Hx     ALLERGIES:  has no known allergies.  MEDICATIONS:  Current Outpatient Medications  Medication Sig Dispense Refill   baclofen (LIORESAL) 10 MG tablet Take 1 tablet (10 mg total) by mouth 2 (two) times daily. 30 each 0   carvedilol (COREG) 25 MG tablet Take 1 tablet (25 mg total) by mouth 2 (two) times daily. 60 tablet 11   furosemide (LASIX) 20 MG tablet TAKE 1  TABLET BY MOUTH DAILY AS NEEDED FOR EDEMA.PLEASE CALL TO SCHEDULE OVERDUE APPT WITH DR KELLEY 30 tablet 0   lidocaine (LIDODERM) 5 % Place 1 patch onto the skin daily. Remove & Discard patch within 12 hours or as directed by MD 30 patch 0   lisinopril (ZESTRIL) 10 MG tablet Take 1 tablet (10 mg total) by mouth daily. 30 tablet 11   lovastatin (MEVACOR) 40 MG tablet TAKE 1 TABLET BY MOUTH EVERY DAY 90 tablet 3   tadalafil (CIALIS) 20 MG tablet Take 0.5-1 tablets (10-20 mg total) by mouth every other day as needed for erectile dysfunction. 10 tablet 11   tamsulosin (FLOMAX) 0.4 MG CAPS capsule TAKE 1 CAPSULE BY MOUTH  EVERY DAY 90 capsule 1   tiZANidine (ZANAFLEX) 2 MG tablet Take 1 tablet (2 mg total) by mouth every 6 (six) hours as needed for muscle spasms. 30 tablet 0   Cholecalciferol 50 MCG (2000 UT) TABS 1 tab by mouth once daily (Patient not taking: Reported on 08/11/2023) 30 tablet 99   fluticasone (FLONASE) 50 MCG/ACT nasal spray Place 2 sprays into both nostrils daily. (Patient not taking: Reported on 08/11/2023) 16 g 6   hydroxyurea (HYDREA) 500 MG capsule Take 2 capsules (1,000 mg total) by mouth 2 (two) times daily. 120 capsule 3   predniSONE (STERAPRED UNI-PAK 21 TAB) 10 MG (21) TBPK tablet As directed x 6 days (Patient not taking: Reported on 10/15/2023) 21 tablet 0   No current facility-administered medications for this visit.    REVIEW OF SYSTEMS:   Constitutional: ( - ) fevers, ( - )  chills , ( - ) night sweats Eyes: ( - ) blurriness of vision, ( - ) double vision, ( - ) watery eyes Ears, nose, mouth, throat, and face: ( - ) mucositis, ( - ) sore throat Respiratory: ( - ) cough, ( - ) dyspnea, ( - ) wheezes Cardiovascular: ( - ) palpitation, ( - ) chest discomfort, ( - ) lower extremity swelling Gastrointestinal:  ( - ) nausea, ( - ) heartburn, ( - ) change in bowel habits Skin: ( - ) abnormal skin rashes Lymphatics: ( - ) new lymphadenopathy, ( - ) easy bruising Neurological:  ( - ) numbness, ( - ) tingling, ( - ) new weaknesses Behavioral/Psych: ( - ) mood change, ( - ) new changes  All other systems were reviewed with the patient and are negative.  PHYSICAL EXAMINATION: ECOG PERFORMANCE STATUS: 0 - Asymptomatic  Vitals:   12/10/23 1013  BP: (!) 157/79  Pulse: 66  Resp: 18  Temp: (!) 97.4 F (36.3 C)  SpO2: 100%   Filed Weights   12/10/23 1013  Weight: 166 lb 8 oz (75.5 kg)    GENERAL: well appearing male in NAD  SKIN: skin color, texture, turgor are normal, no rashes or significant lesions EYES: conjunctiva are pink and non-injected, sclera clear LUNGS: clear to auscultation and percussion with normal breathing effort HEART: regular rate & rhythm and no murmurs and no lower extremity edema Musculoskeletal: no cyanosis of digits and no clubbing  PSYCH: alert & oriented x 3, fluent speech NEURO: no focal motor/sensory deficits  LABORATORY DATA:  I have reviewed the data as listed    Latest Ref Rng & Units 12/10/2023    9:59 AM 11/25/2023    2:20 PM 10/28/2023    3:14 PM  CBC  WBC 4.0 - 10.5 K/uL 6.5  9.2  4.3   Hemoglobin 13.0 - 17.0 g/dL 09.8  11.9  14.7   Hematocrit 39.0 - 52.0 % 31.8  33.0  40.0   Platelets 150 - 400 K/uL 223  622  309.0        Latest Ref Rng & Units 12/10/2023    9:59 AM 11/25/2023    2:20 PM 10/28/2023    3:14 PM  CMP  Glucose 70 - 99 mg/dL 95  99  83   BUN 8 - 23 mg/dL 23  15  18    Creatinine 0.61 - 1.24 mg/dL 8.29  5.62  1.30   Sodium 135 - 145 mmol/L 140  140  140   Potassium 3.5 - 5.1 mmol/L 4.1  4.2  4.1   Chloride  98 - 111 mmol/L 107  108  103   CO2 22 - 32 mmol/L 28  30  31    Calcium 8.9 - 10.3 mg/dL 9.6  8.7  9.6   Total Protein 6.5 - 8.1 g/dL 7.0  6.1  7.3   Total Bilirubin 0.0 - 1.2 mg/dL 0.6  0.4  1.0   Alkaline Phos 38 - 126 U/L 54  48  55   AST 15 - 41 U/L 17  16  19    ALT 0 - 44 U/L 17  14  16       PATHOLOGY: Collected Date: 11/20/2009   Received Date: 11/20/2009    FINAL DIAGNOSIS    Bone  Marrow, Aspirate,Biopsy, and Clot, left iliac crest :   - HYPERCELLULAR MARROW WITH FEATURES OF CHRONIC MYELOPROLIFERATIVE DISORDER.   - SEE COMMENT.   PERIPHERAL BLOOD:   - THROMBOCYTOSIS.   ASSESSMENT & PLAN Dillon [name] is a 78 y.o. male who presents for a follow up for JAK2 positive essential thrombocythemia.   #ZOX0 Essential Thrombocythemia --Labs today reviewed with patient. WBC 6.5, Hgb 10.0, MCV 31.8, Plt 223K. Creatinine mildly elevated. LFTs normal.  --Currently taking ASA 81 mg daily --Currently taking Hydrea 1000 mg BID. Recommend to continue current dose. Refill sent.  --RTC in 6 weeks for lab check and 12 weeks for labs/follow up.     Orders Placed This Encounter  Procedures   CBC with Differential (Cancer Center Only)    Standing Status:   Future    Expected Date:   01/21/2024    Expiration Date:   12/09/2024   CMP (Cancer Center only)    Standing Status:   Future    Expected Date:   01/21/2024    Expiration Date:   12/09/2024   Ferritin    Standing Status:   Future    Expected Date:   01/21/2024    Expiration Date:   12/09/2024    All questions were answered. The patient knows to call the clinic with any problems, questions or concerns.  I have spent a total of 30 minutes minutes of face-to-face and non-face-to-face time, preparing to see the patient, performing a medically appropriate examination, counseling and educating the patient, ordering medications, documenting clinical information in the electronic health record, and care coordination.   Georga Kaufmann, PA-C Department of Hematology/Oncology [facility] Phone: [phone]

## 2023-12-15 ENCOUNTER — Encounter: Payer: Self-pay | Admitting: Oncology

## 2024-01-07 ENCOUNTER — Ambulatory Visit: Payer: Medicare (Managed Care) | Admitting: Hematology and Oncology

## 2024-01-07 ENCOUNTER — Other Ambulatory Visit: Payer: Medicare (Managed Care)

## 2024-01-21 ENCOUNTER — Inpatient Hospital Stay: Payer: Medicare (Managed Care) | Attending: Hematology and Oncology

## 2024-01-21 ENCOUNTER — Encounter: Payer: Self-pay | Admitting: Oncology

## 2024-02-12 ENCOUNTER — Other Ambulatory Visit: Payer: Self-pay | Admitting: Internal Medicine

## 2024-02-12 ENCOUNTER — Other Ambulatory Visit: Payer: Self-pay

## 2024-03-04 ENCOUNTER — Inpatient Hospital Stay (HOSPITAL_BASED_OUTPATIENT_CLINIC_OR_DEPARTMENT_OTHER): Payer: Medicare (Managed Care) | Admitting: Hematology and Oncology

## 2024-03-04 ENCOUNTER — Inpatient Hospital Stay: Payer: Medicare (Managed Care) | Attending: Hematology and Oncology

## 2024-03-04 ENCOUNTER — Other Ambulatory Visit: Payer: Self-pay | Admitting: *Deleted

## 2024-03-04 VITALS — BP 156/69 | HR 60 | Temp 98.0°F | Resp 16 | Ht 69.0 in | Wt 168.2 lb

## 2024-03-04 DIAGNOSIS — D473 Essential (hemorrhagic) thrombocythemia: Secondary | ICD-10-CM

## 2024-03-04 DIAGNOSIS — D7589 Other specified diseases of blood and blood-forming organs: Secondary | ICD-10-CM | POA: Diagnosis not present

## 2024-03-04 DIAGNOSIS — D649 Anemia, unspecified: Secondary | ICD-10-CM | POA: Insufficient documentation

## 2024-03-04 DIAGNOSIS — Z79899 Other long term (current) drug therapy: Secondary | ICD-10-CM | POA: Insufficient documentation

## 2024-03-04 DIAGNOSIS — D75839 Thrombocytosis, unspecified: Secondary | ICD-10-CM

## 2024-03-04 DIAGNOSIS — Z87891 Personal history of nicotine dependence: Secondary | ICD-10-CM | POA: Insufficient documentation

## 2024-03-04 DIAGNOSIS — Z8042 Family history of malignant neoplasm of prostate: Secondary | ICD-10-CM | POA: Insufficient documentation

## 2024-03-04 LAB — CMP (CANCER CENTER ONLY)
ALT: 11 U/L (ref 0–44)
AST: 15 U/L (ref 15–41)
Albumin: 4.5 g/dL (ref 3.5–5.0)
Alkaline Phosphatase: 54 U/L (ref 38–126)
Anion gap: 6 (ref 5–15)
BUN: 34 mg/dL — ABNORMAL HIGH (ref 8–23)
CO2: 27 mmol/L (ref 22–32)
Calcium: 9.6 mg/dL (ref 8.9–10.3)
Chloride: 107 mmol/L (ref 98–111)
Creatinine: 1.59 mg/dL — ABNORMAL HIGH (ref 0.61–1.24)
GFR, Estimated: 44 mL/min — ABNORMAL LOW (ref >60)
Glucose, Bld: 92 mg/dL (ref 70–99)
Potassium: 5.1 mmol/L (ref 3.5–5.1)
Sodium: 140 mmol/L (ref 135–145)
Total Bilirubin: 0.4 mg/dL (ref 0.0–1.2)
Total Protein: 7.1 g/dL (ref 6.5–8.1)

## 2024-03-04 LAB — CBC WITH DIFFERENTIAL (CANCER CENTER ONLY)
Abs Immature Granulocytes: 0.02 10*3/uL (ref 0.00–0.07)
Basophils Absolute: 0 10*3/uL (ref 0.0–0.1)
Basophils Relative: 1 %
Eosinophils Absolute: 0.1 10*3/uL (ref 0.0–0.5)
Eosinophils Relative: 2 %
HCT: 29.2 % — ABNORMAL LOW (ref 39.0–52.0)
Hemoglobin: 9.7 g/dL — ABNORMAL LOW (ref 13.0–17.0)
Immature Granulocytes: 0 %
Lymphocytes Relative: 27 %
Lymphs Abs: 1.3 10*3/uL (ref 0.7–4.0)
MCH: 38.2 pg — ABNORMAL HIGH (ref 26.0–34.0)
MCHC: 33.2 g/dL (ref 30.0–36.0)
MCV: 115 fL — ABNORMAL HIGH (ref 80.0–100.0)
Monocytes Absolute: 0.5 10*3/uL (ref 0.1–1.0)
Monocytes Relative: 9 %
Neutro Abs: 3 10*3/uL (ref 1.7–7.7)
Neutrophils Relative %: 61 %
Platelet Count: 398 10*3/uL (ref 150–400)
RBC: 2.54 MIL/uL — ABNORMAL LOW (ref 4.22–5.81)
RDW: 14.3 % (ref 11.5–15.5)
WBC Count: 4.9 10*3/uL (ref 4.0–10.5)
nRBC: 0 % (ref 0.0–0.2)

## 2024-03-04 LAB — FERRITIN: Ferritin: 161 ng/mL (ref 24–336)

## 2024-03-04 MED ORDER — HYDROXYUREA 500 MG PO CAPS: 1000.0000 mg | ORAL_CAPSULE | Freq: Two times a day (BID) | ORAL | 3 refills | Status: AC

## 2024-03-04 NOTE — Progress Notes (Signed)
 [facility] Telephone:(336) [phone]   Fax:([phone]  PROGRESS NOTE  Patient Care Team: Roslyn Coombe, MD as PCP - General Claudette Cue, MD (Inactive) as Consulting Physician (Gastroenterology)  CHIEF COMPLAINTS/PURPOSE OF CONSULTATION:  JAK2 positive essential thrombocythemia.   CURRENT TREATMENT: Hydrea  1000 mg twice daily  HISTORY OF PRESENTING ILLNESS:  Dillon [name] presents today for a follow up visit.  He was last seen on 12/10/2023.  In the interim since her last visit he is continued on his hydroxyurea  1000 mg twice daily.  On exam today Mr. Mccravy reports he is tolerating his hydroxyurea  well with no major side effects.  He is taking 1000 mg in the morning and 1000 mg at night.  Does cause him some occasional drowsiness but has not affected his appetite.  He reports he had no ulcers in his mouth or on his ankles.  He denies any bleeding, recent, or dark stools.  He is taking 81 mg p.o. daily aspirin .  Reports his energy levels have been good.  The medication cost him $5 per month, but previously cost $0 per month.  He denies any signs or symptoms concerning for blood clot such as leg pain, leg swelling, chest pain, or shortness of breath.  Overall he feels well and is willing and able to continue with this therapy at this time.  A full 10 point ROS is otherwise negative.  MEDICAL HISTORY:  Past Medical History:  Diagnosis Date   Arthritis    right shoulder   Bladder neck obstruction 04/18/2011   CKD (chronic kidney disease), stage III (HCC) 09/12/2015   ERECTILE DYSFUNCTION 11/15/2008   Essential hypertension, benign 04/28/2013   FATIGUE 11/15/2008   GERD 11/15/2008   History of adenomatous polyp of colon 05/06/2014   2010   HYPERLIPIDEMIA 11/15/2008   Impaired glucose tolerance 04/18/2011   LEG PAIN, LEFT 11/05/2010   LVH (left ventricular hypertrophy) 04/27/2012   MIGRAINE, COMMON 08/06/2010   Nocturia 12/19/2008   Polycythemia 04/27/2012   Swelling  of limb 11/05/2010   THROMBOCYTOSIS 12/19/2008   myleoproliferative disorder    SURGICAL HISTORY: Past Surgical History:  Procedure Laterality Date   APPENDECTOMY     CARDIAC CATHETERIZATION N/A 09/05/2015   Procedure: Left Heart Cath and Coronary Angiography;  Surgeon: Millicent Ally, MD;  Location: [facility];  Service: Cardiovascular;  Laterality: N/A;   CIRCUMCISION     30+yrs ago   COLONOSCOPY     POLYPECTOMY     s/p esophageal dilation     TONSILLECTOMY     UPPER GASTROINTESTINAL ENDOSCOPY      SOCIAL HISTORY: 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  Tobacco Use   Smoking status: Former    Current packs/day: 0.00    Average packs/day: 0.5 packs/day for 12.0 years (6.0 ttl pk-yrs)    Types: Cigarettes    Start date: 09/16/1958    Quit date: 09/16/1970    Years since quitting: 53.5   Smokeless tobacco: Never   Tobacco comments:    hasn't smoked in 46 years;   Vaping Use   Vaping status: Never Used  Substance and Sexual Activity   Alcohol use: Yes    Alcohol/week: 0.0 standard drinks  of alcohol    Comment: occasional;    Drug use: No   Sexual activity: Not on file  Other Topics Concern   Not on file  Social History Narrative   Lives with sister.   Social Drivers of Health   Financial Resource Strain: Medium Risk (10/15/2023)   Overall Financial Resource Strain (CARDIA)    Difficulty of Paying Living Expenses: Somewhat hard  Food Insecurity: Food Insecurity Present (10/15/2023)   Hunger Vital Sign    Worried About Running Out of Food in the Last Year: Sometimes true    Ran Out of Food in the Last Year: Sometimes true  Transportation Needs: No Transportation Needs (10/15/2023)   PRAPARE - Scientist, research (physical sciences) (Medical): No    Lack of Transportation (Non-Medical): No  Physical Activity: Inactive (10/15/2023)   Exercise Vital Sign    Days of Exercise per Week: 0 days    Minutes of Exercise per Session: 0 min  Stress: No Stress Concern Present (10/15/2023)   [employer] of Occupational Health - Occupational Stress Questionnaire    Feeling of Stress : Not at all  Social Connections: Moderately Integrated (10/15/2023)   Social Connection and Isolation Panel    Frequency of Communication with Friends and Family: Three times a week    Frequency of Social Gatherings with Friends and Family: Once a week    Attends Religious Services: More than 4 times per year    Active Member of [employer] or Organizations: Yes    Attends Banker Meetings: Never    Marital Status: Separated  Intimate Partner Violence: Not At Risk (10/15/2023)   Humiliation, Afraid, Rape, and Kick questionnaire    Fear of Current or Ex-Partner: No    Emotionally Abused: No    Physically Abused: No    Sexually Abused: No    FAMILY HISTORY: Family History  Problem Relation Age of Onset   Hypertension Mother    Cancer Father        prostate cancer   Prostate cancer Father    Hypertension Sister    Hypertension Brother    Colon cancer Neg Hx    Rectal cancer Neg Hx    Stomach cancer Neg Hx    Esophageal cancer Neg Hx    Pancreatic cancer Neg Hx     ALLERGIES:  has no known allergies.  MEDICATIONS:  Current Outpatient Medications  Medication Sig Dispense Refill   baclofen  (LIORESAL ) 10 MG tablet Take 1 tablet (10 mg total) by mouth 2 (two) times daily. 30 each 0   carvedilol  (COREG ) 25 MG tablet Take 1 tablet (25 mg total) by mouth 2 (two) times daily. 60 tablet 11   Cholecalciferol  50 MCG (2000 UT) TABS 1 tab by mouth once daily (Patient not taking: Reported on 08/11/2023) 30 tablet 99   fluticasone  (FLONASE ) 50 MCG/ACT nasal spray Place 2 sprays into both nostrils daily. (Patient not taking:  Reported on 08/11/2023) 16 g 6   furosemide  (LASIX ) 20 MG tablet TAKE 1 TABLET BY MOUTH DAILY AS NEEDED FOR EDEMA.PLEASE CALL TO SCHEDULE OVERDUE APPT WITH DR KELLEY 30 tablet 0   hydroxyurea  (HYDREA ) 500 MG capsule Take 2 capsules (1,000 mg total) by mouth 2 (two) times daily. 120 capsule 3   lidocaine  (LIDODERM ) 5 % Place 1 patch onto the skin daily. Remove & Discard patch within 12 hours or as directed by MD 30 patch 0   lisinopril  (ZESTRIL ) 10 MG tablet Take 1 tablet (10  mg total) by mouth daily. 30 tablet 11   lovastatin  (MEVACOR ) 40 MG tablet TAKE 1 TABLET BY MOUTH EVERY DAY 90 tablet 3   predniSONE  (STERAPRED UNI-PAK 21 TAB) 10 MG (21) TBPK tablet As directed x 6 days (Patient not taking: Reported on 10/15/2023) 21 tablet 0   tadalafil  (CIALIS ) 20 MG tablet Take 0.5-1 tablets (10-20 mg total) by mouth every other day as needed for erectile dysfunction. 10 tablet 11   tamsulosin  (FLOMAX ) 0.4 MG CAPS capsule TAKE 1 CAPSULE BY MOUTH EVERY DAY 90 capsule 1   tiZANidine  (ZANAFLEX ) 2 MG tablet Take 1 tablet (2 mg total) by mouth every 6 (six) hours as needed for muscle spasms. 30 tablet 0   No current facility-administered medications for this visit.    REVIEW OF SYSTEMS:   Constitutional: ( - ) fevers, ( - )  chills , ( - ) night sweats Eyes: ( - ) blurriness of vision, ( - ) double vision, ( - ) watery eyes Ears, nose, mouth, throat, and face: ( - ) mucositis, ( - ) sore throat Respiratory: ( - ) cough, ( - ) dyspnea, ( - ) wheezes Cardiovascular: ( - ) palpitation, ( - ) chest discomfort, ( - ) lower extremity swelling Gastrointestinal:  ( - ) nausea, ( - ) heartburn, ( - ) change in bowel habits Skin: ( - ) abnormal skin rashes Lymphatics: ( - ) new lymphadenopathy, ( - ) easy bruising Neurological: ( - ) numbness, ( - ) tingling, ( - ) new weaknesses Behavioral/Psych: ( - ) mood change, ( - ) new changes  All other systems were reviewed with the patient and are negative.  PHYSICAL  EXAMINATION: ECOG PERFORMANCE STATUS: 0 - Asymptomatic  Vitals:   03/04/24 1507 03/04/24 1514  BP: (!) 151/67 (!) 156/69  Pulse: 60   Resp: 16   Temp: 98 F (36.7 C)   SpO2: 100%     Filed Weights   03/04/24 1507  Weight: 168 lb 3.2 oz (76.3 kg)     GENERAL: well appearing male in NAD  SKIN: skin color, texture, turgor are normal, no rashes or significant lesions EYES: conjunctiva are pink and non-injected, sclera clear LUNGS: clear to auscultation and percussion with normal breathing effort HEART: regular rate & rhythm and no murmurs and no lower extremity edema Musculoskeletal: no cyanosis of digits and no clubbing  PSYCH: alert & oriented x 3, fluent speech NEURO: no focal motor/sensory deficits  LABORATORY DATA:  I have reviewed the data as listed    Latest Ref Rng & Units 03/04/2024    2:52 PM 12/10/2023    9:59 AM 11/25/2023    2:20 PM  CBC  WBC 4.0 - 10.5 K/uL 4.9  6.5  9.2   Hemoglobin 13.0 - 17.0 g/dL 9.7  78.2  95.6   Hematocrit 39.0 - 52.0 % 29.2  31.8  33.0   Platelets 150 - 400 K/uL 398  223  622        Latest Ref Rng & Units 03/04/2024    2:52 PM 12/10/2023    9:59 AM 11/25/2023    2:20 PM  CMP  Glucose 70 - 99 mg/dL 92  95  99   BUN 8 - 23 mg/dL 34  23  15   Creatinine 0.61 - 1.24 mg/dL 2.13  0.86  5.78   Sodium 135 - 145 mmol/L 140  140  140   Potassium 3.5 - 5.1 mmol/L 5.1  4.1  4.2   Chloride 98 - 111 mmol/L 107  107  108   CO2 22 - 32 mmol/L 27  28  30    Calcium 8.9 - 10.3 mg/dL 9.6  9.6  8.7   Total Protein 6.5 - 8.1 g/dL 7.1  7.0  6.1   Total Bilirubin 0.0 - 1.2 mg/dL 0.4  0.6  0.4   Alkaline Phos 38 - 126 U/L 54  54  48   AST 15 - 41 U/L 15  17  16    ALT 0 - 44 U/L 11  17  14       PATHOLOGY: Collected Date: 11/20/2009   Received Date: 11/20/2009    FINAL DIAGNOSIS    Bone Marrow, Aspirate,Biopsy, and Clot, left iliac crest :   - HYPERCELLULAR MARROW WITH FEATURES OF CHRONIC MYELOPROLIFERATIVE DISORDER.   - SEE COMMENT.   PERIPHERAL  BLOOD:   - THROMBOCYTOSIS.   ASSESSMENT & PLAN JAMY CLECKLER is a 78 y.o. male who presents for a follow up for JAK2 positive essential thrombocythemia.   #ZOX0 Essential Thrombocythemia --Labs today reviewed with patient. WBC 4.9, hemoglobin 9.7, MCV 115, platelets 398 K. Creatinine mildly elevated. LFTs normal.  --Currently taking ASA 81 mg daily --Currently taking Hydrea  1000 mg BID. Recommend to continue current dose.  --RTC in 4 weeks for lab check and 12 weeks for labs/follow up.   # Anemia -- Patient has macrocytosis secondary to his hydroxyurea .  Platelets are under good control but hemoglobin is approximately 10. -- Appears to been an acute drop from 12 noted earlier this year. -- Ferritin levels collected today, will collect additional labs to search for other possible etiologies for his anemia.   No orders of the defined types were placed in this encounter.  All questions were answered. The patient knows to call the clinic with any problems, questions or concerns.  I have spent a total of 30 minutes minutes of face-to-face and non-face-to-face time, preparing to see the patient, performing a medically appropriate examination, counseling and educating the patient, ordering medications, documenting clinical information in the electronic health record, and care coordination.   Rogerio Clay, MD Department of Hematology/Oncology [facility] Phone: [phone] Pager: [phone] Email: [email or web address]

## 2024-03-05 ENCOUNTER — Other Ambulatory Visit: Payer: Self-pay | Admitting: Internal Medicine

## 2024-03-28 ENCOUNTER — Other Ambulatory Visit: Payer: Self-pay | Admitting: Internal Medicine

## 2024-04-01 ENCOUNTER — Other Ambulatory Visit

## 2024-04-02 ENCOUNTER — Inpatient Hospital Stay: Attending: Hematology and Oncology

## 2024-04-02 ENCOUNTER — Other Ambulatory Visit: Payer: Self-pay | Admitting: *Deleted

## 2024-04-02 DIAGNOSIS — D473 Essential (hemorrhagic) thrombocythemia: Secondary | ICD-10-CM | POA: Insufficient documentation

## 2024-04-02 LAB — CMP (CANCER CENTER ONLY)
ALT: 10 U/L (ref 0–44)
AST: 14 U/L — ABNORMAL LOW (ref 15–41)
Albumin: 4.2 g/dL (ref 3.5–5.0)
Alkaline Phosphatase: 48 U/L (ref 38–126)
Anion gap: 5 (ref 5–15)
BUN: 21 mg/dL (ref 8–23)
CO2: 29 mmol/L (ref 22–32)
Calcium: 9.5 mg/dL (ref 8.9–10.3)
Chloride: 109 mmol/L (ref 98–111)
Creatinine: 1.3 mg/dL — ABNORMAL HIGH (ref 0.61–1.24)
GFR, Estimated: 56 mL/min — ABNORMAL LOW (ref >60)
Glucose, Bld: 108 mg/dL — ABNORMAL HIGH (ref 70–99)
Potassium: 3.9 mmol/L (ref 3.5–5.1)
Sodium: 143 mmol/L (ref 135–145)
Total Bilirubin: 0.5 mg/dL (ref 0.0–1.2)
Total Protein: 6.7 g/dL (ref 6.5–8.1)

## 2024-04-02 LAB — CBC WITH DIFFERENTIAL (CANCER CENTER ONLY)
Abs Immature Granulocytes: 0.02 K/uL (ref 0.00–0.07)
Basophils Absolute: 0 K/uL (ref 0.0–0.1)
Basophils Relative: 1 %
Eosinophils Absolute: 0 K/uL (ref 0.0–0.5)
Eosinophils Relative: 1 %
HCT: 24.9 % — ABNORMAL LOW (ref 39.0–52.0)
Hemoglobin: 8.3 g/dL — ABNORMAL LOW (ref 13.0–17.0)
Immature Granulocytes: 1 %
Lymphocytes Relative: 37 %
Lymphs Abs: 1.1 K/uL (ref 0.7–4.0)
MCH: 38.2 pg — ABNORMAL HIGH (ref 26.0–34.0)
MCHC: 33.3 g/dL (ref 30.0–36.0)
MCV: 114.7 fL — ABNORMAL HIGH (ref 80.0–100.0)
Monocytes Absolute: 0.1 K/uL (ref 0.1–1.0)
Monocytes Relative: 3 %
Neutro Abs: 1.7 K/uL (ref 1.7–7.7)
Neutrophils Relative %: 57 %
Platelet Count: 188 K/uL (ref 150–400)
RBC: 2.17 MIL/uL — ABNORMAL LOW (ref 4.22–5.81)
RDW: 13.8 % (ref 11.5–15.5)
WBC Count: 2.9 K/uL — ABNORMAL LOW (ref 4.0–10.5)
nRBC: 0 % (ref 0.0–0.2)

## 2024-04-02 LAB — FERRITIN: Ferritin: 296 ng/mL (ref 24–336)

## 2024-04-29 ENCOUNTER — Other Ambulatory Visit: Payer: Self-pay | Admitting: Hematology and Oncology

## 2024-04-29 ENCOUNTER — Inpatient Hospital Stay: Attending: Hematology and Oncology

## 2024-04-29 DIAGNOSIS — D473 Essential (hemorrhagic) thrombocythemia: Secondary | ICD-10-CM

## 2024-05-13 DIAGNOSIS — Z961 Presence of intraocular lens: Secondary | ICD-10-CM | POA: Diagnosis not present

## 2024-05-13 DIAGNOSIS — H538 Other visual disturbances: Secondary | ICD-10-CM | POA: Diagnosis not present

## 2024-05-24 DIAGNOSIS — H26492 Other secondary cataract, left eye: Secondary | ICD-10-CM | POA: Diagnosis not present

## 2024-05-27 ENCOUNTER — Inpatient Hospital Stay (HOSPITAL_BASED_OUTPATIENT_CLINIC_OR_DEPARTMENT_OTHER): Admitting: Hematology and Oncology

## 2024-05-27 ENCOUNTER — Inpatient Hospital Stay: Attending: Hematology and Oncology

## 2024-05-27 VITALS — BP 162/84 | HR 70 | Temp 97.2°F | Resp 14 | Wt 159.7 lb

## 2024-05-27 DIAGNOSIS — Z79899 Other long term (current) drug therapy: Secondary | ICD-10-CM | POA: Diagnosis not present

## 2024-05-27 DIAGNOSIS — D473 Essential (hemorrhagic) thrombocythemia: Secondary | ICD-10-CM | POA: Insufficient documentation

## 2024-05-27 DIAGNOSIS — D75839 Thrombocytosis, unspecified: Secondary | ICD-10-CM

## 2024-05-27 DIAGNOSIS — Z8042 Family history of malignant neoplasm of prostate: Secondary | ICD-10-CM | POA: Insufficient documentation

## 2024-05-27 DIAGNOSIS — Z87891 Personal history of nicotine dependence: Secondary | ICD-10-CM | POA: Diagnosis not present

## 2024-05-27 DIAGNOSIS — D649 Anemia, unspecified: Secondary | ICD-10-CM | POA: Diagnosis not present

## 2024-05-27 DIAGNOSIS — D509 Iron deficiency anemia, unspecified: Secondary | ICD-10-CM

## 2024-05-27 LAB — CMP (CANCER CENTER ONLY)
ALT: 7 U/L (ref 0–44)
AST: 13 U/L — ABNORMAL LOW (ref 15–41)
Albumin: 4.6 g/dL (ref 3.5–5.0)
Alkaline Phosphatase: 47 U/L (ref 38–126)
Anion gap: 6 (ref 5–15)
BUN: 26 mg/dL — ABNORMAL HIGH (ref 8–23)
CO2: 26 mmol/L (ref 22–32)
Calcium: 9.6 mg/dL (ref 8.9–10.3)
Chloride: 108 mmol/L (ref 98–111)
Creatinine: 1.52 mg/dL — ABNORMAL HIGH (ref 0.61–1.24)
GFR, Estimated: 46 mL/min — ABNORMAL LOW (ref >60)
Glucose, Bld: 90 mg/dL (ref 70–99)
Potassium: 4.3 mmol/L (ref 3.5–5.1)
Sodium: 140 mmol/L (ref 135–145)
Total Bilirubin: 0.8 mg/dL (ref 0.0–1.2)
Total Protein: 7.3 g/dL (ref 6.5–8.1)

## 2024-05-27 LAB — CBC WITH DIFFERENTIAL (CANCER CENTER ONLY)
Abs Immature Granulocytes: 0.04 K/uL (ref 0.00–0.07)
Basophils Absolute: 0.1 K/uL (ref 0.0–0.1)
Basophils Relative: 1 %
Eosinophils Absolute: 0 K/uL (ref 0.0–0.5)
Eosinophils Relative: 1 %
HCT: 29.4 % — ABNORMAL LOW (ref 39.0–52.0)
Hemoglobin: 9.6 g/dL — ABNORMAL LOW (ref 13.0–17.0)
Immature Granulocytes: 1 %
Lymphocytes Relative: 34 %
Lymphs Abs: 1.5 K/uL (ref 0.7–4.0)
MCH: 38.4 pg — ABNORMAL HIGH (ref 26.0–34.0)
MCHC: 32.7 g/dL (ref 30.0–36.0)
MCV: 117.6 fL — ABNORMAL HIGH (ref 80.0–100.0)
Monocytes Absolute: 0.3 K/uL (ref 0.1–1.0)
Monocytes Relative: 6 %
Neutro Abs: 2.7 K/uL (ref 1.7–7.7)
Neutrophils Relative %: 57 %
Platelet Count: 402 K/uL — ABNORMAL HIGH (ref 150–400)
RBC: 2.5 MIL/uL — ABNORMAL LOW (ref 4.22–5.81)
RDW: 15.5 % (ref 11.5–15.5)
WBC Count: 4.6 K/uL (ref 4.0–10.5)
nRBC: 0 % (ref 0.0–0.2)

## 2024-05-27 NOTE — Progress Notes (Unsigned)
 [facility] Telephone:([phone]   Fax:(336) [phone]  PROGRESS NOTE  Patient Care Team: Norleen Lynwood ORN, MD as PCP - General Debrah Lamar BIRCH, MD (Inactive) as Consulting Physician (Gastroenterology)  CHIEF COMPLAINTS/PURPOSE OF CONSULTATION:  JAK2 positive essential thrombocythemia.   CURRENT TREATMENT: Hydrea  1000 mg twice daily  HISTORY OF PRESENTING ILLNESS:  Dillon [name] presents today for a follow up visit.  He was last seen on 03/04/2024.  In the interim since her last visit he is continued on his hydroxyurea  1000 mg twice daily.  On exam today Dillon [name] reports he has been well overall in the interim since our last visit.  He continues to take hydroxyurea  1000 mg twice daily.  He reports he is not having any major side effects though it does occasionally cause him to be drowsy.  He reports he does have every now and again nausea and loose stools but no vomiting.  He reports he has been eating well and his weight is steady.  He reports that he has had no recent issues with fevers, chills, sweats, lightheadedness, dizziness, shortness of breath.  He had no trouble with bleeding, bruising, or dark stools.  He continues to take his 81 mg of aspirin  as well with his hydroxyurea .  Overall he feels well and is willing and able to continue on this therapy at this time.  MEDICAL HISTORY:  Past Medical History:  Diagnosis Date   Arthritis    right shoulder   Bladder neck obstruction 04/18/2011   CKD (chronic kidney disease), stage III (HCC) 09/12/2015   ERECTILE DYSFUNCTION 11/15/2008   Essential hypertension, benign 04/28/2013   FATIGUE 11/15/2008   GERD 11/15/2008   History of adenomatous polyp of colon 05/06/2014   2010   HYPERLIPIDEMIA 11/15/2008   Impaired glucose tolerance 04/18/2011   LEG PAIN, LEFT 11/05/2010   LVH (left ventricular hypertrophy) 04/27/2012   MIGRAINE, COMMON 08/06/2010   Nocturia 12/19/2008   Polycythemia 04/27/2012   Swelling of limb  11/05/2010   THROMBOCYTOSIS 12/19/2008   myleoproliferative disorder    SURGICAL HISTORY: Past Surgical History:  Procedure Laterality Date   APPENDECTOMY     CARDIAC CATHETERIZATION N/A 09/05/2015   Procedure: Left Heart Cath and Coronary Angiography;  Surgeon: Debby DELENA Sor, MD;  Location: [facility];  Service: Cardiovascular;  Laterality: N/A;   CIRCUMCISION     30+yrs ago   COLONOSCOPY     POLYPECTOMY     s/p esophageal dilation     TONSILLECTOMY     UPPER GASTROINTESTINAL ENDOSCOPY      SOCIAL HISTORY: 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  Tobacco Use   Smoking status: Former    Current packs/day: 0.00    Average packs/day: 0.5 packs/day for 12.0 years (6.0 ttl pk-yrs)    Types: Cigarettes    Start date: 09/16/1958    Quit date: 09/16/1970    Years since quitting: 53.7   Smokeless tobacco: Never   Tobacco comments:    hasn't smoked in 46 years;   Vaping Use   Vaping status: Never Used  Substance and Sexual Activity   Alcohol use: Yes    Alcohol/week: 0.0 standard drinks of alcohol    Comment: occasional;  Drug use: No   Sexual activity: Not on file  Other Topics Concern   Not on file  Social History Narrative   Lives with sister.   Social Drivers of Health   Financial Resource Strain: Medium Risk (10/15/2023)   Overall Financial Resource Strain (CARDIA)    Difficulty of Paying Living Expenses: Somewhat hard  Food Insecurity: Food Insecurity Present (10/15/2023)   Hunger Vital Sign    Worried About Running Out of Food in the Last Year: Sometimes true    Ran Out of Food in the Last Year: Sometimes true  Transportation Needs: No Transportation Needs (10/15/2023)   PRAPARE - Administrator, Civil Service  (Medical): No    Lack of Transportation (Non-Medical): No  Physical Activity: Inactive (10/15/2023)   Exercise Vital Sign    Days of Exercise per Week: 0 days    Minutes of Exercise per Session: 0 min  Stress: No Stress Concern Present (10/15/2023)   [employer] of Occupational Health - Occupational Stress Questionnaire    Feeling of Stress : Not at all  Social Connections: Moderately Integrated (10/15/2023)   Social Connection and Isolation Panel    Frequency of Communication with Friends and Family: Three times a week    Frequency of Social Gatherings with Friends and Family: Once a week    Attends Religious Services: More than 4 times per year    Active Member of [employer] or Organizations: Yes    Attends Banker Meetings: Never    Marital Status: Separated  Intimate Partner Violence: Not At Risk (10/15/2023)   Humiliation, Afraid, Rape, and Kick questionnaire    Fear of Current or Ex-Partner: No    Emotionally Abused: No    Physically Abused: No    Sexually Abused: No    FAMILY HISTORY: Family History  Problem Relation Age of Onset   Hypertension Mother    Cancer Father        prostate cancer   Prostate cancer Father    Hypertension Sister    Hypertension Brother    Colon cancer Neg Hx    Rectal cancer Neg Hx    Stomach cancer Neg Hx    Esophageal cancer Neg Hx    Pancreatic cancer Neg Hx     ALLERGIES:  has no known allergies.  MEDICATIONS:  Current Outpatient Medications  Medication Sig Dispense Refill   baclofen  (LIORESAL ) 10 MG tablet Take 1 tablet (10 mg total) by mouth 2 (two) times daily. 30 each 0   carvedilol  (COREG ) 25 MG tablet Take 1 tablet (25 mg total) by mouth 2 (two) times daily. 60 tablet 11   Cholecalciferol  50 MCG (2000 UT) TABS 1 tab by mouth once daily (Patient not taking: Reported on 08/11/2023) 30 tablet 99   fluticasone  (FLONASE ) 50 MCG/ACT nasal spray Place 2 sprays into both nostrils daily. (Patient not taking: Reported on  08/11/2023) 16 g 6   furosemide  (LASIX ) 20 MG tablet TAKE 1 TABLET BY MOUTH DAILY AS NEEDED FOR EDEMA.PLEASE CALL TO SCHEDULE OVERDUE APPT WITH DR KELLEY 30 tablet 0   hydroxyurea  (HYDREA ) 500 MG capsule Take 2 capsules (1,000 mg total) by mouth 2 (two) times daily. 120 capsule 3   lidocaine  (LIDODERM ) 5 % Place 1 patch onto the skin daily. Remove & Discard patch within 12 hours or as directed by MD 30 patch 0   lisinopril  (ZESTRIL ) 10 MG tablet Take 1 tablet (10 mg total) by mouth daily. 30 tablet 11  lovastatin  (MEVACOR ) 40 MG tablet TAKE 1 TABLET BY MOUTH EVERY DAY 90 tablet 3   predniSONE  (STERAPRED UNI-PAK 21 TAB) 10 MG (21) TBPK tablet As directed x 6 days (Patient not taking: Reported on 10/15/2023) 21 tablet 0   tadalafil  (CIALIS ) 20 MG tablet Take 0.5-1 tablets (10-20 mg total) by mouth every other day as needed for erectile dysfunction. 10 tablet 11   tamsulosin  (FLOMAX ) 0.4 MG CAPS capsule TAKE 1 CAPSULE BY MOUTH EVERY DAY 90 capsule 1   tiZANidine  (ZANAFLEX ) 2 MG tablet Take 1 tablet (2 mg total) by mouth every 6 (six) hours as needed for muscle spasms. 30 tablet 0   No current facility-administered medications for this visit.    REVIEW OF SYSTEMS:   Constitutional: ( - ) fevers, ( - )  chills , ( - ) night sweats Eyes: ( - ) blurriness of vision, ( - ) double vision, ( - ) watery eyes Ears, nose, mouth, throat, and face: ( - ) mucositis, ( - ) sore throat Respiratory: ( - ) cough, ( - ) dyspnea, ( - ) wheezes Cardiovascular: ( - ) palpitation, ( - ) chest discomfort, ( - ) lower extremity swelling Gastrointestinal:  ( - ) nausea, ( - ) heartburn, ( - ) change in bowel habits Skin: ( - ) abnormal skin rashes Lymphatics: ( - ) new lymphadenopathy, ( - ) easy bruising Neurological: ( - ) numbness, ( - ) tingling, ( - ) new weaknesses Behavioral/Psych: ( - ) mood change, ( - ) new changes  All other systems were reviewed with the patient and are negative.  PHYSICAL  EXAMINATION: ECOG PERFORMANCE STATUS: 0 - Asymptomatic  There were no vitals filed for this visit.   There were no vitals filed for this visit.    GENERAL: well appearing male in NAD  SKIN: skin color, texture, turgor are normal, no rashes or significant lesions EYES: conjunctiva are pink and non-injected, sclera clear LUNGS: clear to auscultation and percussion with normal breathing effort HEART: regular rate & rhythm and no murmurs and no lower extremity edema Musculoskeletal: no cyanosis of digits and no clubbing  PSYCH: alert & oriented x 3, fluent speech NEURO: no focal motor/sensory deficits  LABORATORY DATA:  I have reviewed the data as listed    Latest Ref Rng & Units 05/27/2024    1:41 PM 04/02/2024    1:49 PM 03/04/2024    2:52 PM  CBC  WBC 4.0 - 10.5 K/uL 4.6  2.9  4.9   Hemoglobin 13.0 - 17.0 g/dL 9.6  8.3  9.7   Hematocrit 39.0 - 52.0 % 29.4  24.9  29.2   Platelets 150 - 400 K/uL 402  188  398        Latest Ref Rng & Units 04/02/2024    1:49 PM 03/04/2024    2:52 PM 12/10/2023    9:59 AM  CMP  Glucose 70 - 99 mg/dL 891  92  95   BUN 8 - 23 mg/dL 21  34  23   Creatinine 0.61 - 1.24 mg/dL 8.69  8.40  8.73   Sodium 135 - 145 mmol/L 143  140  140   Potassium 3.5 - 5.1 mmol/L 3.9  5.1  4.1   Chloride 98 - 111 mmol/L 109  107  107   CO2 22 - 32 mmol/L 29  27  28    Calcium 8.9 - 10.3 mg/dL 9.5  9.6  9.6   Total Protein 6.5 -  8.1 g/dL 6.7  7.1  7.0   Total Bilirubin 0.0 - 1.2 mg/dL 0.5  0.4  0.6   Alkaline Phos 38 - 126 U/L 48  54  54   AST 15 - 41 U/L 14  15  17    ALT 0 - 44 U/L 10  11  17       PATHOLOGY: Collected Date: 11/20/2009   Received Date: 11/20/2009    FINAL DIAGNOSIS    Bone Marrow, Aspirate,Biopsy, and Clot, left iliac crest :   - HYPERCELLULAR MARROW WITH FEATURES OF CHRONIC MYELOPROLIFERATIVE DISORDER.   - SEE COMMENT.   PERIPHERAL BLOOD:   - THROMBOCYTOSIS.   ASSESSMENT & PLAN Dillon [name] is a 78 y.o. male who presents for a follow  up for JAK2 positive essential thrombocythemia.   #GJX7 Essential Thrombocythemia --Labs today reviewed with patient. WBC 4.6, Hgb 9.6, MCV 117.6, Plt 402. Creatinine mildly elevated. LFTs normal.  --Currently taking ASA 81 mg daily --Currently taking Hydrea  1000 mg BID. Recommend to continue current dose.  --RTC in 6 weeks for lab check and 12 weeks for labs/follow up.   # Anemia -- Patient has macrocytosis secondary to his hydroxyurea .  Platelets are under good control but hemoglobin is approximately 10. -- Appears to been an acute drop from 12 noted earlier this year. -- nutritional labs show no evidence of iron deficiency.   No orders of the defined types were placed in this encounter.  All questions were answered. The patient knows to call the clinic with any problems, questions or concerns.  I have spent a total of 30 minutes minutes of face-to-face and non-face-to-face time, preparing to see the patient, performing a medically appropriate examination, counseling and educating the patient, ordering medications, documenting clinical information in the electronic health record, and care coordination.   Norleen IVAR Kidney, MD Department of Hematology/Oncology [facility] Phone: [phone] Pager: [phone] Email: [email or web address]

## 2024-05-28 ENCOUNTER — Ambulatory Visit (INDEPENDENT_AMBULATORY_CARE_PROVIDER_SITE_OTHER): Admitting: Internal Medicine

## 2024-05-28 ENCOUNTER — Encounter: Payer: Self-pay | Admitting: Internal Medicine

## 2024-05-28 VITALS — BP 154/82 | HR 80 | Temp 98.3°F | Ht 69.0 in | Wt 159.6 lb

## 2024-05-28 DIAGNOSIS — E559 Vitamin D deficiency, unspecified: Secondary | ICD-10-CM | POA: Diagnosis not present

## 2024-05-28 DIAGNOSIS — N1831 Chronic kidney disease, stage 3a: Secondary | ICD-10-CM | POA: Diagnosis not present

## 2024-05-28 DIAGNOSIS — R7302 Impaired glucose tolerance (oral): Secondary | ICD-10-CM

## 2024-05-28 DIAGNOSIS — E7800 Pure hypercholesterolemia, unspecified: Secondary | ICD-10-CM | POA: Diagnosis not present

## 2024-05-28 DIAGNOSIS — I10 Essential (primary) hypertension: Secondary | ICD-10-CM

## 2024-05-28 MED ORDER — LISINOPRIL 20 MG PO TABS: 20.0000 mg | ORAL_TABLET | Freq: Every day | ORAL | 3 refills | Status: AC

## 2024-05-28 NOTE — Progress Notes (Unsigned)
 Patient ID: Dillon [name], male   DOB: 1945/12/27, 78 y.o.   MRN: [id number]        Chief Complaint: follow up HTN, HLD and hyperglycemia , low vit d, ckd3a       HPI:  Dillon [name] is a 78 y.o. male here overall doing ok.  Pt denies chest pain, increased sob or doe, wheezing, orthopnea, PND, increased LE swelling, palpitations, dizziness or syncope.   Pt denies polydipsia, polyuria, or new focal neuro s/s.    Pt denies fever, wt loss, night sweats, loss of appetite, or other constitutional symptoms  Had flu shot last wk at the pharmacy.  Wt Readings from Last 3 Encounters:  05/28/24 159 lb 9.6 oz (72.4 kg)  05/27/24 159 lb 11.2 oz (72.4 kg)  03/04/24 168 lb 3.2 oz (76.3 kg)   BP Readings from Last 3 Encounters:  05/28/24 (!) 154/82  05/27/24 (!) 162/84  03/04/24 (!) 156/69         Past Medical History:  Diagnosis Date   Arthritis    right shoulder   Bladder neck obstruction 04/18/2011   CKD (chronic kidney disease), stage III (HCC) 09/12/2015   ERECTILE DYSFUNCTION 11/15/2008   Essential hypertension, benign 04/28/2013   FATIGUE 11/15/2008   GERD 11/15/2008   History of adenomatous polyp of colon 05/06/2014   2010   HYPERLIPIDEMIA 11/15/2008   Impaired glucose tolerance 04/18/2011   LEG PAIN, LEFT 11/05/2010   LVH (left ventricular hypertrophy) 04/27/2012   MIGRAINE, COMMON 08/06/2010   Nocturia 12/19/2008   Polycythemia 04/27/2012   Swelling of limb 11/05/2010   THROMBOCYTOSIS 12/19/2008   myleoproliferative disorder   Past Surgical History:  Procedure Laterality Date   APPENDECTOMY     CARDIAC CATHETERIZATION N/A 09/05/2015   Procedure: Left Heart Cath and Coronary Angiography;  Surgeon: Debby DELENA Sor, MD;  Location: [facility];  Service: Cardiovascular;  Laterality: N/A;   CIRCUMCISION     30+yrs ago   COLONOSCOPY     POLYPECTOMY     s/p esophageal dilation     TONSILLECTOMY     UPPER GASTROINTESTINAL ENDOSCOPY      reports that he quit smoking about 53 years  ago. His smoking use included cigarettes. He started smoking about 65 years ago. He has a 6 pack-year smoking history. He has never used smokeless tobacco. He reports current alcohol use. He reports that he does not use drugs. family history includes Cancer in his father; Hypertension in his brother, mother, and sister; Prostate cancer in his father. No Known Allergies Current Outpatient Medications on File Prior to Visit  Medication Sig Dispense Refill   baclofen  (LIORESAL ) 10 MG tablet Take 1 tablet (10 mg total) by mouth 2 (two) times daily. 30 each 0   carvedilol  (COREG ) 25 MG tablet Take 1 tablet (25 mg total) by mouth 2 (two) times daily. 60 tablet 11   furosemide  (LASIX ) 20 MG tablet TAKE 1 TABLET BY MOUTH DAILY AS NEEDED FOR EDEMA.PLEASE CALL TO SCHEDULE OVERDUE APPT WITH DR KELLEY 30 tablet 0   hydroxyurea  (HYDREA ) 500 MG capsule Take 2 capsules (1,000 mg total) by mouth 2 (two) times daily. 120 capsule 3   lidocaine  (LIDODERM ) 5 % Place 1 patch onto the skin daily. Remove & Discard patch within 12 hours or as directed by MD 30 patch 0   lovastatin  (MEVACOR ) 40 MG tablet TAKE 1 TABLET BY MOUTH EVERY DAY 90 tablet 3   tadalafil  (CIALIS ) 20 MG tablet Take  0.5-1 tablets (10-20 mg total) by mouth every other day as needed for erectile dysfunction. 10 tablet 11   tamsulosin  (FLOMAX ) 0.4 MG CAPS capsule TAKE 1 CAPSULE BY MOUTH EVERY DAY 90 capsule 1   tiZANidine  (ZANAFLEX ) 2 MG tablet Take 1 tablet (2 mg total) by mouth every 6 (six) hours as needed for muscle spasms. 30 tablet 0   No current facility-administered medications on file prior to visit.        ROS:  All others reviewed and negative.  Objective        PE:  BP (!) 154/82   Pulse 80   Temp 98.3 F (36.8 C)   Ht 5' 9 (1.753 m)   Wt 159 lb 9.6 oz (72.4 kg)   SpO2 99%   BMI 23.57 kg/m                 Constitutional: Pt appears in NAD               HENT: Head: NCAT.                Right Ear: External ear normal.                  Left Ear: External ear normal.                Eyes: . Pupils are equal, round, and reactive to light. Conjunctivae and EOM are normal               Nose: without d/c or deformity               Neck: Neck supple. Gross normal ROM               Cardiovascular: Normal rate and regular rhythm.                 Pulmonary/Chest: Effort normal and breath sounds without rales or wheezing.                Abd:  Soft, NT, ND, + BS, no organomegaly               Neurological: Pt is alert. At baseline orientation, motor grossly intact               Skin: Skin is warm. No rashes, no other new lesions, LE edema - none               Psychiatric: Pt behavior is normal without agitation   Micro: none  Cardiac tracings I have personally interpreted today:  none  Pertinent Radiological findings (summarize): none   Lab Results  Component Value Date   WBC 4.6 05/27/2024   HGB 9.6 (L) 05/27/2024   HCT 29.4 (L) 05/27/2024   PLT 402 (H) 05/27/2024   GLUCOSE 90 05/27/2024   CHOL 128 10/28/2023   TRIG 108.0 10/28/2023   HDL 61.30 10/28/2023   LDLDIRECT 57.0 12/15/2017   LDLCALC 45 10/28/2023   ALT 7 05/27/2024   AST 13 (L) 05/27/2024   NA 140 05/27/2024   K 4.3 05/27/2024   CL 108 05/27/2024   CREATININE 1.52 (H) 05/27/2024   BUN 26 (H) 05/27/2024   CO2 26 05/27/2024   TSH 2.44 10/28/2023   PSA 2.31 01/24/2022   INR 1.32 09/04/2015   HGBA1C 5.4 10/28/2023   Assessment/Plan:  Dillon [name] is a 78 y.o. Black or African American [2] male with  has a past medical history of Arthritis,  Bladder neck obstruction (04/18/2011), CKD (chronic kidney disease), stage III (HCC) (09/12/2015), ERECTILE DYSFUNCTION (11/15/2008), Essential hypertension, benign (04/28/2013), FATIGUE (11/15/2008), GERD (11/15/2008), History of adenomatous polyp of colon (05/06/2014), HYPERLIPIDEMIA (11/15/2008), Impaired glucose tolerance (04/18/2011), LEG PAIN, LEFT (11/05/2010), LVH (left ventricular hypertrophy) (04/27/2012), MIGRAINE,  COMMON (08/06/2010), Nocturia (12/19/2008), Polycythemia (04/27/2012), Swelling of limb (11/05/2010), and THROMBOCYTOSIS (12/19/2008).  CKD (chronic kidney disease), stage III Lab Results  Component Value Date   CREATININE 1.52 (H) 05/27/2024   Stable overall, cont to avoid nephrotoxins   Essential hypertension, benign BP Readings from Last 3 Encounters:  05/28/24 (!) 154/82  05/27/24 (!) 162/84  03/04/24 (!) 156/69   uncontrolled, pt to continue medical treatment coreg  25 bid, but increase lsinopril to 20- mg qd   Hyperlipidemia Lab Results  Component Value Date   LDLCALC 45 10/28/2023   Stable, pt to continue current statin lovastatin  40 mg qd   Impaired glucose tolerance Lab Results  Component Value Date   HGBA1C 5.4 10/28/2023   Stable, pt to continue current medical treatment  - diet, wt control   Vitamin D  deficiency Last vitamin D  Lab Results  Component Value Date   VD25OH 32.02 10/28/2023   Low, to start oral replacement  Followup: Return in about 6 months (around 11/25/2024).  Lynwood Rush, MD 05/29/2024 3:37 PM [facility] [place] [facility]

## 2024-05-28 NOTE — Patient Instructions (Signed)
 Ok to increase the lisinopril  to 20 mg per day  Please continue all other medications as before, and refills have been done if requested.  Please have the pharmacy call with any other refills you may need.  Please continue your efforts at being more active, low cholesterol diet, and weight control.  Please keep your appointments with your specialists as you may have planned  No further lab work needed today  Please make an Appointment to return in 6 months, or sooner if needed

## 2024-05-29 ENCOUNTER — Encounter: Payer: Self-pay | Admitting: Oncology

## 2024-05-29 ENCOUNTER — Encounter: Payer: Self-pay | Admitting: Internal Medicine

## 2024-05-29 NOTE — Assessment & Plan Note (Signed)
 Last vitamin D Lab Results  Component Value Date   VD25OH 32.02 10/28/2023   Low, to start oral replacement

## 2024-05-29 NOTE — Assessment & Plan Note (Signed)
 Lab Results  Component Value Date   CREATININE 1.52 (H) 05/27/2024   Stable overall, cont to avoid nephrotoxins

## 2024-05-29 NOTE — Assessment & Plan Note (Signed)
 Lab Results  Component Value Date   HGBA1C 5.4 10/28/2023   Stable, pt to continue current medical treatment   - diet, wt control

## 2024-05-29 NOTE — Assessment & Plan Note (Signed)
 BP Readings from Last 3 Encounters:  05/28/24 (!) 154/82  05/27/24 (!) 162/84  03/04/24 (!) 156/69   uncontrolled, pt to continue medical treatment coreg  25 bid, but increase lsinopril to 20- mg qd

## 2024-05-29 NOTE — Assessment & Plan Note (Signed)
 Lab Results  Component Value Date   LDLCALC 45 10/28/2023   Stable, pt to continue current statin lovastatin  40 mg qd

## 2024-06-07 ENCOUNTER — Ambulatory Visit: Payer: Self-pay

## 2024-06-07 NOTE — Telephone Encounter (Signed)
 FYI Only or Action Required?: FYI only for provider.  Patient was last seen in primary care on 05/28/2024 by Norleen Lynwood ORN, MD.  Called Nurse Triage reporting Dizziness.  Symptoms began 2 days ago.  Interventions attempted: Nothing.  Symptoms are: stable.  Triage Disposition: See PCP When Office is Open (Within 3 Days)  Patient/caregiver understands and will follow disposition?: Yes Reason for Disposition  [1] MILD dizziness (e.g., walking normally) AND [2] has NOT been evaluated by doctor (or NP/PA) for this  (Exception: Dizziness caused by heat exposure, sudden standing, or poor fluid intake.)  Answer Assessment - Initial Assessment Questions Denies injuries from fall. Lives with sister but was alone at the time of the fall. BP right after fall was 134/70. States he drinks 3-4 glasses of water a day.   1. DESCRIPTION: Describe your dizziness.     Felt dizzy, head got hot and fell on Saturday  2. LIGHTHEADED: Do you feel lightheaded? (e.g., somewhat faint, woozy, weak upon standing)     Felt hot, a little dizziness  3. ONSET:  When did the dizziness begin?     Saturday  4. HEART RATE: Can you tell me your heart rate? How many beats in 15 seconds?  (Note: Not all patients can do this.)       States heart did not appear to be beating fast  5. CAUSE: What do you think is causing the dizziness? (e.g., decreased fluids or food, diarrhea, emotional distress, heat exposure, new medicine, sudden standing, vomiting; unknown)     Unsure  6. RECURRENT SYMPTOM: Have you had dizziness before? If Yes, ask: When was the last time? What happened that time?     Has not happened before, or since the incident on Saturday  7. OTHER SYMPTOMS: Do you have any other symptoms? (e.g., fever, chest pain, vomiting, diarrhea, bleeding)       No  Protocols used: Dizziness - Lightheadedness-A-AH  Copied from CRM #[id number]. Topic: Clinical - Red Word Triage >> Jun 07, 2024  1:35 PM  Zy'onna H wrote: Red Word that prompted transfer to Nurse Triage:  Patient experienced a fall on Saturday - Felt Dizzy & Hot

## 2024-06-08 ENCOUNTER — Ambulatory Visit (INDEPENDENT_AMBULATORY_CARE_PROVIDER_SITE_OTHER): Admitting: Internal Medicine

## 2024-06-08 ENCOUNTER — Ambulatory Visit

## 2024-06-08 ENCOUNTER — Encounter: Payer: Self-pay | Admitting: Internal Medicine

## 2024-06-08 VITALS — BP 138/74 | HR 75 | Temp 98.5°F | Ht 69.0 in | Wt 161.8 lb

## 2024-06-08 DIAGNOSIS — R55 Syncope and collapse: Secondary | ICD-10-CM | POA: Diagnosis not present

## 2024-06-08 DIAGNOSIS — R7302 Impaired glucose tolerance (oral): Secondary | ICD-10-CM | POA: Diagnosis not present

## 2024-06-08 DIAGNOSIS — E559 Vitamin D deficiency, unspecified: Secondary | ICD-10-CM

## 2024-06-08 DIAGNOSIS — N1831 Chronic kidney disease, stage 3a: Secondary | ICD-10-CM | POA: Diagnosis not present

## 2024-06-08 DIAGNOSIS — I10 Essential (primary) hypertension: Secondary | ICD-10-CM | POA: Diagnosis not present

## 2024-06-08 NOTE — Assessment & Plan Note (Signed)
 Lab Results  Component Value Date   HGBA1C 5.4 10/28/2023   Stable, pt to continue current medical treatment   - diet, wt control

## 2024-06-08 NOTE — Assessment & Plan Note (Signed)
 Lab Results  Component Value Date   CREATININE 1.52 (H) 05/27/2024   Stable overall, cont to avoid nephrotoxins,

## 2024-06-08 NOTE — Assessment & Plan Note (Signed)
 BP Readings from Last 3 Encounters:  06/08/24 138/74  05/28/24 (!) 154/82  05/27/24 (!) 162/84   Stable, pt to continue medical treatment coreg  25 bid, lsinopril 20 qd

## 2024-06-08 NOTE — Assessment & Plan Note (Signed)
 Last vitamin D Lab Results  Component Value Date   VD25OH 32.02 10/28/2023   Low, to start oral replacement

## 2024-06-08 NOTE — Progress Notes (Signed)
 Patient ID: Dillon [name], male   DOB: 1945/10/28, 78 y.o.   MRN: [id number]        Chief Complaint: follow up syncope       HPI:  Dillon [name] is a 78 y.o. male here after episode x 1 three days ago, with standing in the kitchen alone at home just starting frying up the fish for dinner, when he felt warm in the head and neck area, then dizzy, then woke up on the floor, but only out for maybe 1-2 minutes bc the fish was only starting to brown.  Since then he has had simply no symptoms - Pt denies chest pain, increased sob or doe, wheezing, orthopnea, PND, increased LE swelling, palpitations, dizziness.   Pt denies polydipsia, polyuria, or new focal neuro s/s.    Pt denies fever, wt loss, night sweats, loss of appetite, or other constitutional symptoms  No prior episode, or hx of seizure.  No overt bleeding or dehydration it seems.  Had not taken any of his daily meds prior to this as he usually does this in evening.         Wt Readings from Last 3 Encounters:  06/08/24 161 lb 12.8 oz (73.4 kg)  05/28/24 159 lb 9.6 oz (72.4 kg)  05/27/24 159 lb 11.2 oz (72.4 kg)   BP Readings from Last 3 Encounters:  06/08/24 138/74  05/28/24 (!) 154/82  05/27/24 (!) 162/84         Past Medical History:  Diagnosis Date   Arthritis    right shoulder   Bladder neck obstruction 04/18/2011   CKD (chronic kidney disease), stage III (HCC) 09/12/2015   ERECTILE DYSFUNCTION 11/15/2008   Essential hypertension, benign 04/28/2013   FATIGUE 11/15/2008   GERD 11/15/2008   History of adenomatous polyp of colon 05/06/2014   2010   HYPERLIPIDEMIA 11/15/2008   Impaired glucose tolerance 04/18/2011   LEG PAIN, LEFT 11/05/2010   LVH (left ventricular hypertrophy) 04/27/2012   MIGRAINE, COMMON 08/06/2010   Nocturia 12/19/2008   Polycythemia 04/27/2012   Swelling of limb 11/05/2010   THROMBOCYTOSIS 12/19/2008   myleoproliferative disorder   Past Surgical History:  Procedure Laterality Date   APPENDECTOMY     CARDIAC  CATHETERIZATION N/A 09/05/2015   Procedure: Left Heart Cath and Coronary Angiography;  Surgeon: Debby DELENA Sor, MD;  Location: [facility];  Service: Cardiovascular;  Laterality: N/A;   CIRCUMCISION     30+yrs ago   COLONOSCOPY     POLYPECTOMY     s/p esophageal dilation     TONSILLECTOMY     UPPER GASTROINTESTINAL ENDOSCOPY      reports that he quit smoking about 53 years ago. His smoking use included cigarettes. He started smoking about 65 years ago. He has a 6 pack-year smoking history. He has never used smokeless tobacco. He reports current alcohol use. He reports that he does not use drugs. family history includes Cancer in his father; Hypertension in his brother, mother, and sister; Prostate cancer in his father. No Known Allergies Current Outpatient Medications on File Prior to Visit  Medication Sig Dispense Refill   baclofen  (LIORESAL ) 10 MG tablet Take 1 tablet (10 mg total) by mouth 2 (two) times daily. 30 each 0   carvedilol  (COREG ) 25 MG tablet Take 1 tablet (25 mg total) by mouth 2 (two) times daily. 60 tablet 11   furosemide  (LASIX ) 20 MG tablet TAKE 1 TABLET BY MOUTH DAILY AS NEEDED FOR EDEMA.PLEASE CALL TO  SCHEDULE OVERDUE APPT WITH DR KELLEY 30 tablet 0   hydroxyurea  (HYDREA ) 500 MG capsule Take 2 capsules (1,000 mg total) by mouth 2 (two) times daily. 120 capsule 3   lidocaine  (LIDODERM ) 5 % Place 1 patch onto the skin daily. Remove & Discard patch within 12 hours or as directed by MD 30 patch 0   lisinopril  (ZESTRIL ) 20 MG tablet Take 1 tablet (20 mg total) by mouth daily. 90 tablet 3   lovastatin  (MEVACOR ) 40 MG tablet TAKE 1 TABLET BY MOUTH EVERY DAY 90 tablet 3   tadalafil  (CIALIS ) 20 MG tablet Take 0.5-1 tablets (10-20 mg total) by mouth every other day as needed for erectile dysfunction. 10 tablet 11   tamsulosin  (FLOMAX ) 0.4 MG CAPS capsule TAKE 1 CAPSULE BY MOUTH EVERY DAY 90 capsule 1   tiZANidine  (ZANAFLEX ) 2 MG tablet Take 1 tablet (2 mg total) by mouth  every 6 (six) hours as needed for muscle spasms. 30 tablet 0   No current facility-administered medications on file prior to visit.        ROS:  All others reviewed and negative.  Objective        PE:  BP 138/74   Pulse 75   Temp 98.5 F (36.9 C)   Ht 5' 9 (1.753 m)   Wt 161 lb 12.8 oz (73.4 kg)   SpO2 98%   BMI 23.89 kg/m                 Constitutional: Pt appears in NAD               HENT: Head: NCAT.                Right Ear: External ear normal.                 Left Ear: External ear normal.                Eyes: . Pupils are equal, round, and reactive to light. Conjunctivae and EOM are normal               Nose: without d/c or deformity               Neck: Neck supple. Gross normal ROM               Cardiovascular: Normal rate and regular rhythm.                 Pulmonary/Chest: Effort normal and breath sounds without rales or wheezing.                Abd:  Soft, NT, ND, + BS, no organomegaly               Neurological: Pt is alert. At baseline orientation, motor grossly intact               Skin: Skin is warm. No rashes, no other new lesions, LE edema - none               Psychiatric: Pt behavior is normal without agitation   Micro: none  Cardiac tracings I have personally interpreted today:  ECG NSR 65 with possible mild LVH  Pertinent Radiological findings (summarize): none   Lab Results  Component Value Date   WBC 4.6 05/27/2024   HGB 9.6 (L) 05/27/2024   HCT 29.4 (L) 05/27/2024   PLT 402 (H) 05/27/2024   GLUCOSE 90 05/27/2024   CHOL 128 10/28/2023  TRIG 108.0 10/28/2023   HDL 61.30 10/28/2023   LDLDIRECT 57.0 12/15/2017   LDLCALC 45 10/28/2023   ALT 7 05/27/2024   AST 13 (L) 05/27/2024   NA 140 05/27/2024   K 4.3 05/27/2024   CL 108 05/27/2024   CREATININE 1.52 (H) 05/27/2024   BUN 26 (H) 05/27/2024   CO2 26 05/27/2024   TSH 2.44 10/28/2023   PSA 2.31 01/24/2022   INR 1.32 09/04/2015   HGBA1C 5.4 10/28/2023   Assessment/Plan:  Dillon [name] is a 78 y.o. Black or African American [2] male with  has a past medical history of Arthritis, Bladder neck obstruction (04/18/2011), CKD (chronic kidney disease), stage III (HCC) (09/12/2015), ERECTILE DYSFUNCTION (11/15/2008), Essential hypertension, benign (04/28/2013), FATIGUE (11/15/2008), GERD (11/15/2008), History of adenomatous polyp of colon (05/06/2014), HYPERLIPIDEMIA (11/15/2008), Impaired glucose tolerance (04/18/2011), LEG PAIN, LEFT (11/05/2010), LVH (left ventricular hypertrophy) (04/27/2012), MIGRAINE, COMMON (08/06/2010), Nocturia (12/19/2008), Polycythemia (04/27/2012), Swelling of limb (11/05/2010), and THROMBOCYTOSIS (12/19/2008).  Syncope With recent episode x 1 - hx suspicious for vasovagal but can't r/o other - ECG today reviewed, now for CXR, labs including cbc, echocardiogram, carotid imaging and refer cardiology   Impaired glucose tolerance Lab Results  Component Value Date   HGBA1C 5.4 10/28/2023   Stable, pt to continue current medical treatment  - diet,wt control   Essential hypertension, benign BP Readings from Last 3 Encounters:  06/08/24 138/74  05/28/24 (!) 154/82  05/27/24 (!) 162/84   Stable, pt to continue medical treatment coreg  25 bid, lsinopril 20 qd   CKD (chronic kidney disease), stage III (HCC) Lab Results  Component Value Date   CREATININE 1.52 (H) 05/27/2024   Stable overall, cont to avoid nephrotoxins,   Vitamin D  deficiency Last vitamin D  Lab Results  Component Value Date   VD25OH 32.02 10/28/2023   Low, to start oral replacement  Followup: Return in about 6 months (around 12/06/2024).  Lynwood Rush, MD 06/08/2024 7:54 PM [facility]  Primary Care - [facility]

## 2024-06-08 NOTE — Patient Instructions (Addendum)
 Your EKG was done today  Please continue all other medications as before  Please have the pharmacy call with any other refills you may need.  Please continue your efforts at being more active, low cholesterol diet, and weight control.  Please keep your appointments with your specialists as you may have planned  Please go to the XRAY Department in the first floor for the x-ray testing  Please go to the LAB at the blood drawing area for the tests to be done  You will be contacted by phone if any changes need to be made immediately.  Otherwise, you will receive a letter about your results with an explanation, but please check with MyChart first.  You will be contacted regarding the referral for: Cardiology, and Echocardiogram for the heart, and Carotid Ultrasound for the carotid arteries to check for cholesterol buildup  Please make an Appointment to return in 6 months, or sooner if needed

## 2024-06-08 NOTE — Assessment & Plan Note (Signed)
 With recent episode x 1 - hx suspicious for vasovagal but can't r/o other - ECG today reviewed, now for CXR, labs including cbc, echocardiogram, carotid imaging and refer cardiology

## 2024-06-09 ENCOUNTER — Other Ambulatory Visit: Payer: Self-pay

## 2024-06-09 ENCOUNTER — Ambulatory Visit: Payer: Self-pay | Admitting: Internal Medicine

## 2024-06-09 LAB — BASIC METABOLIC PANEL WITH GFR
BUN: 26 mg/dL — ABNORMAL HIGH (ref 6–23)
CO2: 29 meq/L (ref 19–32)
Calcium: 10 mg/dL (ref 8.4–10.5)
Chloride: 104 meq/L (ref 96–112)
Creatinine, Ser: 1.39 mg/dL (ref 0.40–1.50)
GFR: 47.82 mL/min — ABNORMAL LOW (ref >60.00)
Glucose, Bld: 75 mg/dL (ref 70–99)
Potassium: 5.1 meq/L (ref 3.5–5.1)
Sodium: 139 meq/L (ref 135–145)

## 2024-06-09 LAB — CBC WITH DIFFERENTIAL/PLATELET
Basophils Absolute: 0 K/uL (ref 0.0–0.1)
Basophils Relative: 1 % (ref 0.0–3.0)
Eosinophils Absolute: 0.1 K/uL (ref 0.0–0.7)
Eosinophils Relative: 1.6 % (ref 0.0–5.0)
HCT: 29.8 % — ABNORMAL LOW (ref 39.0–52.0)
Hemoglobin: 9.6 g/dL — ABNORMAL LOW (ref 13.0–17.0)
Lymphocytes Relative: 26.8 % (ref 12.0–46.0)
Lymphs Abs: 1.3 K/uL (ref 0.7–4.0)
MCHC: 32.1 g/dL (ref 30.0–36.0)
MCV: 120.8 fl — ABNORMAL HIGH (ref 78.0–100.0)
Monocytes Absolute: 0.3 K/uL (ref 0.1–1.0)
Monocytes Relative: 5.7 % (ref 3.0–12.0)
Neutro Abs: 3.1 K/uL (ref 1.4–7.7)
Neutrophils Relative %: 64.9 % (ref 43.0–77.0)
Platelets: 281 K/uL (ref 150.0–400.0)
RBC: 2.47 Mil/uL — ABNORMAL LOW (ref 4.22–5.81)
RDW: 17 % — ABNORMAL HIGH (ref 11.5–15.5)
WBC: 4.8 K/uL (ref 4.0–10.5)

## 2024-06-09 LAB — URINALYSIS, ROUTINE W REFLEX MICROSCOPIC
Bilirubin Urine: NEGATIVE
Hgb urine dipstick: NEGATIVE
Ketones, ur: NEGATIVE
Leukocytes,Ua: NEGATIVE
Nitrite: NEGATIVE
RBC / HPF: NONE SEEN (ref 0–?)
Specific Gravity, Urine: 1.025 (ref 1.000–1.030)
Total Protein, Urine: NEGATIVE
Urine Glucose: NEGATIVE
Urobilinogen, UA: 1 (ref 0.0–1.0)
pH: 6 (ref 5.0–8.0)

## 2024-06-09 LAB — TSH: TSH: 2.85 u[IU]/mL (ref 0.35–5.50)

## 2024-06-09 LAB — HEPATIC FUNCTION PANEL
ALT: 11 U/L (ref 0–53)
AST: 17 U/L (ref 0–37)
Albumin: 4.7 g/dL (ref 3.5–5.2)
Alkaline Phosphatase: 39 U/L (ref 39–117)
Bilirubin, Direct: 0.1 mg/dL (ref 0.0–0.3)
Total Bilirubin: 0.6 mg/dL (ref 0.2–1.2)
Total Protein: 7.2 g/dL (ref 6.0–8.3)

## 2024-06-09 MED ORDER — TAMSULOSIN HCL 0.4 MG PO CAPS: ORAL_CAPSULE | ORAL | 1 refills | Status: AC

## 2024-06-14 ENCOUNTER — Encounter: Payer: Self-pay | Admitting: Internal Medicine

## 2024-06-18 ENCOUNTER — Ambulatory Visit (HOSPITAL_COMMUNITY)
Admission: RE | Admit: 2024-06-18 | Discharge: 2024-06-18 | Disposition: A | Discharge status: Home / Self Care | Source: Ambulatory Visit | Attending: Internal Medicine | Admitting: Internal Medicine

## 2024-06-18 DIAGNOSIS — R55 Syncope and collapse: Secondary | ICD-10-CM | POA: Diagnosis not present

## 2024-06-22 ENCOUNTER — Encounter: Payer: Self-pay | Admitting: Internal Medicine

## 2024-06-27 DIAGNOSIS — N529 Male erectile dysfunction, unspecified: Secondary | ICD-10-CM | POA: Diagnosis not present

## 2024-06-27 DIAGNOSIS — M1990 Unspecified osteoarthritis, unspecified site: Secondary | ICD-10-CM | POA: Diagnosis not present

## 2024-06-27 DIAGNOSIS — I10 Essential (primary) hypertension: Secondary | ICD-10-CM | POA: Diagnosis not present

## 2024-06-27 DIAGNOSIS — Z833 Family history of diabetes mellitus: Secondary | ICD-10-CM | POA: Diagnosis not present

## 2024-06-27 DIAGNOSIS — E785 Hyperlipidemia, unspecified: Secondary | ICD-10-CM | POA: Diagnosis not present

## 2024-06-27 DIAGNOSIS — D473 Essential (hemorrhagic) thrombocythemia: Secondary | ICD-10-CM | POA: Diagnosis not present

## 2024-06-27 DIAGNOSIS — D75839 Thrombocytosis, unspecified: Secondary | ICD-10-CM | POA: Diagnosis not present

## 2024-06-27 DIAGNOSIS — Z8249 Family history of ischemic heart disease and other diseases of the circulatory system: Secondary | ICD-10-CM | POA: Diagnosis not present

## 2024-06-27 DIAGNOSIS — Z87891 Personal history of nicotine dependence: Secondary | ICD-10-CM | POA: Diagnosis not present

## 2024-07-08 ENCOUNTER — Other Ambulatory Visit: Payer: Self-pay | Admitting: Hematology and Oncology

## 2024-07-08 ENCOUNTER — Inpatient Hospital Stay: Attending: Hematology and Oncology

## 2024-07-08 DIAGNOSIS — D473 Essential (hemorrhagic) thrombocythemia: Secondary | ICD-10-CM | POA: Insufficient documentation

## 2024-07-08 LAB — CBC WITH DIFFERENTIAL (CANCER CENTER ONLY)
Abs Immature Granulocytes: 0.06 K/uL (ref 0.00–0.07)
Basophils Absolute: 0.1 K/uL (ref 0.0–0.1)
Basophils Relative: 1 %
Eosinophils Absolute: 0 K/uL (ref 0.0–0.5)
Eosinophils Relative: 1 %
HCT: 25.4 % — ABNORMAL LOW (ref 39.0–52.0)
Hemoglobin: 8.3 g/dL — ABNORMAL LOW (ref 13.0–17.0)
Immature Granulocytes: 1 %
Lymphocytes Relative: 27 %
Lymphs Abs: 1.5 K/uL (ref 0.7–4.0)
MCH: 38.6 pg — ABNORMAL HIGH (ref 26.0–34.0)
MCHC: 32.7 g/dL (ref 30.0–36.0)
MCV: 118.1 fL — ABNORMAL HIGH (ref 80.0–100.0)
Monocytes Absolute: 0.3 K/uL (ref 0.1–1.0)
Monocytes Relative: 6 %
Neutro Abs: 3.5 K/uL (ref 1.7–7.7)
Neutrophils Relative %: 64 %
Platelet Count: 324 K/uL (ref 150–400)
RBC: 2.15 MIL/uL — ABNORMAL LOW (ref 4.22–5.81)
RDW: 14.6 % (ref 11.5–15.5)
WBC Count: 5.5 K/uL (ref 4.0–10.5)
nRBC: 0 % (ref 0.0–0.2)

## 2024-07-08 LAB — CMP (CANCER CENTER ONLY)
ALT: 10 U/L (ref 0–44)
AST: 14 U/L — ABNORMAL LOW (ref 15–41)
Albumin: 4.3 g/dL (ref 3.5–5.0)
Alkaline Phosphatase: 41 U/L (ref 38–126)
Anion gap: 5 (ref 5–15)
BUN: 21 mg/dL (ref 8–23)
CO2: 29 mmol/L (ref 22–32)
Calcium: 9.6 mg/dL (ref 8.9–10.3)
Chloride: 108 mmol/L (ref 98–111)
Creatinine: 1.34 mg/dL — ABNORMAL HIGH (ref 0.61–1.24)
GFR, Estimated: 54 mL/min — ABNORMAL LOW (ref >60)
Glucose, Bld: 91 mg/dL (ref 70–99)
Potassium: 4.5 mmol/L (ref 3.5–5.1)
Sodium: 142 mmol/L (ref 135–145)
Total Bilirubin: 0.6 mg/dL (ref 0.0–1.2)
Total Protein: 6.7 g/dL (ref 6.5–8.1)

## 2024-07-14 ENCOUNTER — Inpatient Hospital Stay (HOSPITAL_COMMUNITY): Admission: RE | Admit: 2024-07-14 | Source: Ambulatory Visit

## 2024-07-16 ENCOUNTER — Ambulatory Visit (HOSPITAL_COMMUNITY)
Admission: RE | Admit: 2024-07-16 | Discharge: 2024-07-16 | Disposition: A | Discharge status: Home / Self Care | Source: Ambulatory Visit | Attending: Internal Medicine | Admitting: Internal Medicine

## 2024-07-16 DIAGNOSIS — R55 Syncope and collapse: Secondary | ICD-10-CM | POA: Diagnosis present

## 2024-07-16 LAB — ECHOCARDIOGRAM COMPLETE
Area-P 1/2: 2.95 cm2
MV M vel: 5.86 m/s
MV Peak grad: 137.4 mmHg
Radius: 0.4 cm
S' Lateral: 3.6 cm

## 2024-07-16 MED ORDER — PERFLUTREN LIPID MICROSPHERE
1.0000 mL | INTRAVENOUS | Status: AC | PRN
  Administered 2024-07-16: 2 mL via INTRAVENOUS

## 2024-07-17 ENCOUNTER — Encounter: Payer: Self-pay | Admitting: Internal Medicine

## 2024-08-10 NOTE — Progress Notes (Signed)
 Cardiology Office Note [facility]:    Date:  08/16/2024   ID:  Dillon [name], DOB 01/14/1946, MRN [id number]  PCP:  Norleen Lynwood ORN, MD   [facility] Providers Cardiologist:  Oneil Parchment, MD     Referring MD: Norleen Lynwood ORN, MD   Chief Complaint  Patient presents with   Follow-up    Syncope, CHF  syncope  History of Present Illness:    Dillon [name] is a 78 y.o. male with a hx of HTN, HLD, polycythemia, thrombocytosis, GERD, prior tobacco abuse (quit in 1972), and LVH on echo 2013. Hew as admitted 2016 with acute systolic heart failure felt due to NICM given normal coronaries on heart cath (LVEF 15-20%) and SVT vs atrial tachycardia.  GDMT was titrated. Repeat echo 02/2017 with LVEF improved to 35-40% with Grade 1 DD and moderate LAE.   He was lost to follow up after 2018. In 06/2024 PCP repeated an echo that showed LVEF 45-50% with grade 1 DD, mild MR, and no AS. He was referred back to cardiology for evaluation.   He recounts that he fell 05/2024, may have lost consciousness. He was frying fish and suddenly felt hot. He fell to the ground but was not on the ground for long, not witnessed. He does not often take lasix , about once per week. Overall, feels well. No cardiac complaints. He lives at home with his nephew, but takes care of all daily living, cleaning, and grocery shopping.   Seen by Dr. Parchment in the hospital, will assign to him.     Past Medical History:  Diagnosis Date   Arthritis    right shoulder   Bladder neck obstruction 04/18/2011   CKD (chronic kidney disease), stage III (HCC) 09/12/2015   ERECTILE DYSFUNCTION 11/15/2008   Essential hypertension, benign 04/28/2013   FATIGUE 11/15/2008   GERD 11/15/2008   History of adenomatous polyp of colon 05/06/2014   2010   HYPERLIPIDEMIA 11/15/2008   Impaired glucose tolerance 04/18/2011   LEG PAIN, LEFT 11/05/2010   LVH (left ventricular hypertrophy) 04/27/2012   MIGRAINE, COMMON 08/06/2010   Nocturia 12/19/2008    Polycythemia 04/27/2012   Swelling of limb 11/05/2010   THROMBOCYTOSIS 12/19/2008   myleoproliferative disorder    Past Surgical History:  Procedure Laterality Date   APPENDECTOMY     CARDIAC CATHETERIZATION N/A 09/05/2015   Procedure: Left Heart Cath and Coronary Angiography;  Surgeon: Debby DELENA Sor, MD;  Location: [facility];  Service: Cardiovascular;  Laterality: N/A;   CIRCUMCISION     30+yrs ago   COLONOSCOPY     POLYPECTOMY     s/p esophageal dilation     TONSILLECTOMY     UPPER GASTROINTESTINAL ENDOSCOPY      Current Medications: Current Meds  Medication Sig   baclofen  (LIORESAL ) 10 MG tablet Take 1 tablet (10 mg total) by mouth 2 (two) times daily.   carvedilol  (COREG ) 25 MG tablet Take 1 tablet (25 mg total) by mouth 2 (two) times daily.   furosemide  (LASIX ) 20 MG tablet TAKE 1 TABLET BY MOUTH DAILY AS NEEDED FOR EDEMA.PLEASE CALL TO SCHEDULE OVERDUE APPT WITH DR CHARLEEN   hydroxyurea  (HYDREA ) 500 MG capsule Take 2 capsules (1,000 mg total) by mouth 2 (two) times daily.   lisinopril  (ZESTRIL ) 20 MG tablet Take 1 tablet (20 mg total) by mouth daily.   lovastatin  (MEVACOR ) 40 MG tablet TAKE 1 TABLET BY MOUTH EVERY DAY   tadalafil  (CIALIS ) 20 MG tablet Take  0.5-1 tablets (10-20 mg total) by mouth every other day as needed for erectile dysfunction.   tamsulosin  (FLOMAX ) 0.4 MG CAPS capsule TAKE 1 CAPSULE BY MOUTH EVERY DAY   tiZANidine  (ZANAFLEX ) 2 MG tablet Take 1 tablet (2 mg total) by mouth every 6 (six) hours as needed for muscle spasms.     Allergies:   Patient has no known allergies.   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  Tobacco Use   Smoking status: Former    Current packs/day: 0.00    Average packs/day: 0.5  packs/day for 12.0 years (6.0 ttl pk-yrs)    Types: Cigarettes    Start date: 09/16/1958    Quit date: 09/16/1970    Years since quitting: 53.9   Smokeless tobacco: Never   Tobacco comments:    hasn't smoked in 46 years;   Vaping Use   Vaping status: Never Used  Substance and Sexual Activity   Alcohol use: Yes    Alcohol/week: 0.0 standard drinks of alcohol    Comment: occasional;    Drug use: No   Sexual activity: Not on file  Other Topics Concern   Not on file  Social History Narrative   Lives with sister.   Social Drivers of Health   Financial Resource Strain: Medium Risk (10/15/2023)   Overall Financial Resource Strain (CARDIA)    Difficulty of Paying Living Expenses: Somewhat hard  Food Insecurity: Food Insecurity Present (10/15/2023)   Hunger Vital Sign    Worried About Running Out of Food in the Last Year: Sometimes true    Ran Out of Food in the Last Year: Sometimes true  Transportation Needs: No Transportation Needs (10/15/2023)   PRAPARE - Administrator, Civil Service (Medical): No    Lack of Transportation (Non-Medical): No  Physical Activity: Inactive (10/15/2023)   Exercise Vital Sign    Days of Exercise per Week: 0 days    Minutes of Exercise per Session: 0 min  Stress: No Stress Concern Present (10/15/2023)   [employer] of Occupational Health - Occupational Stress Questionnaire    Feeling of Stress : Not at all  Social Connections: Moderately Integrated (10/15/2023)   Social Connection and Isolation Panel    Frequency of Communication with Friends and Family: Three times a week    Frequency of Social Gatherings with Friends and Family: Once a week    Attends Religious Services: More than 4 times per year    Active Member of [employer] or Organizations: Yes    Attends Banker Meetings: Never    Marital Status: Separated     Family History: The patient's family history includes Cancer in his father; Hypertension in his brother, mother,  and sister; Prostate cancer in his father. There is no history of Colon cancer, Rectal cancer, Stomach cancer, Esophageal cancer, or Pancreatic cancer.  ROS:   Please see the history of present illness.     All other systems reviewed and are negative.  EKGs/Labs/Other Studies Reviewed:    The following studies were reviewed today:  Echo 06/2024: 1. Left ventricular ejection fraction, by estimation, is 45 to 50%. Left  ventricular ejection fraction by 3D volume is 46 %. The left ventricle has  mildly  decreased function. The left ventricle demonstrates global  hypokinesis. There is mild concentric  left ventricular hypertrophy. Left ventricular diastolic parameters are  consistent with Grade I diastolic dysfunction (impaired relaxation).   2. Right ventricular systolic function is normal. The right ventricular  size is normal. Tricuspid regurgitation signal is inadequate for assessing  PA pressure.   3. The mitral valve is normal in structure. Mild mitral valve  regurgitation. No evidence of mitral stenosis.   4. The aortic valve is tricuspid. Aortic valve regurgitation is mild.  Aortic valve sclerosis/calcification is present, without any evidence of  aortic stenosis.   5. The inferior vena cava is normal in size with greater than 50%  respiratory variability, suggesting right atrial pressure of 3 mmHg.       Recent Labs: 06/08/2024: TSH 2.85 07/08/2024: ALT 10; BUN 21; Creatinine 1.34; Hemoglobin 8.3; Platelet Count 324; Potassium 4.5; Sodium 142  Recent Lipid Panel    Component Value Date/Time   CHOL 128 10/28/2023 1514   TRIG 108.0 10/28/2023 1514   HDL 61.30 10/28/2023 1514   CHOLHDL 2 10/28/2023 1514   VLDL 21.6 10/28/2023 1514   LDLCALC 45 10/28/2023 1514   LDLDIRECT 57.0 12/15/2017 1648     Risk Assessment/Calculations:                Physical Exam:    VS:  BP 124/64   Pulse 76   Ht 5' 9 (1.753 m)   Wt 167 lb (75.8 kg)   SpO2 95%   BMI 24.66 kg/m      Wt Readings from Last 3 Encounters:  08/16/24 167 lb (75.8 kg)  06/08/24 161 lb 12.8 oz (73.4 kg)  05/28/24 159 lb 9.6 oz (72.4 kg)     GEN:  Well nourished, well developed in no acute distress HEENT: Normal NECK: No JVD; No carotid bruits LYMPHATICS: No lymphadenopathy CARDIAC: RRR, no murmurs, rubs, gallops RESPIRATORY:  Clear to auscultation without rales, wheezing or rhonchi  ABDOMEN: Soft, non-tender, non-distended MUSCULOSKELETAL:  No edema; No deformity  SKIN: Warm and dry NEUROLOGIC:  Alert and oriented x 3 PSYCHIATRIC:  Normal affect   ASSESSMENT:    1. Enlarged prostate   2. Syncope, unspecified syncope type   3. NICM (nonischemic cardiomyopathy) (HCC)   4. LVH (left ventricular hypertrophy)   5. Essential hypertension, benign   6. Stage 3a chronic kidney disease (HCC)   7. Hyperlipidemia with target LDL less than 70   8. Incomplete bladder emptying    PLAN:    In order of problems listed above:  Syncope and collapse - unclear if he had a LOC - this occurred in Sept, no further episodes - recent EKG with PCP reviewed - will hold off on placing a heart monitor for now since he has not had a recurrence   Chronic systolic heart failure secondary to NICM - echo 08/2015: LVEF 15-20%  - cath 08/2015 with normal coronaries - echo 02/2016: LVEF 35-40% - echo 06/2024: LVEF 45-50%, mild MR - GDMT: 25 mg coreg  BID, 20 mg lsinopril, 20 mg lasix  PRN - he takes lasix  about once weekly - we discussed transitioning lisinpril to entresto, last sCr 1.34 - will hold off for now, would likely need three days of ARB during ACEI washout before starting entresto - will hold off on SGLT2i given incomplete bladder emptying and concerns about enlarged prostate   SVT vs atrial tachycardia - during hospitalization 08/2015 - doing well on 25 mg coreg  BID - no palpitations  Hypertension - managed in the context of HFrEF   Hyperlipidemia with LDL goal < 70 10/28/2023:  Cholesterol 128; HDL 61.30; LDL Cholesterol 45; Triglycerides 108.0; VLDL 21.6 - continue 40 mg lovastatin    Enlarged prostate - he expresses concern about incomplete emptying at night - already taking flomax  - will refer to OP urology- no SGLT2i - if biopsy needed, can do virtual visit   Follow up in 3 months with Dr. Jeffrie.              Medication Adjustments/Labs and Tests Ordered: Current medicines are reviewed at length with the patient today.  Concerns regarding medicines are outlined above.  Orders Placed This Encounter  Procedures   Ambulatory referral to Urology   No orders of the defined types were placed in this encounter.   Patient Instructions  Medication Instructions:  Your physician recommends that you continue on your current medications as directed. Please refer to the Current Medication list given to you today.  *If you need a refill on your cardiac medications before your next appointment, please call your pharmacy*  Lab Work: None ordered  If you have labs (blood work) drawn today and your tests are completely normal, you will receive your results only by: MyChart Message (if you have MyChart) OR A paper copy in the mail If you have any lab test that is abnormal or we need to change your treatment, we will call you to review the results.  Testing/Procedures: None ordered   Follow-Up: At [facility], you and your health needs are our priority.  As part of our continuing mission to provide you with exceptional heart care, our providers are all part of one team.  This team includes your primary Cardiologist (physician) and Advanced Practice Providers or APPs (Physician Assistants and Nurse Practitioners) who all work together to provide you with the care you need, when you need it.  Your next appointment:   3 month(s)  Provider:   Oneil Jeffrie, MD    We recommend signing up for the patient portal called MyChart.  Sign up information  is provided on this After Visit Summary.  MyChart is used to connect with patients for Virtual Visits (Telemedicine).  Patients are able to view lab/test results, encounter notes, upcoming appointments, etc.  Non-urgent messages can be sent to your provider as well.   To learn more about what you can do with MyChart, go to [email or web address].   Other Instructions You have been referred to Urology, Dr. Kristie with [facility].  They will call you to arrange an appointment.            Signed, Jon Nat Hails, [place]  08/16/2024 10:25 AM    [facility]

## 2024-08-16 ENCOUNTER — Encounter: Payer: Self-pay | Admitting: Physician Assistant

## 2024-08-16 ENCOUNTER — Ambulatory Visit: Attending: Physician Assistant | Admitting: Physician Assistant

## 2024-08-16 VITALS — BP 124/64 | HR 76 | Ht 69.0 in | Wt 167.0 lb

## 2024-08-16 DIAGNOSIS — R55 Syncope and collapse: Secondary | ICD-10-CM | POA: Diagnosis not present

## 2024-08-16 DIAGNOSIS — R339 Retention of urine, unspecified: Secondary | ICD-10-CM | POA: Diagnosis not present

## 2024-08-16 DIAGNOSIS — N1831 Chronic kidney disease, stage 3a: Secondary | ICD-10-CM

## 2024-08-16 DIAGNOSIS — I10 Essential (primary) hypertension: Secondary | ICD-10-CM

## 2024-08-16 DIAGNOSIS — I428 Other cardiomyopathies: Secondary | ICD-10-CM

## 2024-08-16 DIAGNOSIS — I517 Cardiomegaly: Secondary | ICD-10-CM | POA: Diagnosis not present

## 2024-08-16 DIAGNOSIS — E785 Hyperlipidemia, unspecified: Secondary | ICD-10-CM | POA: Diagnosis not present

## 2024-08-16 DIAGNOSIS — N400 Enlarged prostate without lower urinary tract symptoms: Secondary | ICD-10-CM | POA: Diagnosis not present

## 2024-08-16 NOTE — Patient Instructions (Addendum)
 Medication Instructions:  Your physician recommends that you continue on your current medications as directed. Please refer to the Current Medication list given to you today.  *If you need a refill on your cardiac medications before your next appointment, please call your pharmacy*  Lab Work: None ordered  If you have labs (blood work) drawn today and your tests are completely normal, you will receive your results only by: MyChart Message (if you have MyChart) OR A paper copy in the mail If you have any lab test that is abnormal or we need to change your treatment, we will call you to review the results.  Testing/Procedures: None ordered   Follow-Up: At [facility], you and your health needs are our priority.  As part of our continuing mission to provide you with exceptional heart care, our providers are all part of one team.  This team includes your primary Cardiologist (physician) and Advanced Practice Providers or APPs (Physician Assistants and Nurse Practitioners) who all work together to provide you with the care you need, when you need it.  Your next appointment:   3 month(s)  Provider:   Oneil Parchment, MD    We recommend signing up for the patient portal called MyChart.  Sign up information is provided on this After Visit Summary.  MyChart is used to connect with patients for Virtual Visits (Telemedicine).  Patients are able to view lab/test results, encounter notes, upcoming appointments, etc.  Non-urgent messages can be sent to your provider as well.   To learn more about what you can do with MyChart, go to [email or web address].   Other Instructions You have been referred to Urology, Dr. Kristie with [facility].  They will call you to arrange an appointment.

## 2024-08-16 NOTE — Addendum Note (Signed)
 Addended by: MADIE JON SAILOR on: 08/16/2024 11:42 AM   Modules accepted: Level of Service

## 2024-08-26 ENCOUNTER — Inpatient Hospital Stay

## 2024-08-26 ENCOUNTER — Other Ambulatory Visit: Payer: Self-pay | Admitting: Hematology and Oncology

## 2024-08-26 ENCOUNTER — Ambulatory Visit: Attending: Hematology and Oncology | Admitting: Hematology and Oncology

## 2024-08-26 DIAGNOSIS — D473 Essential (hemorrhagic) thrombocythemia: Secondary | ICD-10-CM

## 2024-08-26 NOTE — Progress Notes (Deleted)
 [facility] [phone]   [phone]  PROGRESS NOTE  Patient Care Team: Norleen Lynwood ORN, MD as PCP - General Jeffrie Oneil BROCKS, MD as PCP - Cardiology (Cardiology) Debrah Lamar BIRCH, MD (Inactive) as Consulting Physician (Gastroenterology)  CHIEF COMPLAINTS/PURPOSE OF CONSULTATION:  JAK2 positive essential thrombocythemia.   CURRENT TREATMENT: Hydrea  1000 mg twice daily  HISTORY OF PRESENTING ILLNESS:  [name].  He was last seen on 03/04/2024.  In the interim since her last [name] he is continued on his hydroxyurea  1000 mg twice daily.  On exam today Dillon [name] reports he has been well overall in the interim since our last [name].  He continues to take hydroxyurea  1000 mg twice daily.  He reports he is not having any major side effects though it does occasionally cause him to be drowsy.  He reports he does have every now and again nausea and loose stools but no vomiting.  He reports he has been eating well and his weight is steady.  He reports that he has had no recent issues with fevers, chills, sweats, lightheadedness, dizziness, shortness of breath.  He had no trouble with bleeding, bruising, or dark stools.  He continues to take his 81 mg of aspirin  as well with his hydroxyurea .  Overall he feels well and is willing and able to continue on this therapy at this time.  MEDICAL HISTORY:  Past Medical History:  Diagnosis Date   Arthritis    right shoulder   Bladder neck obstruction 04/18/2011   CKD (chronic kidney disease), stage III (HCC) 09/12/2015   ERECTILE DYSFUNCTION 11/15/2008   Essential hypertension, benign 04/28/2013   FATIGUE 11/15/2008   GERD 11/15/2008   History of adenomatous polyp of colon 05/06/2014   2010   HYPERLIPIDEMIA 11/15/2008   Impaired glucose tolerance 04/18/2011   LEG PAIN, LEFT 11/05/2010   LVH (left ventricular hypertrophy) 04/27/2012   MIGRAINE, COMMON 08/06/2010   Nocturia 12/19/2008    Polycythemia 04/27/2012   Swelling of limb 11/05/2010   THROMBOCYTOSIS 12/19/2008   myleoproliferative disorder    SURGICAL HISTORY: Past Surgical History:  Procedure Laterality Date   APPENDECTOMY     CARDIAC CATHETERIZATION N/A 09/05/2015   Procedure: Left Heart Cath and Coronary Angiography;  Surgeon: Debby DELENA Sor, MD;  Location: [facility];  Service: Cardiovascular;  Laterality: N/A;   CIRCUMCISION     30+yrs ago   COLONOSCOPY     POLYPECTOMY     s/p esophageal dilation     TONSILLECTOMY     UPPER GASTROINTESTINAL ENDOSCOPY      SOCIAL HISTORY: 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  Tobacco Use   Smoking status: Former    Current packs/day: 0.00    Average packs/day: 0.5 packs/day for 12.0 years (6.0 ttl pk-yrs)    Types: Cigarettes    Start date: 09/16/1958    Quit date: 09/16/1970    Years since quitting: 53.9   Smokeless tobacco: Never   Tobacco comments:    hasn't smoked in 46 years;   Vaping Use   Vaping status: Never Used  Substance and Sexual Activity   Alcohol use: Yes    Alcohol/week: 0.0  standard drinks of alcohol    Comment: occasional;    Drug use: No   Sexual activity: Not on file  Other Topics Concern   Not on file  Social History Narrative   Lives with sister.   Social Drivers of Health   Tobacco Use: Medium Risk (08/16/2024)   Patient History    Smoking Tobacco Use: Former    Smokeless Tobacco Use: Never    Passive Exposure: Not on file  Financial Resource Strain: Medium Risk (10/15/2023)   Overall Financial Resource Strain (CARDIA)    Difficulty of Paying Living Expenses: Somewhat hard  Food Insecurity: Food Insecurity Present (10/15/2023)   Hunger Vital Sign    Worried About Running Out of Food  in the Last Year: Sometimes true    Ran Out of Food in the Last Year: Sometimes true  Transportation Needs: No Transportation Needs (10/15/2023)   PRAPARE - Administrator, Civil Service (Medical): No    Lack of Transportation (Non-Medical): No  Physical Activity: Inactive (10/15/2023)   Exercise Vital Sign    Days of Exercise per Week: 0 days    Minutes of Exercise per Session: 0 min  Stress: No Stress Concern Present (10/15/2023)   [employer] of Occupational Health - Occupational Stress Questionnaire    Feeling of Stress : Not at all  Social Connections: Moderately Integrated (10/15/2023)   Social Connection and Isolation Panel    Frequency of Communication with Friends and Family: Three times a week    Frequency of Social Gatherings with Friends and Family: Once a week    Attends Religious Services: More than 4 times per year    Active Member of [employer] or Organizations: Yes    Attends Banker Meetings: Never    Marital Status: Separated  Intimate Partner Violence: Not At Risk (10/15/2023)   Humiliation, Afraid, Rape, and Kick questionnaire    Fear of Current or Ex-Partner: No    Emotionally Abused: No    Physically Abused: No    Sexually Abused: No  Depression (PHQ2-9): Low Risk (10/28/2023)   Depression (PHQ2-9)    PHQ-2 Score: 0  Alcohol Screen: Low Risk (10/15/2023)   Alcohol Screen    Last Alcohol Screening Score (AUDIT): 0  Housing: Unknown (10/15/2023)   Housing Stability Vital Sign    Unable to Pay for Housing in the Last Year: No    Number of Times Moved in the Last Year: Not on file    Homeless in the Last Year: No  Utilities: Not At Risk (10/15/2023)   AHC Utilities    Threatened with loss of utilities: No  Health Literacy: Adequate Health Literacy (10/15/2023)   B1300 Health Literacy    Frequency of need for help with medical instructions: Never    FAMILY HISTORY: Family History  Problem Relation Age of Onset   Hypertension Mother     Cancer Father        prostate cancer   Prostate cancer Father    Hypertension Sister    Hypertension Brother    Colon cancer Neg Hx    Rectal cancer Neg Hx    Stomach cancer Neg Hx    Esophageal cancer Neg Hx    Pancreatic cancer Neg Hx     ALLERGIES:  has no known allergies.  MEDICATIONS:  Current Outpatient Medications  Medication Sig Dispense Refill   baclofen  (LIORESAL ) 10 MG tablet Take 1 tablet (10 mg total) by mouth 2 (two) times daily. 30 each 0  carvedilol  (COREG ) 25 MG tablet Take 1 tablet (25 mg total) by mouth 2 (two) times daily. 60 tablet 11   furosemide  (LASIX ) 20 MG tablet TAKE 1 TABLET BY MOUTH DAILY AS NEEDED FOR EDEMA.PLEASE CALL TO SCHEDULE OVERDUE APPT WITH DR KELLEY 30 tablet 0   hydroxyurea  (HYDREA ) 500 MG capsule Take 2 capsules (1,000 mg total) by mouth 2 (two) times daily. 120 capsule 3   lisinopril  (ZESTRIL ) 20 MG tablet Take 1 tablet (20 mg total) by mouth daily. 90 tablet 3   lovastatin  (MEVACOR ) 40 MG tablet TAKE 1 TABLET BY MOUTH EVERY DAY 90 tablet 3   tadalafil  (CIALIS ) 20 MG tablet Take 0.5-1 tablets (10-20 mg total) by mouth every other day as needed for erectile dysfunction. 10 tablet 11   tamsulosin  (FLOMAX ) 0.4 MG CAPS capsule TAKE 1 CAPSULE BY MOUTH EVERY DAY 90 capsule 1   tiZANidine  (ZANAFLEX ) 2 MG tablet Take 1 tablet (2 mg total) by mouth every 6 (six) hours as needed for muscle spasms. 30 tablet 0   No current facility-administered medications for this [name].    REVIEW OF SYSTEMS:   Constitutional: ( - ) fevers, ( - )  chills , ( - ) night sweats Eyes: ( - ) blurriness of vision, ( - ) double vision, ( - ) watery eyes Ears, nose, mouth, throat, and face: ( - ) mucositis, ( - ) sore throat Respiratory: ( - ) cough, ( - ) dyspnea, ( - ) wheezes Cardiovascular: ( - ) palpitation, ( - ) chest discomfort, ( - ) lower extremity swelling Gastrointestinal:  ( - ) nausea, ( - ) heartburn, ( - ) change in bowel habits Skin: ( - ) abnormal skin  rashes Lymphatics: ( - ) new lymphadenopathy, ( - ) easy bruising Neurological: ( - ) numbness, ( - ) tingling, ( - ) new weaknesses Behavioral/Psych: ( - ) mood change, ( - ) new changes  All other systems were reviewed with the patient and are negative.  PHYSICAL EXAMINATION: ECOG PERFORMANCE STATUS: 0 - Asymptomatic  There were no vitals filed for this [name].   There were no vitals filed for this [name].    GENERAL: well appearing male in NAD  SKIN: skin color, texture, turgor are normal, no rashes or significant lesions EYES: conjunctiva are pink and non-injected, sclera clear LUNGS: clear to auscultation and percussion with normal breathing effort HEART: regular rate & rhythm and no murmurs and no lower extremity edema Musculoskeletal: no cyanosis of digits and no clubbing  PSYCH: alert & oriented x 3, fluent speech NEURO: no focal motor/sensory deficits  LABORATORY DATA:  I have reviewed the data as listed    Latest Ref Rng & Units 07/08/2024    1:56 PM 06/08/2024    4:25 PM 05/27/2024    1:41 PM  CBC  WBC 4.0 - 10.5 K/uL 5.5  4.8  4.6   Hemoglobin 13.0 - 17.0 g/dL 8.3  9.6  9.6   Hematocrit 39.0 - 52.0 % 25.4  29.8  29.4   Platelets 150 - 400 K/uL 324  281.0  402        Latest Ref Rng & Units 07/08/2024    1:56 PM 06/08/2024    4:25 PM 05/27/2024    1:41 PM  CMP  Glucose 70 - 99 mg/dL 91  75  90   BUN 8 - 23 mg/dL 21  26  26    Creatinine 0.61 - 1.24 mg/dL 8.65  8.60  8.47  Sodium 135 - 145 mmol/L 142  139  140   Potassium 3.5 - 5.1 mmol/L 4.5  5.1  4.3   Chloride 98 - 111 mmol/L 108  104  108   CO2 22 - 32 mmol/L 29  29  26    Calcium 8.9 - 10.3 mg/dL 9.6  89.9  9.6   Total Protein 6.5 - 8.1 g/dL 6.7  7.2  7.3   Total Bilirubin 0.0 - 1.2 mg/dL 0.6  0.6  0.8   Alkaline Phos 38 - 126 U/L 41  39  47   AST 15 - 41 U/L 14  17  13    ALT 0 - 44 U/L 10  11  7       PATHOLOGY: Collected Date: 11/20/2009   Received Date: 11/20/2009    FINAL DIAGNOSIS    Bone  Marrow, Aspirate,Biopsy, and Clot, left iliac crest :   - HYPERCELLULAR MARROW WITH FEATURES OF CHRONIC MYELOPROLIFERATIVE DISORDER.   - SEE COMMENT.   PERIPHERAL BLOOD:   - THROMBOCYTOSIS.   ASSESSMENT & PLAN Dillon [name] is a 78 y.o. male who [name] for a follow up for JAK2 positive essential thrombocythemia.   #GJX7 Essential Thrombocythemia --Labs today reviewed with patient. WBC 4.6, Hgb 9.6, MCV 117.6, Plt 402. Creatinine mildly elevated. LFTs normal.  --Currently taking ASA 81 mg daily --Currently taking Hydrea  1000 mg BID. Recommend to continue current dose.  --RTC in 6 weeks for lab check and 12 weeks for labs/follow up.   # Anemia -- Patient has macrocytosis secondary to his hydroxyurea .  Platelets are under good control but hemoglobin is approximately 10. -- Appears to been an acute drop from 12 noted earlier this year. -- nutritional labs show no evidence of iron deficiency.   No orders of the defined types were placed in this encounter.  All questions were answered. The patient knows to call the clinic with any problems, questions or concerns.  I have spent a total of 30 minutes minutes of face-to-face and non-face-to-face time, preparing to see the patient, performing a medically appropriate examination, counseling and educating the patient, ordering medications, documenting clinical information in the electronic health record, and care coordination.   Norleen IVAR Kidney, MD Department of Hematology/Oncology [facility] Phone: [phone] Pager: [phone] Email: [email or web address]

## 2024-09-11 ENCOUNTER — Emergency Department (HOSPITAL_COMMUNITY)
Admission: EM | Admit: 2024-09-11 | Discharge: 2024-09-12 | Disposition: A | Attending: Emergency Medicine | Admitting: Emergency Medicine

## 2024-09-11 ENCOUNTER — Encounter (HOSPITAL_COMMUNITY): Payer: Self-pay | Admitting: Emergency Medicine

## 2024-09-11 DIAGNOSIS — R42 Dizziness and giddiness: Secondary | ICD-10-CM | POA: Insufficient documentation

## 2024-09-11 DIAGNOSIS — R531 Weakness: Secondary | ICD-10-CM | POA: Insufficient documentation

## 2024-09-11 DIAGNOSIS — D649 Anemia, unspecified: Secondary | ICD-10-CM | POA: Insufficient documentation

## 2024-09-11 DIAGNOSIS — N189 Chronic kidney disease, unspecified: Secondary | ICD-10-CM | POA: Diagnosis not present

## 2024-09-11 DIAGNOSIS — R509 Fever, unspecified: Secondary | ICD-10-CM | POA: Insufficient documentation

## 2024-09-11 LAB — URINALYSIS, ROUTINE W REFLEX MICROSCOPIC
Bacteria, UA: NONE SEEN
Bilirubin Urine: NEGATIVE
Glucose, UA: NEGATIVE mg/dL
Ketones, ur: NEGATIVE mg/dL
Leukocytes,Ua: NEGATIVE
Nitrite: NEGATIVE
Protein, ur: NEGATIVE mg/dL
Specific Gravity, Urine: 1.02 (ref 1.005–1.030)
pH: 5 (ref 5.0–8.0)

## 2024-09-11 LAB — RESP PANEL BY RT-PCR (RSV, FLU A&B, COVID)  RVPGX2
Influenza A by PCR: NEGATIVE
Influenza B by PCR: NEGATIVE
Resp Syncytial Virus by PCR: NEGATIVE
SARS Coronavirus 2 by RT PCR: NEGATIVE

## 2024-09-11 LAB — BASIC METABOLIC PANEL WITH GFR
Anion gap: 10 (ref 5–15)
BUN: 18 mg/dL (ref 8–23)
CO2: 24 mmol/L (ref 22–32)
Calcium: 9.1 mg/dL (ref 8.9–10.3)
Chloride: 101 mmol/L (ref 98–111)
Creatinine, Ser: 1.33 mg/dL — ABNORMAL HIGH (ref 0.61–1.24)
GFR, Estimated: 54 mL/min — ABNORMAL LOW
Glucose, Bld: 101 mg/dL — ABNORMAL HIGH (ref 70–99)
Potassium: 4 mmol/L (ref 3.5–5.1)
Sodium: 135 mmol/L (ref 135–145)

## 2024-09-11 LAB — CBG MONITORING, ED: Glucose-Capillary: 132 mg/dL — ABNORMAL HIGH (ref 70–99)

## 2024-09-11 LAB — CBC
HCT: 30.6 % — ABNORMAL LOW (ref 39.0–52.0)
Hemoglobin: 9.6 g/dL — ABNORMAL LOW (ref 13.0–17.0)
MCH: 38.6 pg — ABNORMAL HIGH (ref 26.0–34.0)
MCHC: 31.4 g/dL (ref 30.0–36.0)
MCV: 122.9 fL — ABNORMAL HIGH (ref 80.0–100.0)
Platelets: 343 K/uL (ref 150–400)
RBC: 2.49 MIL/uL — ABNORMAL LOW (ref 4.22–5.81)
RDW: 13.9 % (ref 11.5–15.5)
WBC: 7.5 K/uL (ref 4.0–10.5)
nRBC: 0 % (ref 0.0–0.2)

## 2024-09-11 LAB — TROPONIN T, HIGH SENSITIVITY: Troponin T High Sensitivity: 15 ng/L (ref 0–19)

## 2024-09-11 MED ORDER — ACETAMINOPHEN 325 MG PO TABS
650.0000 mg | ORAL_TABLET | Freq: Once | ORAL | Status: AC
  Administered 2024-09-11: 650 mg via ORAL
  Filled 2024-09-11: qty 2

## 2024-09-11 NOTE — ED Provider Triage Note (Signed)
 Emergency Medicine Provider Triage Evaluation Note  Dillon [name] , a 78 y.o. male  was evaluated in triage.  Pt complains of weakness, fever.  Patient is accompanied by sister who provides collateral information, return from [place]  earlier today and when patient went to get out of the car he was nearly too weak to stand.  He denies dizziness/gait instability but just describes weakness in his legs.  Found to have a fever here today, denies chest pain or shortness of breath, no known sick contacts.  No syncope.  Review of Systems  Positive: As above Negative: As above  Physical Exam  BP (!) 161/75 (BP Location: Right Arm)   Pulse 91   Temp (!) 100.8 F (38.2 C)   Resp 16   SpO2 100%  Gen:   Awake, no distress   Resp:  Normal effort  MSK:   Moves extremities without difficulty  Other:  No focal neurologic deficits  Medical Decision Making  Medically screening exam initiated at 7:28 PM.  Appropriate orders placed.  Dillon [name] was informed that the remainder of the evaluation will be completed by another provider, this initial triage assessment does not replace that evaluation, and the importance of remaining in the ED until their evaluation is complete.     Dillon [name], [place] 09/11/24 1929

## 2024-09-11 NOTE — ED Triage Notes (Signed)
 Pt has new onset weakness that started today. Hainv trouble standing due to weakness. Denies dizziness or LOC or numbness. Denies cough or SOB.

## 2024-09-12 ENCOUNTER — Emergency Department (HOSPITAL_COMMUNITY)

## 2024-09-12 NOTE — ED Provider Notes (Signed)
 " [place] EMERGENCY DEPARTMENT AT [facility] Provider Note   CSN: [id number] Arrival date & time: 09/11/24  1858     Patient presents with: Weakness   Dillon [name] is a 78 y.o. male.    Weakness    Patient has a history of hyperlipidemia migraines, thrombocytosis, left ventricular hypertrophy, chronic kidney disease.  Patient reports driving to [place]  for funeral.  They stopped at a gas station.  As patient was trying to get out he felt weak and off balance.  Triage notes indicate patient was feeling too weak to stand.  Patient however tells me now he is feeling more off balance as if he was unsteady.  He has not had any coughing or sore throat.  No vomiting or diarrhea.  Prior to Admission medications  Medication Sig Start Date End Date Taking? Authorizing Provider  baclofen  (LIORESAL ) 10 MG tablet Take 1 tablet (10 mg total) by mouth 2 (two) times daily. 10/31/23   Johnie Flaming A, NP  carvedilol  (COREG ) 25 MG tablet Take 1 tablet (25 mg total) by mouth 2 (two) times daily. 10/28/23 10/27/24  Norleen Lynwood ORN, MD  furosemide  (LASIX ) 20 MG tablet TAKE 1 TABLET BY MOUTH DAILY AS NEEDED FOR EDEMA.PLEASE CALL TO SCHEDULE OVERDUE APPT WITH DR CHARLEEN 02/18/19   Barrett, Shona MATSU, PA-C  hydroxyurea  (HYDREA ) 500 MG capsule Take 2 capsules (1,000 mg total) by mouth 2 (two) times daily. 03/04/24   Federico Norleen ONEIDA MADISON, MD  lisinopril  (ZESTRIL ) 20 MG tablet Take 1 tablet (20 mg total) by mouth daily. 05/28/24   Norleen Lynwood ORN, MD  lovastatin  (MEVACOR ) 40 MG tablet TAKE 1 TABLET BY MOUTH EVERY DAY 02/12/24   Norleen Lynwood ORN, MD  tadalafil  (CIALIS ) 20 MG tablet Take 0.5-1 tablets (10-20 mg total) by mouth every other day as needed for erectile dysfunction. 01/31/23   Norleen Lynwood ORN, MD  tamsulosin  (FLOMAX ) 0.4 MG CAPS capsule TAKE 1 CAPSULE BY MOUTH EVERY DAY 06/09/24   Norleen Lynwood ORN, MD  tiZANidine  (ZANAFLEX ) 2 MG tablet Take 1 tablet (2 mg total) by mouth every 6 (six) hours as needed  for muscle spasms. 01/02/21   Norleen Lynwood ORN, MD    Allergies: Patient has no known allergies.    Review of Systems  Neurological:  Positive for weakness.    Updated Vital Signs BP (!) 146/97   Pulse 72   Temp 98.4 F (36.9 C)   Resp 17   SpO2 99%   Physical Exam Vitals and nursing note reviewed.  Constitutional:      General: He is not in acute distress.    Appearance: He is well-developed.  HENT:     Head: Normocephalic and atraumatic.     Right Ear: External ear normal.     Left Ear: External ear normal.  Eyes:     General: No visual field deficit or scleral icterus.       Right eye: No discharge.        Left eye: No discharge.     Conjunctiva/sclera: Conjunctivae normal.  Neck:     Trachea: No tracheal deviation.  Cardiovascular:     Rate and Rhythm: Normal rate and regular rhythm.  Pulmonary:     Effort: Pulmonary effort is normal. No respiratory distress.     Breath sounds: Normal breath sounds. No stridor. No wheezing or rales.  Abdominal:     General: Bowel sounds are normal. There is no distension.  Palpations: Abdomen is soft.     Tenderness: There is no abdominal tenderness. There is no guarding or rebound.  Musculoskeletal:        General: No tenderness.     Cervical back: Neck supple.  Skin:    General: Skin is warm and dry.     Findings: No rash.  Neurological:     Mental Status: He is alert and oriented to person, place, and time.     Cranial Nerves: No cranial nerve deficit, dysarthria or facial asymmetry.     Sensory: No sensory deficit.     Motor: No abnormal muscle tone, seizure activity or pronator drift.     Coordination: Coordination normal.     Comments:  able to hold both legs off bed for 5 seconds, sensation intact in all extremities,  no left or right sided neglect, normal finger-nose exam bilaterally, no nystagmus noted, gait unsteady when ambulating   Psychiatric:        Mood and Affect: Mood normal.     (all labs ordered are  listed, but only abnormal results are displayed) Labs Reviewed  CBC - Abnormal; Notable for the following components:      Result Value   RBC 2.49 (*)    Hemoglobin 9.6 (*)    HCT 30.6 (*)    MCV 122.9 (*)    MCH 38.6 (*)    All other components within normal limits  BASIC METABOLIC PANEL WITH GFR - Abnormal; Notable for the following components:   Glucose, Bld 101 (*)    Creatinine, Ser 1.33 (*)    GFR, Estimated 54 (*)    All other components within normal limits  URINALYSIS, ROUTINE W REFLEX MICROSCOPIC - Abnormal; Notable for the following components:   Hgb urine dipstick SMALL (*)    All other components within normal limits  CBG MONITORING, ED - Abnormal; Notable for the following components:   Glucose-Capillary 132 (*)    All other components within normal limits  RESP PANEL BY RT-PCR (RSV, FLU A&B, COVID)  [id number]  TROPONIN T, HIGH SENSITIVITY    EKG: EKG Interpretation Date/Time:  Saturday September 11 2024 19:44:15 EST Ventricular Rate:  85 PR Interval:  190 QRS Duration:  90 QT Interval:  352 QTC Calculation: 418 R Axis:   37  Text Interpretation: Normal sinus rhythm Left ventricular hypertrophy with repolarization abnormality ( Cornell product ) ST/T changes new since 2018 Confirmed by Freddi Hamilton [phone]) on 09/12/2024 1:18:27 PM  Radiology: MR BRAIN WO CONTRAST Result Date: 09/12/2024 EXAM: MRI BRAIN WITHOUT CONTRAST 09/12/2024 01:16:56 PM TECHNIQUE: Multiplanar multisequence MRI of the head/brain was performed without the administration of intravenous contrast. COMPARISON: None available. CLINICAL HISTORY: Neuro deficit, acute, stroke suspected. FINDINGS: BRAIN AND VENTRICLES: No acute infarct. No intracranial hemorrhage. No mass. No midline shift. No hydrocephalus. Scattered and confluent T2 and FLAIR hyperintensity in the periventricular and subcortical white matter suggestive of moderate chronic microvascular ischemic changes. There are mild age-related  changes of parenchymal volume loss. The sella is unremarkable. Normal flow voids. ORBITS: Bilateral lens replacements. SINUSES AND MASTOIDS: Mild mucosal thickening in the ethmoid sinuses and right maxillary sinus. BONES AND SOFT TISSUES: Normal marrow signal. No acute soft tissue abnormality. Degenerative changes in the visualized upper cervical spine. IMPRESSION: 1. No acute intracranial abnormality. 2. Moderate chronic microvascular ischemic changes. Electronically signed by: Donnice Mania MD 09/12/2024 01:29 PM EST RP Workstation: [id number]   DG Chest 2 View Result Date: 09/12/2024 CLINICAL DATA:  Fever.  Weakness.  EXAM: CHEST - 2 VIEW COMPARISON:  06/08/2024 FINDINGS: The heart size and mediastinal contours are within normal limits. Mild scarring again seen in left lung base. The lungs are otherwise clear. The visualized skeletal structures are unremarkable. IMPRESSION: No active cardiopulmonary disease. Electronically Signed   By: Norleen DELENA Kil M.D.   On: 09/12/2024 06:11     Procedures   Medications Ordered in the ED  acetaminophen  (TYLENOL ) tablet 650 mg (650 mg Oral Given 09/11/24 1941)    Clinical Course as of 09/12/24 1400  Sun Sep 12, 2024  1117 DG Chest 2 View Negative for pna [JK]  1117 CBC(!) Anemia stable [JK]  1117 Urinalysis, Routine w reflex microscopic -Urine, Clean Catch(!) Nl  [JK]  1117 Resp panel by RT-PCR (RSV, Flu A&B, Covid) Anterior Nasal Swab Negative  [JK]  1118 Troponin T, High Sensitivity Nl  [JK]  1346 MRI negative for acute stroke [JK]    Clinical Course User Index [JK] Randol Simmonds, MD                                 Medical Decision Making Amount and/or Complexity of Data Reviewed Labs:  Decision-making details documented in ED Course. Radiology: ordered. Decision-making details documented in ED Course.   Patient presented with complaints of weakness dizziness.  Initially reported more generalized weakness although during my history he  reported more of feeling off balance.  Patient's initial labs ordered by the triage provider do not show any signs of anemia or dehydration.  His urinalysis does not suggest infection.  His chest x-ray did not show pneumonia.  His COVID flu RSV were negative.  Patient is anemic but this is unchanged from baseline.  On my exam he does appear somewhat unsteady.  I am concerned about the possibility of occult CVA.  Will order MRI to evaluate further  MRI does not show any acute abnormality.  No evidence of stroke.  It is possible patient may have some type of viral illness that was causing his weakness and fatigue earlier.  Patient has been able to walk to the bathroom without difficulty.  Evaluation and diagnostic testing in the emergency department does not suggest an emergent condition requiring admission or immediate intervention beyond what has been performed at this time.  The patient is safe for discharge and has been instructed to return immediately for worsening symptoms, change in symptoms or any other concerns.     Final diagnoses:  Weakness    ED Discharge Orders     None          Randol Simmonds, MD 09/12/24 1400  "

## 2024-09-12 NOTE — Discharge Instructions (Signed)
 The test today did not show any signs of obvious infection.  The brain MRI did not show any signs of stroke.  Follow-up with your doctor next week to be rechecked as we discussed.  Return to the ED as needed for worsening or recurrent symptoms

## 2024-10-15 ENCOUNTER — Ambulatory Visit: Payer: Medicare (Managed Care)

## 2024-10-15 VITALS — Ht 69.0 in | Wt 170.0 lb

## 2024-10-15 DIAGNOSIS — Z0000 Encounter for general adult medical examination without abnormal findings: Secondary | ICD-10-CM

## 2024-10-15 NOTE — Progress Notes (Signed)
 "  Chief Complaint  Patient presents with   Medicare Wellness     Subjective:   SHEM PLEMMONS is a 79 y.o. male who presents for a Medicare Annual Wellness Visit.  Visit info / Clinical Intake: Medicare Wellness Visit Type:: Subsequent Annual Wellness Visit Persons participating in visit and providing information:: patient Medicare Wellness Visit Mode:: Telephone If telephone:: video declined Since this visit was completed virtually, some vitals may be partially provided or unavailable. Missing vitals are due to the limitations of the virtual format.: Documented vitals are patient reported If Telephone or Video please confirm:: I connected with patient using audio/video enable telemedicine. I verified patient identity with two identifiers, discussed telehealth limitations, and patient agreed to proceed. Patient Location:: home Provider Location:: office Interpreter Needed?: No Pre-visit prep was completed: yes AWV questionnaire completed by patient prior to visit?: no Living arrangements:: with family/others Patient's Overall Health Status Rating: very good Typical amount of pain: none Does pain affect daily life?: no Are you currently prescribed opioids?: no  Dietary Habits and Nutritional Risks How many meals a day?: 2 Eats fruit and vegetables daily?: yes Most meals are obtained by: preparing own meals; eating out In the last 2 weeks, have you had any of the following?: none Diabetic:: no  Functional Status Activities of Daily Living (to include ambulation/medication): Independent Ambulation: Independent Medication Administration: Independent Home Management (perform basic housework or laundry): Independent Manage your own finances?: yes Primary transportation is: driving Concerns about vision?: no *vision screening is required for WTM* Concerns about hearing?: no  Fall Screening Falls in the past year?: 1 (passed out) Number of falls in past year: 0 Was there  an injury with Fall?: 0 Fall Risk Category Calculator: 1 Patient Fall Risk Level: Low Fall Risk  Fall Risk Patient at Risk for Falls Due to: Medication side effect Fall risk Follow up: Falls prevention discussed; Education provided; Falls evaluation completed  Home and Transportation Safety: All rugs have non-skid backing?: N/A, no rugs All stairs or steps have railings?: yes Grab bars in the bathtub or shower?: yes Have non-skid surface in bathtub or shower?: yes Good home lighting?: yes Regular seat belt use?: yes Hospital stays in the last year:: no  Cognitive Assessment Difficulty concentrating, remembering, or making decisions? : no Will 6CIT or Mini Cog be Completed: yes What year is it?: 0 points What month is it?: 0 points Give patient an address phrase to remember (5 components): [address] [place] About what time is it?: 0 points Count backwards from 20 to 1: 0 points Say the months of the year in reverse: 0 points Repeat the address phrase from earlier: 4 points 6 CIT Score: 4 points  Advance Directives (For Healthcare) Does Patient Have a Medical Advance Directive?: Yes Does patient want to make changes to medical advance directive?: No - Patient declined Type of Advance Directive: Healthcare Power of [place]; Living will Copy of Healthcare Power of Attorney in Chart?: No - copy requested Copy of Living Will in Chart?: No - copy requested  Reviewed/Updated  Reviewed/Updated: Reviewed All (Medical, Surgical, Family, Medications, Allergies, Care Teams, Patient Goals)    Allergies (verified) Patient has no known allergies.   Current Medications (verified) Outpatient Encounter Medications as of 10/15/2024  Medication Sig   baclofen  (LIORESAL ) 10 MG tablet Take 1 tablet (10 mg total) by mouth 2 (two) times daily.   carvedilol  (COREG ) 25 MG tablet Take 1 tablet (25 mg total) by mouth 2 (two) times daily.  furosemide  (LASIX ) 20 MG tablet TAKE 1 TABLET BY  MOUTH DAILY AS NEEDED FOR EDEMA.PLEASE CALL TO SCHEDULE OVERDUE APPT WITH DR CHARLEEN   hydroxyurea  (HYDREA ) 500 MG capsule Take 2 capsules (1,000 mg total) by mouth 2 (two) times daily.   lisinopril  (ZESTRIL ) 20 MG tablet Take 1 tablet (20 mg total) by mouth daily.   lovastatin  (MEVACOR ) 40 MG tablet TAKE 1 TABLET BY MOUTH EVERY DAY   tadalafil  (CIALIS ) 20 MG tablet Take 0.5-1 tablets (10-20 mg total) by mouth every other day as needed for erectile dysfunction.   tamsulosin  (FLOMAX ) 0.4 MG CAPS capsule TAKE 1 CAPSULE BY MOUTH EVERY DAY   tiZANidine  (ZANAFLEX ) 2 MG tablet Take 1 tablet (2 mg total) by mouth every 6 (six) hours as needed for muscle spasms.   No facility-administered encounter medications on file as of 10/15/2024.    History: Past Medical History:  Diagnosis Date   Arthritis    right shoulder   Bladder neck obstruction 04/18/2011   CKD (chronic kidney disease), stage III (HCC) 09/12/2015   ERECTILE DYSFUNCTION 11/15/2008   Essential hypertension, benign 04/28/2013   FATIGUE 11/15/2008   GERD 11/15/2008   History of adenomatous polyp of colon 05/06/2014   2010   HYPERLIPIDEMIA 11/15/2008   Impaired glucose tolerance 04/18/2011   LEG PAIN, LEFT 11/05/2010   LVH (left ventricular hypertrophy) 04/27/2012   MIGRAINE, COMMON 08/06/2010   Nocturia 12/19/2008   Polycythemia 04/27/2012   Swelling of limb 11/05/2010   THROMBOCYTOSIS 12/19/2008   myleoproliferative disorder   Past Surgical History:  Procedure Laterality Date   APPENDECTOMY     CARDIAC CATHETERIZATION N/A 09/05/2015   Procedure: Left Heart Cath and Coronary Angiography;  Surgeon: Debby DELENA Sor, MD;  Location: [facility];  Service: Cardiovascular;  Laterality: N/A;   CIRCUMCISION     30+yrs ago   COLONOSCOPY     POLYPECTOMY     s/p esophageal dilation     TONSILLECTOMY     UPPER GASTROINTESTINAL ENDOSCOPY     Family History  Problem Relation Age of Onset   Hypertension Mother    Cancer Father        prostate  cancer   Prostate cancer Father    Hypertension Sister    Hypertension Brother    Colon cancer Neg Hx    Rectal cancer Neg Hx    Stomach cancer Neg Hx    Esophageal cancer Neg Hx    Pancreatic cancer Neg Hx    Social History   Occupational History   Occupation: [employer]  Tobacco Use   Smoking status: Former    Current packs/day: 0.00    Average packs/day: 0.5 packs/day for 12.0 years (6.0 ttl pk-yrs)    Types: Cigarettes    Start date: 09/16/1958    Quit date: 09/16/1970    Years since quitting: 54.1   Smokeless tobacco: Never   Tobacco comments:    hasn't smoked in 46 years;   Vaping Use   Vaping status: Never Used  Substance and Sexual Activity   Alcohol use: Yes    Alcohol/week: 0.0 standard drinks of alcohol    Comment: occasional;    Drug use: No   Sexual activity: Not on file   Tobacco Counseling Counseling given: Not Answered Tobacco comments: hasn't smoked in 46 years;   SDOH Screenings   Food Insecurity: No Food Insecurity (10/15/2024)  Housing: Unknown (10/15/2024)  Transportation Needs: No Transportation Needs (10/15/2024)  Utilities: Not At Risk (10/15/2024)  Alcohol Screen: Low Risk (10/15/2024)  Depression (PHQ2-9): Low Risk (10/15/2024)  Financial Resource Strain: Low Risk (10/15/2024)  Physical Activity: Insufficiently Active (10/15/2024)  Social Connections: Moderately Isolated (10/15/2024)  Stress: No Stress Concern Present (10/15/2024)  Tobacco Use: Medium Risk (10/15/2024)  Health Literacy: Adequate Health Literacy (10/15/2024)   See flowsheets for full screening details  Depression Screen PHQ 2 & 9 Depression Scale- Over the past 2 weeks, how often have you been bothered by any of the following problems? Little interest or pleasure in doing things: 0 Feeling down, depressed, or hopeless (PHQ Adolescent also includes...irritable): 0 PHQ-2 Total Score: 0 Trouble falling or staying asleep, or sleeping too much: 0 Feeling tired or  having little energy: 0 Poor appetite or overeating (PHQ Adolescent also includes...weight loss): 0 Feeling bad about yourself - or that you are a failure or have let yourself or your family down: 0 Trouble concentrating on things, such as reading the newspaper or watching television (PHQ Adolescent also includes...like school work): 0 Moving or speaking so slowly that other people could have noticed. Or the opposite - being so fidgety or restless that you have been moving around a lot more than usual: 0 Thoughts that you would be better off dead, or of hurting yourself in some way: 0 PHQ-9 Total Score: 0 If you checked off any problems, how difficult have these problems made it for you to do your work, take care of things at home, or get along with other people?: Not difficult at all  Depression Treatment Depression Interventions/Treatment : [id number] Score <4 Follow-up Not Indicated     Goals Addressed               This Visit's Progress     stay healthy (pt-stated)               Objective:    Today's Vitals   10/15/24 1350  Weight: 170 lb (77.1 kg)  Height: 5' 9 (1.753 m)   Body mass index is 25.1 kg/m.  Hearing/Vision screen Vision Screening - Comments:: Regular eye exams, [facility] Immunizations and Health Maintenance Health Maintenance  Topic Date Due   Medicare Annual Wellness (AWV)  10/15/2025   DTaP/Tdap/Td (3 - Td or Tdap) 05/13/2029   Pneumococcal Vaccine: 50+ Years  Completed   Influenza Vaccine  Completed   Zoster Vaccines- Shingrix  Completed   Meningococcal B Vaccine  Aged Out   Colonoscopy  Discontinued   COVID-19 Vaccine  Discontinued   Hepatitis C Screening  Discontinued        Assessment/Plan:  This is a routine wellness examination for [place].  Patient Care Team: Norleen Lynwood ORN, MD as PCP - General (Internal Medicine) Jeffrie Oneil BROCKS, MD as PCP - Cardiology (Cardiology) Debrah Lamar BIRCH, MD (Inactive) as Consulting Physician  (Gastroenterology) Octavia Bruckner, MD as Consulting Physician (Ophthalmology) Octavia, Charlie Hamilton, MD as Consulting Physician (Ophthalmology)  I have personally reviewed and noted the following in the patients chart:   Medical and social history Use of alcohol, tobacco or illicit drugs  Current medications and supplements including opioid prescriptions. Functional ability and status Nutritional status Physical activity Advanced directives List of other physicians Hospitalizations, surgeries, and ER visits in previous 12 months Vitals Screenings to include cognitive, depression, and falls Referrals and appointments  No orders of the defined types were placed in this encounter.  In addition, I have reviewed and discussed with patient certain preventive protocols, quality metrics, and best practice recommendations. A written personalized  care plan for preventive services as well as general preventive health recommendations were provided to patient.   Ardella FORBES Dawn, LPN   [id number]/[id number]   Return in 1 year (on 10/15/2025).  After Visit Summary: (Pick Up) Due to this being a telephonic visit, with patients personalized plan was offered to patient and patient has requested to Pick up at office.  Nurse Notes: No voiced or noted concerns at this time  "

## 2024-11-25 ENCOUNTER — Ambulatory Visit: Admitting: Cardiology

## 2025-10-21 ENCOUNTER — Ambulatory Visit
